# Patient Record
Sex: Male | Born: 1990 | Race: White | Hispanic: No | Marital: Married | Smoking: Former smoker
Health system: Southern US, Community
[De-identification: ages and names within clinical notes are randomized; demographics above are authoritative.]

## PROBLEM LIST (undated history)

## (undated) DIAGNOSIS — F329 Major depressive disorder, single episode, unspecified: Secondary | ICD-10-CM

## (undated) DIAGNOSIS — K219 Gastro-esophageal reflux disease without esophagitis: Secondary | ICD-10-CM

## (undated) DIAGNOSIS — N289 Disorder of kidney and ureter, unspecified: Secondary | ICD-10-CM

## (undated) DIAGNOSIS — F32A Depression, unspecified: Secondary | ICD-10-CM

## (undated) DIAGNOSIS — N200 Calculus of kidney: Secondary | ICD-10-CM

## (undated) DIAGNOSIS — Z87442 Personal history of urinary calculi: Secondary | ICD-10-CM

## (undated) HISTORY — DX: Calculus of kidney: N20.0

## (undated) HISTORY — DX: Major depressive disorder, single episode, unspecified: F32.9

## (undated) HISTORY — DX: Depression, unspecified: F32.A

## (undated) HISTORY — DX: Gastro-esophageal reflux disease without esophagitis: K21.9

---
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
## 2008-02-01 ENCOUNTER — Emergency Department (HOSPITAL_COMMUNITY): Admission: EM | Admit: 2008-02-01 | Discharge: 2008-02-01 | Payer: Self-pay | Admitting: Emergency Medicine

## 2010-11-11 ENCOUNTER — Emergency Department (HOSPITAL_COMMUNITY): Admission: EM | Admit: 2010-11-11 | Discharge: 2010-05-31 | Payer: Self-pay | Admitting: Emergency Medicine

## 2011-04-11 ENCOUNTER — Emergency Department (HOSPITAL_COMMUNITY)
Admission: EM | Admit: 2011-04-11 | Discharge: 2011-04-11 | Disposition: A | Discharge status: Home / Self Care | Payer: No Typology Code available for payment source | Attending: Emergency Medicine | Admitting: Emergency Medicine

## 2011-04-11 DIAGNOSIS — IMO0002 Reserved for concepts with insufficient information to code with codable children: Secondary | ICD-10-CM | POA: Insufficient documentation

## 2011-04-11 DIAGNOSIS — T148XXA Other injury of unspecified body region, initial encounter: Secondary | ICD-10-CM | POA: Insufficient documentation

## 2011-04-11 DIAGNOSIS — Y92410 Unspecified street and highway as the place of occurrence of the external cause: Secondary | ICD-10-CM | POA: Insufficient documentation

## 2011-11-03 ENCOUNTER — Encounter: Payer: Self-pay | Admitting: *Deleted

## 2011-11-03 ENCOUNTER — Emergency Department (HOSPITAL_COMMUNITY)
Admission: EM | Admit: 2011-11-03 | Discharge: 2011-11-03 | Disposition: A | Discharge status: Home / Self Care | Payer: Self-pay | Attending: Emergency Medicine | Admitting: Emergency Medicine

## 2011-11-03 ENCOUNTER — Emergency Department (HOSPITAL_COMMUNITY): Payer: Self-pay

## 2011-11-03 DIAGNOSIS — R0609 Other forms of dyspnea: Secondary | ICD-10-CM | POA: Insufficient documentation

## 2011-11-03 DIAGNOSIS — Z87891 Personal history of nicotine dependence: Secondary | ICD-10-CM | POA: Insufficient documentation

## 2011-11-03 DIAGNOSIS — R072 Precordial pain: Secondary | ICD-10-CM | POA: Insufficient documentation

## 2011-11-03 DIAGNOSIS — R0989 Other specified symptoms and signs involving the circulatory and respiratory systems: Secondary | ICD-10-CM | POA: Insufficient documentation

## 2011-11-03 DIAGNOSIS — J45909 Unspecified asthma, uncomplicated: Secondary | ICD-10-CM | POA: Insufficient documentation

## 2011-11-03 LAB — D-DIMER, QUANTITATIVE: D-Dimer, Quant: 0.4 ug/mL-FEU (ref 0.00–0.48)

## 2011-11-03 NOTE — ED Provider Notes (Signed)
History    20yM with CP. Onset around 0445 today. Woke up from sleep. Substernal pressure with radiation to back. Initially some dyspnea. Symptoms currently resolved. Felt fine when went to bed. Hx of asthma, otherwise healthy. No rash. No trauma. No fever or chills. No cough. No hx of similar symptoms. Denies smoking. Denies drug use.   CSN: [id number] Arrival date & time: 11/03/2011  5:41 AM   None     Chief Complaint  Patient presents with  . Chest Pain    (Consider location/radiation/quality/duration/timing/severity/associated sxs/prior treatment) HPI  Past Medical History  Diagnosis Date  . Asthma     History reviewed. No pertinent past surgical history.  History reviewed. No pertinent family history.  History  Substance Use Topics  . Smoking status: Former Games developer  . Smokeless tobacco: Not on file  . Alcohol Use: Yes      Review of Systems   Review of symptoms negative unless otherwise noted in HPI.   Allergies  Review of patient's allergies indicates no known allergies.  Home Medications  No current outpatient prescriptions on file.  BP 132/83  Pulse 111  Temp(Src) 98.1 F (36.7 C) (Oral)  Resp 18  Ht 5\' 10"  (1.778 m)  Wt 240 lb (108.863 kg)  BMI 34.44 kg/m2  SpO2 100%  Physical Exam  Nursing note and vitals reviewed. Constitutional: He appears well-developed and well-nourished. No distress.  HENT:  Head: Normocephalic and atraumatic.  Eyes: Conjunctivae are normal. Pupils are equal, round, and reactive to light. Right eye exhibits no discharge. Left eye exhibits no discharge. No scleral icterus.  Neck: Neck supple.  Cardiovascular: Normal rate, regular rhythm and normal heart sounds.  Exam reveals no gallop and no friction rub.   No murmur heard. Pulmonary/Chest: Effort normal and breath sounds normal. No respiratory distress. He has no wheezes. He has no rales. He exhibits no tenderness.  Abdominal: Soft. He exhibits no distension. There is  no tenderness.  Musculoskeletal: Normal range of motion. He exhibits no edema and no tenderness.  Neurological: He is alert.  Skin: Skin is warm and dry. No rash noted. No pallor.  Psychiatric: He has a normal mood and affect. His behavior is normal. Thought content normal.    ED Course  Procedures (including critical care time)   Labs Reviewed  D-DIMER, QUANTITATIVE  POCT I-STAT TROPONIN I  I-STAT TROPONIN I   Dg Chest 2 View  11/03/2011  *RADIOLOGY REPORT*  Clinical Data: Chest pain.  CHEST - 2 VIEW  Comparison: None.  Findings: The lungs are well-aerated and clear.  There is no evidence of focal opacification, pleural effusion or pneumothorax.  The heart is normal in size; the mediastinal contour is within normal limits.  No acute osseous abnormalities are seen.  IMPRESSION: No acute cardiopulmonary process seen.  Original Report Authenticated By: Tonia Ghent, M.D.    EKG:  Rhythm: sinus tach Rate: 100 Axis: normal Intervals: normal ST segments: NS ST changes. Inversions in v2/3  1. Chest pain     5:56 AM 20ym with CP and dyspnea, currently resolved. PE considered. Low risk, will dimer. EKG with some nonspecific changes anteriorly. Consider ACS but less likely given young age and no significant risk factors. Possible pneumothorax with hx of asthma. No respiratory distress. Will check CXR. Doubt pneumonia. No prodrome or EKG to suggest myo/pericarditis. Will check trop as screen.  MDM  20yM with CP. Resolved. EKG nondiagnostic. CXR clear. Trop and ddimer wnl. Very low clinical suspicion for ACS. outpt  fu as needed.       Raeford Razor, MD 11/03/11 [phone]

## 2011-11-03 NOTE — ED Notes (Signed)
Pt states cp that woke him from sleep around 0445, pain described as a pressure & had some sob at onset.

## 2013-06-30 ENCOUNTER — Encounter (HOSPITAL_COMMUNITY): Payer: Self-pay | Admitting: *Deleted

## 2013-06-30 ENCOUNTER — Emergency Department (HOSPITAL_COMMUNITY): Payer: BC Managed Care – PPO

## 2013-06-30 ENCOUNTER — Emergency Department (HOSPITAL_COMMUNITY)
Admission: EM | Admit: 2013-06-30 | Discharge: 2013-06-30 | Disposition: A | Discharge status: Home / Self Care | Payer: BC Managed Care – PPO | Attending: Emergency Medicine | Admitting: Emergency Medicine

## 2013-06-30 DIAGNOSIS — K529 Noninfective gastroenteritis and colitis, unspecified: Secondary | ICD-10-CM

## 2013-06-30 DIAGNOSIS — K5289 Other specified noninfective gastroenteritis and colitis: Secondary | ICD-10-CM | POA: Insufficient documentation

## 2013-06-30 DIAGNOSIS — Z87891 Personal history of nicotine dependence: Secondary | ICD-10-CM | POA: Insufficient documentation

## 2013-06-30 DIAGNOSIS — J45909 Unspecified asthma, uncomplicated: Secondary | ICD-10-CM | POA: Insufficient documentation

## 2013-06-30 DIAGNOSIS — R109 Unspecified abdominal pain: Secondary | ICD-10-CM

## 2013-06-30 DIAGNOSIS — R112 Nausea with vomiting, unspecified: Secondary | ICD-10-CM | POA: Insufficient documentation

## 2013-06-30 DIAGNOSIS — R197 Diarrhea, unspecified: Secondary | ICD-10-CM | POA: Insufficient documentation

## 2013-06-30 LAB — CBC WITH DIFFERENTIAL/PLATELET
Lymphocytes Relative: 10 % — ABNORMAL LOW (ref 12–46)
Lymphs Abs: 2.6 10*3/uL (ref 0.7–4.0)
MCHC: 35.6 g/dL (ref 30.0–36.0)
Monocytes Absolute: 2 10*3/uL — ABNORMAL HIGH (ref 0.1–1.0)
Monocytes Relative: 8 % (ref 3–12)
Neutro Abs: 20.9 10*3/uL — ABNORMAL HIGH (ref 1.7–7.7)
Neutrophils Relative %: 80 % — ABNORMAL HIGH (ref 43–77)
Platelets: 262 10*3/uL (ref 150–400)
RDW: 12.2 % (ref 11.5–15.5)
WBC: 26 10*3/uL — ABNORMAL HIGH (ref 4.0–10.5)

## 2013-06-30 LAB — COMPREHENSIVE METABOLIC PANEL
AST: 18 U/L (ref 0–37)
CO2: 28 mEq/L (ref 19–32)
Calcium: 9.8 mg/dL (ref 8.4–10.5)
Chloride: 100 mEq/L (ref 96–112)
Creatinine, Ser: 0.95 mg/dL (ref 0.50–1.35)
Glucose, Bld: 109 mg/dL — ABNORMAL HIGH (ref 70–99)
Total Protein: 8.1 g/dL (ref 6.0–8.3)

## 2013-06-30 LAB — LIPASE, BLOOD: Lipase: 23 U/L (ref 11–59)

## 2013-06-30 MED ORDER — IOHEXOL 300 MG/ML  SOLN
100.0000 mL | Freq: Once | INTRAMUSCULAR | Status: AC | PRN
  Administered 2013-06-30: 100 mL via INTRAVENOUS

## 2013-06-30 MED ORDER — KETOROLAC TROMETHAMINE 30 MG/ML IJ SOLN
30.0000 mg | Freq: Once | INTRAMUSCULAR | Status: AC
  Administered 2013-06-30: 30 mg via INTRAVENOUS
  Filled 2013-06-30: qty 1

## 2013-06-30 MED ORDER — ONDANSETRON HCL 4 MG/2ML IJ SOLN
4.0000 mg | Freq: Once | INTRAMUSCULAR | Status: AC
  Administered 2013-06-30: 4 mg via INTRAVENOUS
  Filled 2013-06-30: qty 2

## 2013-06-30 MED ORDER — IOHEXOL 300 MG/ML  SOLN
50.0000 mL | Freq: Once | INTRAMUSCULAR | Status: AC | PRN
  Administered 2013-06-30: 50 mL via ORAL

## 2013-06-30 MED ORDER — ONDANSETRON 8 MG PO TBDP: ORAL_TABLET | ORAL | Status: DC

## 2013-06-30 NOTE — ED Provider Notes (Signed)
  CSN: [id number]     Arrival date & time 06/30/13  0226 History     First MD Initiated Contact with Patient 06/30/13 0249     Chief Complaint  Patient presents with  . Abdominal Pain  . Abdominal Cramping  . Emesis   (Consider location/radiation/quality/duration/timing/severity/associated sxs/prior Treatment) HPI Comments: Patient woke tonight with abdominal cramping, n/v/d.  The cramping is located in the epigastric region.  No fevers or chills.  No urinary complaints.  No ill contacts.    Patient is a 22 y.o. male presenting with abdominal pain. The history is provided by the patient.  Abdominal Pain This is a new problem. The current episode started 3 to 5 hours ago. The problem occurs constantly. The problem has not changed since onset.Nothing aggravates the symptoms. Nothing relieves the symptoms. He has tried nothing for the symptoms. The treatment provided no relief.    Past Medical History  Diagnosis Date  . Asthma    History reviewed. No pertinent past surgical history. No family history on file. History  Substance Use Topics  . Smoking status: Former Games developer  . Smokeless tobacco: Not on file  . Alcohol Use: No    Review of Systems  All other systems reviewed and are negative.    Allergies  Review of patient's allergies indicates no known allergies.  Home Medications  No current outpatient prescriptions on file. BP 122/65  Pulse 98  Temp(Src) 98.2 F (36.8 C) (Oral)  Ht 5\' 9"  (1.753 m)  Wt 215 lb (97.523 kg)  BMI 31.74 kg/m2  SpO2 98% Physical Exam  Nursing note and vitals reviewed. Constitutional: He is oriented to person, place, and time. He appears well-developed and well-nourished. No distress.  HENT:  Head: Normocephalic and atraumatic.  Mouth/Throat: Oropharynx is clear and moist.  Neck: Normal range of motion. Neck supple.  Cardiovascular: Normal rate, regular rhythm and normal heart sounds.   No murmur heard. Pulmonary/Chest: Effort normal  and breath sounds normal. No respiratory distress. He has no wheezes.  Abdominal: Soft. Bowel sounds are normal. He exhibits no distension and no mass. There is tenderness.  There is ttp in the epigastric region, left, and right upper quadrants.  No rebound or guarding.    Musculoskeletal: Normal range of motion. He exhibits no edema.  Neurological: He is alert and oriented to person, place, and time.  Skin: Skin is warm and dry. He is not diaphoretic.    ED Course   Procedures (including critical care time)  Labs Reviewed  CBC WITH DIFFERENTIAL  COMPREHENSIVE METABOLIC PANEL  LIPASE, BLOOD   No results found. No diagnosis found.  MDM  Patient presents with complaints of abdominal pain, nausea, vomiting, and loose stools.  The abdominal exam is benign, however the workup reveals a wbc of 26k.  He appears clinically well, however due to the degree of leukocytosis, I felt as though a ct scan was indicated to rule any serious intra-abdominal pathology.  This was performed and was negative.  It appears as though this is a gastroenteritis.  Will discharge to home with zofran, return prn.    Geoffery Lyons, MD 06/30/13 [phone]

## 2013-06-30 NOTE — ED Notes (Signed)
Pt complains of abdominal pain w/ cramping. Also pt states he has been vomiting.

## 2013-06-30 NOTE — ED Notes (Signed)
Pt alert & oriented x4, stable gait. Patient given discharge instructions, paperwork & prescription(s). Patient  instructed to stop at the registration desk to finish any additional paperwork. Patient verbalized understanding. Pt left department w/ no further questions. 

## 2014-01-31 ENCOUNTER — Encounter: Payer: Self-pay | Admitting: *Deleted

## 2014-01-31 DIAGNOSIS — K5289 Other specified noninfective gastroenteritis and colitis: Secondary | ICD-10-CM | POA: Insufficient documentation

## 2014-01-31 DIAGNOSIS — R079 Chest pain, unspecified: Secondary | ICD-10-CM | POA: Insufficient documentation

## 2014-02-03 ENCOUNTER — Encounter: Payer: Self-pay | Admitting: Cardiology

## 2014-02-03 ENCOUNTER — Ambulatory Visit (INDEPENDENT_AMBULATORY_CARE_PROVIDER_SITE_OTHER): Payer: BC Managed Care – PPO | Admitting: Cardiology

## 2014-02-03 ENCOUNTER — Encounter: Payer: Self-pay | Admitting: *Deleted

## 2014-02-03 VITALS — BP 138/79 | HR 63 | Ht 69.0 in | Wt 226.8 lb

## 2014-02-03 DIAGNOSIS — R079 Chest pain, unspecified: Secondary | ICD-10-CM

## 2014-02-03 DIAGNOSIS — I10 Essential (primary) hypertension: Secondary | ICD-10-CM

## 2014-02-03 NOTE — Progress Notes (Signed)
     Clinical Summary Ernest Cochran is a 22 y.o.male seen today as a new patient, he is referred for chest pain  1. Chest pain - started approx 10 year ago. Pressure like feeling in midchest, 6-8/10. +SOB + Dizziness. Can feel palpitations at times.  Can occur at rest rarely, typically occurs with exertion. Worst with deep breaths. Pain lasts approx 20 minutes. Occurs 3-4 times a week. Notes increasing frequency over last few months. - nexium started approx 1 week ago without siginficant improvement. - fairly sedentary lifestyle, no high levels exertion. Describes DOE with short distances.  CAD risk factors: Remote tobacco history, now using electronic cigarettes. No drug use.  Mother MI around age 7540s, maternal grandfather with heart troubles    Past Medical History  Diagnosis Date  . Asthma      No Known Allergies   Current Outpatient Prescriptions  Medication Sig Dispense Refill  . ondansetron (ZOFRAN ODT) 8 MG disintegrating tablet 8mg  ODT q4 hours prn nausea  4 tablet  0   No current facility-administered medications for this visit.     No past surgical history on file.   No Known Allergies    No family history on file.   Social History Ernest Cochran reports that he has quit smoking. He does not have any smokeless tobacco history on file. Ernest Cochran reports that he does not drink alcohol.   Review of Systems CONSTITUTIONAL: No weight loss, fever, chills, weakness or fatigue.  HEENT: Eyes: No visual loss, blurred vision, double vision or yellow sclerae.No hearing loss, sneezing, congestion, runny nose or sore throat.  SKIN: No rash or itching.  CARDIOVASCULAR: per HPI RESPIRATORY: per HPI GASTROINTESTINAL: No anorexia, nausea, vomiting or diarrhea. No abdominal pain or blood.  GENITOURINARY: No burning on urination, no polyuria NEUROLOGICAL: No headache, dizziness, syncope, paralysis, ataxia, numbness or tingling in the extremities. No change in bowel or bladder  control.  MUSCULOSKELETAL: No muscle, back pain, joint pain or stiffness.  LYMPHATICS: No enlarged nodes. No history of splenectomy.  PSYCHIATRIC: No history of depression or anxiety.  ENDOCRINOLOGIC: No reports of sweating, cold or heat intolerance. No polyuria or polydipsia.  Marland Kitchen.   Physical Examination p 63 bp 124/70  Wt 226 lbs BMI 33 Gen: resting comfortably, no acute distress HEENT: no scleral icterus, pupils equal round and reactive, no palptable cervical adenopathy,  CV: RRR, no m/r/g, no JVD, no carotid bruits Resp: Clear to auscultation bilaterally GI: abdomen is soft, non-tender, non-distended, normal bowel sounds, no hepatosplenomegaly MSK: extremities are warm, no edema.  Skin: warm, no rash Neuro:  no focal deficits Psych: appropriate affect   Diagnostic Studies 02/03/14 Clinic EKG: NSR, non-spec ST/T changes    Assessment and Plan  1. Chest pain - young patient with fairly strong family history of CAD - symptoms are progressing over the last several years with significant exertional component. Given age fairly low risk of obstructive CAD but he does have some family hx, also potential for possible congenital coronary anomaly or coronary vasospasm.  - will further evaluate with echo and stress echo - check fasting lipid panel and HgbA1c for further risk assessment      Antoine PocheJonathan F. Laykin Rainone, M.D., F.A.C.C.

## 2014-02-03 NOTE — Patient Instructions (Signed)
Your physician recommends that you schedule a follow-up appointment in: 1 month with Dr Wyline MoodBranch  Your physician has requested that you have an echocardiogram. Echocardiography is a painless test that uses sound waves to create images of your heart. It provides your doctor with information about the size and shape of your heart and how well your heart's chambers and valves are working. This procedure takes approximately one hour. There are no restrictions for this procedure.  Your physician has requested that you have a stress echocardiogram. For further information please visit [email or web address]. Please follow instruction sheet as given.   Your physician recommends that you return for lab work in a week. Lipid, Hgba1c

## 2014-02-07 ENCOUNTER — Ambulatory Visit (HOSPITAL_COMMUNITY)
Admission: RE | Admit: 2014-02-07 | Discharge: 2014-02-07 | Disposition: A | Discharge status: Home / Self Care | Payer: Self-pay | Source: Ambulatory Visit | Attending: Cardiology | Admitting: Cardiology

## 2014-02-07 ENCOUNTER — Encounter (HOSPITAL_COMMUNITY): Payer: Self-pay

## 2014-02-07 DIAGNOSIS — I10 Essential (primary) hypertension: Secondary | ICD-10-CM | POA: Insufficient documentation

## 2014-02-07 DIAGNOSIS — R0989 Other specified symptoms and signs involving the circulatory and respiratory systems: Secondary | ICD-10-CM | POA: Insufficient documentation

## 2014-02-07 DIAGNOSIS — Z8249 Family history of ischemic heart disease and other diseases of the circulatory system: Secondary | ICD-10-CM | POA: Insufficient documentation

## 2014-02-07 DIAGNOSIS — F17200 Nicotine dependence, unspecified, uncomplicated: Secondary | ICD-10-CM | POA: Insufficient documentation

## 2014-02-07 DIAGNOSIS — R079 Chest pain, unspecified: Secondary | ICD-10-CM | POA: Insufficient documentation

## 2014-02-07 DIAGNOSIS — R0609 Other forms of dyspnea: Secondary | ICD-10-CM | POA: Insufficient documentation

## 2014-02-07 DIAGNOSIS — R072 Precordial pain: Secondary | ICD-10-CM

## 2014-02-07 DIAGNOSIS — Z6833 Body mass index (BMI) 33.0-33.9, adult: Secondary | ICD-10-CM | POA: Insufficient documentation

## 2014-02-07 NOTE — Progress Notes (Signed)
*  PRELIMINARY RESULTS* Echocardiogram Echocardiogram Stress Test has been performed.  Ernest Cochran 02/07/2014, 11:18 AM

## 2014-02-07 NOTE — Progress Notes (Signed)
Stress Lab Nurses Notes - [facility]  Doris CheadleKenneth W Hoes 02/07/2014 Reason for doing test: Chest Pain and HTN Type of test: Stress Echo Nurse performing test: Parke PoissonPhyllis Billingsly, RN Nuclear Medicine Tech: Not Applicable Echo Tech: Veda Canningindy Rigg MD performing test: S. McDowell/K.Lawrence NP Family MD: Creta LevinStallings Test explained and consent signed: yes IV started: No IV started Symptoms: SOB Treatment/Intervention: None Reason test stopped: fatigue and reached target HR After recovery IV was: NA Patient to return to Nuc. Med at : NA Patient discharged: Home Patient's Condition upon discharge was: stable Comments: During test peak BP 146/56 & HR 179.  Recovery BP 122/73 & HR 99.  Symptoms resolved in recovery. Erskine SpeedBillingsley, Keric Zehren T

## 2014-02-10 ENCOUNTER — Ambulatory Visit (HOSPITAL_COMMUNITY)
Admission: RE | Admit: 2014-02-10 | Discharge: 2014-02-10 | Disposition: A | Discharge status: Home / Self Care | Payer: Self-pay | Source: Ambulatory Visit | Attending: Cardiology | Admitting: Cardiology

## 2014-02-10 DIAGNOSIS — R079 Chest pain, unspecified: Secondary | ICD-10-CM | POA: Insufficient documentation

## 2014-02-10 DIAGNOSIS — I10 Essential (primary) hypertension: Secondary | ICD-10-CM | POA: Insufficient documentation

## 2014-02-10 DIAGNOSIS — Z6832 Body mass index (BMI) 32.0-32.9, adult: Secondary | ICD-10-CM | POA: Insufficient documentation

## 2014-02-10 NOTE — Progress Notes (Signed)
*  PRELIMINARY RESULTS* Echocardiogram 2D Echocardiogram has been performed.  Ernest Cochran 02/10/2014, 1:19 PM

## 2014-03-12 ENCOUNTER — Ambulatory Visit: Payer: Self-pay | Admitting: Cardiology

## 2014-03-12 ENCOUNTER — Encounter: Payer: Self-pay | Admitting: Cardiology

## 2014-05-15 ENCOUNTER — Emergency Department (HOSPITAL_COMMUNITY)
Admission: EM | Admit: 2014-05-15 | Discharge: 2014-05-15 | Disposition: A | Discharge status: Home / Self Care | Payer: Self-pay | Attending: Emergency Medicine | Admitting: Emergency Medicine

## 2014-05-15 ENCOUNTER — Encounter (HOSPITAL_COMMUNITY): Payer: Self-pay | Admitting: Emergency Medicine

## 2014-05-15 DIAGNOSIS — M5380 Other specified dorsopathies, site unspecified: Secondary | ICD-10-CM | POA: Insufficient documentation

## 2014-05-15 DIAGNOSIS — Z87891 Personal history of nicotine dependence: Secondary | ICD-10-CM | POA: Insufficient documentation

## 2014-05-15 DIAGNOSIS — M62830 Muscle spasm of back: Secondary | ICD-10-CM

## 2014-05-15 DIAGNOSIS — Z79899 Other long term (current) drug therapy: Secondary | ICD-10-CM | POA: Insufficient documentation

## 2014-05-15 DIAGNOSIS — M5430 Sciatica, unspecified side: Secondary | ICD-10-CM | POA: Insufficient documentation

## 2014-05-15 DIAGNOSIS — M5432 Sciatica, left side: Secondary | ICD-10-CM

## 2014-05-15 DIAGNOSIS — J45909 Unspecified asthma, uncomplicated: Secondary | ICD-10-CM | POA: Insufficient documentation

## 2014-05-15 MED ORDER — HYDROCODONE-ACETAMINOPHEN 5-325 MG PO TABS
1.0000 | ORAL_TABLET | Freq: Once | ORAL | Status: AC
  Administered 2014-05-15: 1 via ORAL
  Filled 2014-05-15: qty 1

## 2014-05-15 MED ORDER — HYDROCODONE-ACETAMINOPHEN 5-325 MG PO TABS: 2.0000 | ORAL_TABLET | ORAL | Status: DC | PRN

## 2014-05-15 MED ORDER — METHOCARBAMOL 500 MG PO TABS: 500.0000 mg | ORAL_TABLET | Freq: Three times a day (TID) | ORAL | Status: DC | PRN

## 2014-05-15 MED ORDER — IBUPROFEN 600 MG PO TABS: 600.0000 mg | ORAL_TABLET | Freq: Four times a day (QID) | ORAL | Status: DC | PRN

## 2014-05-15 MED ORDER — KETOROLAC TROMETHAMINE 60 MG/2ML IM SOLN
60.0000 mg | Freq: Once | INTRAMUSCULAR | Status: AC
  Administered 2014-05-15: 60 mg via INTRAMUSCULAR
  Filled 2014-05-15: qty 2

## 2014-05-15 MED ORDER — METHOCARBAMOL 500 MG PO TABS
500.0000 mg | ORAL_TABLET | Freq: Once | ORAL | Status: AC
  Administered 2014-05-15: 500 mg via ORAL
  Filled 2014-05-15: qty 1

## 2014-05-15 NOTE — Discharge Instructions (Signed)
Lumbosacral Strain °Lumbosacral strain is a strain of any of the parts that make up your lumbosacral vertebrae. Your lumbosacral vertebrae are the bones that make up the lower third of your backbone. Your lumbosacral vertebrae are held together by muscles and tough, fibrous tissue (ligaments).  °CAUSES  °A sudden blow to your back can cause lumbosacral strain. Also, anything that causes an excessive stretch of the muscles in the low back can cause this strain. This is typically seen when people exert themselves strenuously, fall, lift heavy objects, bend, or crouch repeatedly. °RISK FACTORS °· Physically demanding work. °· Participation in pushing or pulling sports or sports that require sudden twist of the back (tennis, golf, baseball). °· Weight lifting. °· Excessive lower back curvature. °· Forward-tilted pelvis. °· Weak back or abdominal muscles or both. °· Tight hamstrings. °SIGNS AND SYMPTOMS  °Lumbosacral strain may cause pain in the area of your injury or pain that moves (radiates) down your leg.  °DIAGNOSIS °Your health care provider can often diagnose lumbosacral strain through a physical exam. In some cases, you may need tests such as X-ray exams.  °TREATMENT  °Treatment for your lower back injury depends on many factors that your clinician will have to evaluate. However, most treatment will include the use of anti-inflammatory medicines. °HOME CARE INSTRUCTIONS  °· Avoid hard physical activities (tennis, racquetball, waterskiing) if you are not in proper physical condition for it. This may aggravate or create problems. °· If you have a back problem, avoid sports requiring sudden body movements. Swimming and walking are generally safer activities. °· Maintain good posture. °· Maintain a healthy weight. °· For acute conditions, you may put ice on the injured area. °· Put ice in a plastic bag. °· Place a towel between your skin and the bag. °· Leave the ice on for 20 minutes, 2 3 times a day. °· When the  low back starts healing, stretching and strengthening exercises may be recommended. °SEEK MEDICAL CARE IF: °· Your back pain is getting worse. °· You experience severe back pain not relieved with medicines. °SEEK IMMEDIATE MEDICAL CARE IF:  °· You have numbness, tingling, weakness, or problems with the use of your arms or legs. °· There is a change in bowel or bladder control. °· You have increasing pain in any area of the body, including your belly (abdomen). °· You notice shortness of breath, dizziness, or feel faint. °· You feel sick to your stomach (nauseous), are throwing up (vomiting), or become sweaty. °· You notice discoloration of your toes or legs, or your feet get very cold. °MAKE SURE YOU:  °· Understand these instructions. °· Will watch your condition. °· Will get help right away if you are not doing well or get worse. °Document Released: 08/31/2005 Document Revised: 09/11/2013 Document Reviewed: 07/10/2013 °ExitCare® Patient Information ©2014 [employer]. ° °Sciatica °Sciatica is pain, weakness, numbness, or tingling along the path of the sciatic nerve. The nerve starts in the lower back and runs down the back of each leg. The nerve controls the muscles in the lower leg and in the back of the knee, while also providing sensation to the back of the thigh, lower leg, and the sole of your foot. Sciatica is a symptom of another medical condition. For instance, nerve damage or certain conditions, such as a herniated disk or bone spur on the spine, pinch or put pressure on the sciatic nerve. This causes the pain, weakness, or other sensations normally associated with sciatica. Generally, sciatica only affects   one side of the body. °CAUSES  °· Herniated or slipped disc. °· Degenerative disk disease. °· A pain disorder involving the narrow muscle in the buttocks (piriformis syndrome). °· Pelvic injury or fracture. °· Pregnancy. °· Tumor (rare). °SYMPTOMS  °Symptoms can vary from mild to very severe. The  symptoms usually travel from the low back to the buttocks and down the back of the leg. Symptoms can include: °· Mild tingling or dull aches in the lower back, leg, or hip. °· Numbness in the back of the calf or sole of the foot. °· Burning sensations in the lower back, leg, or hip. °· Sharp pains in the lower back, leg, or hip. °· Leg weakness. °· Severe back pain inhibiting movement. °These symptoms may get worse with coughing, sneezing, laughing, or prolonged sitting or standing. Also, being overweight may worsen symptoms. °DIAGNOSIS  °Your caregiver will perform a physical exam to look for common symptoms of sciatica. He or she may ask you to do certain movements or activities that would trigger sciatic nerve pain. Other tests may be performed to find the cause of the sciatica. These may include: °· Blood tests. °· X-rays. °· Imaging tests, such as an MRI or CT scan. °TREATMENT  °Treatment is directed at the cause of the sciatic pain. Sometimes, treatment is not necessary and the pain and discomfort goes away on its own. If treatment is needed, your caregiver may suggest: °· Over-the-counter medicines to relieve pain. °· Prescription medicines, such as anti-inflammatory medicine, muscle relaxants, or narcotics. °· Applying heat or ice to the painful area. °· Steroid injections to lessen pain, irritation, and inflammation around the nerve. °· Reducing activity during periods of pain. °· Exercising and stretching to strengthen your abdomen and improve flexibility of your spine. Your caregiver may suggest losing weight if the extra weight makes the back pain worse. °· Physical therapy. °· Surgery to eliminate what is pressing or pinching the nerve, such as a bone spur or part of a herniated disk. °HOME CARE INSTRUCTIONS  °· Only take over-the-counter or prescription medicines for pain or discomfort as directed by your caregiver. °· Apply ice to the affected area for 20 minutes, 3 4 times a day for the first 48 72  hours. Then try heat in the same way. °· Exercise, stretch, or perform your usual activities if these do not aggravate your pain. °· Attend physical therapy sessions as directed by your caregiver. °· Keep all follow-up appointments as directed by your caregiver. °· Do not wear high heels or shoes that do not provide proper support. °· Check your mattress to see if it is too soft. A firm mattress may lessen your pain and discomfort. °SEEK IMMEDIATE MEDICAL CARE IF:  °· You lose control of your bowel or bladder (incontinence). °· You have increasing weakness in the lower back, pelvis, buttocks, or legs. °· You have redness or swelling of your back. °· You have a burning sensation when you urinate. °· You have pain that gets worse when you lie down or awakens you at night. °· Your pain is worse than you have experienced in the past. °· Your pain is lasting longer than 4 weeks. °· You are suddenly losing weight without reason. °MAKE SURE YOU: °· Understand these instructions. °· Will watch your condition. °· Will get help right away if you are not doing well or get worse. °Document Released: 11/15/2001 Document Revised: 05/22/2012 Document Reviewed: 04/01/2012 °ExitCare® Patient Information ©2014 [employer]. ° °

## 2014-05-15 NOTE — ED Provider Notes (Signed)
CSN: [id number]     Arrival date & time 05/15/14  0226 History   First MD Initiated Contact with Patient 05/15/14 0233     Chief Complaint  Patient presents with  . Back Pain     (Consider location/radiation/quality/duration/timing/severity/associated sxs/prior Treatment) HPI Patient presents with left-sided low back pain for roughly one month after renovating a porch. Patient states the pain goes down the back of his leg all the way to his left knee. He has no numbness or weakness. He has no swelling or color change. He denies any bowel or bladder incontinence. He's had no fevers or chills. He's had no direct trauma. Past Medical History  Diagnosis Date  . Asthma    History reviewed. No pertinent past surgical history. No family history on file. History  Substance Use Topics  . Smoking status: Former Games developer  . Smokeless tobacco: Not on file  . Alcohol Use: No    Review of Systems  Constitutional: Negative for fever and chills.  Gastrointestinal: Negative for nausea and vomiting.  Genitourinary: Negative for difficulty urinating.  Musculoskeletal: Positive for back pain and myalgias. Negative for neck pain and neck stiffness.  Skin: Negative for rash and wound.  Neurological: Negative for weakness and numbness.  All other systems reviewed and are negative.     Allergies  Review of patient's allergies indicates no known allergies.  Home Medications   Prior to Admission medications   Medication Sig Start Date End Date Taking? Authorizing Provider  Esomeprazole Magnesium (NEXIUM PO) Take by mouth daily.    Historical Provider, MD  HYDROcodone-acetaminophen (NORCO) 5-325 MG per tablet Take 2 tablets by mouth every 4 (four) hours as needed. 05/15/14   Loren Racer, MD  ibuprofen (ADVIL,MOTRIN) 600 MG tablet Take 1 tablet (600 mg total) by mouth every 6 (six) hours as needed. 05/15/14   Loren Racer, MD  methocarbamol (ROBAXIN) 500 MG tablet Take 1 tablet (500 mg total)  by mouth every 8 (eight) hours as needed for muscle spasms. 05/15/14   Loren Racer, MD   BP 133/78  Pulse 86  Temp(Src) 98 F (36.7 C) (Oral)  Resp 18  Ht 5\' 10"  (1.778 m)  Wt 220 lb (99.791 kg)  BMI 31.57 kg/m2  SpO2 100% Physical Exam  Nursing note and vitals reviewed. Constitutional: He is oriented to person, place, and time. He appears well-developed and well-nourished. No distress.  HENT:  Head: Normocephalic.  Mouth/Throat: Oropharynx is clear and moist.  Neck: Normal range of motion. Neck supple.  Cardiovascular: Normal rate and regular rhythm.   Pulmonary/Chest: Effort normal.  Abdominal: Soft.  Musculoskeletal: Normal range of motion. He exhibits tenderness (Tenderness to palpation in the low left lumbar paraspinal muscles. Spasm also noted.). He exhibits no edema.  Negative straight leg raise. Dorsalis pedis pulses bilaterally intact. No calf tenderness or swelling.  Neurological: He is alert and oriented to person, place, and time.  Patient is alert and oriented x3 with clear, goal oriented speech. Patient has 5/5 motor in all extremities. Sensation is intact to light touch. Patient has a normal gait and walks without assistance.   Skin: Skin is warm and dry. No rash noted. No erythema.  Psychiatric: He has a normal mood and affect. His behavior is normal.    ED Course  Procedures (including critical care time) Labs Review Labs Reviewed - No data to display  Imaging Review No results found.   EKG Interpretation None      MDM   Final diagnoses:  Lumbar paraspinal muscle spasm  Sciatica of left side   No red flag signs or symptoms. Normal neurologic exam. Imaging is not necessary at this point. Appears patient is having lumbar spasms and likely has sciatica. We'll treat symptomatically. Return precautions given.    Loren Raceravid Wyland Rastetter, MD 05/15/14 [phone]

## 2014-05-15 NOTE — ED Notes (Signed)
Patient c/o lower back pain that radiates down left leg.  Patient states back hurts worse when he stands on left leg.

## 2014-07-09 ENCOUNTER — Encounter (HOSPITAL_COMMUNITY): Payer: Self-pay | Admitting: Emergency Medicine

## 2014-07-09 DIAGNOSIS — Z87891 Personal history of nicotine dependence: Secondary | ICD-10-CM | POA: Insufficient documentation

## 2014-07-09 DIAGNOSIS — Z79899 Other long term (current) drug therapy: Secondary | ICD-10-CM | POA: Insufficient documentation

## 2014-07-09 DIAGNOSIS — M549 Dorsalgia, unspecified: Secondary | ICD-10-CM | POA: Insufficient documentation

## 2014-07-09 DIAGNOSIS — IMO0002 Reserved for concepts with insufficient information to code with codable children: Secondary | ICD-10-CM | POA: Insufficient documentation

## 2014-07-09 DIAGNOSIS — J45909 Unspecified asthma, uncomplicated: Secondary | ICD-10-CM | POA: Insufficient documentation

## 2014-07-09 NOTE — ED Notes (Signed)
Pt reports lower back pain the radiating down the left leg for the past month and half. Pt states unable to take pain & has not been seen by PCP.

## 2014-07-10 ENCOUNTER — Emergency Department (HOSPITAL_COMMUNITY)
Admission: EM | Admit: 2014-07-10 | Discharge: 2014-07-10 | Disposition: A | Discharge status: Home / Self Care | Payer: No Typology Code available for payment source | Attending: Emergency Medicine | Admitting: Emergency Medicine

## 2014-07-10 DIAGNOSIS — M5416 Radiculopathy, lumbar region: Secondary | ICD-10-CM

## 2014-07-10 MED ORDER — HYDROCODONE-ACETAMINOPHEN 5-325 MG PO TABS
1.0000 | ORAL_TABLET | Freq: Once | ORAL | Status: AC
  Administered 2014-07-10: 1 via ORAL

## 2014-07-10 MED ORDER — HYDROCODONE-ACETAMINOPHEN 5-325 MG PO TABS: 1.0000 | ORAL_TABLET | Freq: Three times a day (TID) | ORAL | Status: DC | PRN

## 2014-07-10 MED ORDER — HYDROCODONE-ACETAMINOPHEN 5-325 MG PO TABS
ORAL_TABLET | ORAL | Status: AC
  Filled 2014-07-10: qty 1

## 2014-07-10 MED ORDER — PREDNISONE 20 MG PO TABS: ORAL_TABLET | ORAL | Status: DC

## 2014-07-10 NOTE — ED Provider Notes (Signed)
CSN: [id number]     Arrival date & time 07/09/14  2345 History   First MD Initiated Contact with Patient 07/10/14 0321     Chief Complaint  Patient presents with  . Back Pain     (Consider location/radiation/quality/duration/timing/severity/associated sxs/prior Treatment) HPI 2 months constant severe positional low back pain across his entire low back radiating down his left leg to his thigh and occasionally down to his left foot with occasional numbness and weakness to the left leg lasting for hours at a time but not present tonight, tonight his pain is in his low back radiating to his left thigh but not below his knee without weakness or numbness and he has had no change in bowel or bladder function; no fever no trauma no rash no IV drug abuse  Diffuse lumbar tenderness  Past Medical History  Diagnosis Date  . Asthma    History reviewed. No pertinent past surgical history. No family history on file. History  Substance Use Topics  . Smoking status: Former Games developermoker  . Smokeless tobacco: Not on file  . Alcohol Use: No    Review of Systems  10 Systems reviewed and are negative for acute change except as noted in the HPI.  Allergies  Review of patient's allergies indicates no known allergies.  Home Medications   Prior to Admission medications   Medication Sig Start Date End Date Taking? Authorizing Provider  Esomeprazole Magnesium (NEXIUM PO) Take by mouth daily.    Historical Provider, MD  HYDROcodone-acetaminophen (NORCO) 5-325 MG per tablet Take 2 tablets by mouth every 4 (four) hours as needed. 05/15/14   Loren Raceravid Yelverton, MD  HYDROcodone-acetaminophen (NORCO) 5-325 MG per tablet Take 1-2 tablets by mouth every 8 (eight) hours as needed for severe pain. 07/10/14   Hurman HornJohn M Rianna Lukes, MD  ibuprofen (ADVIL,MOTRIN) 600 MG tablet Take 1 tablet (600 mg total) by mouth every 6 (six) hours as needed. 05/15/14   Loren Raceravid Yelverton, MD  methocarbamol (ROBAXIN) 500 MG tablet Take 1 tablet (500 mg  total) by mouth every 8 (eight) hours as needed for muscle spasms. 05/15/14   Loren Raceravid Yelverton, MD  predniSONE (DELTASONE) 20 MG tablet 3 tabs po day one, then 2 tabs daily x 4 days 07/10/14   Hurman HornJohn M Elizah Mierzwa, MD   BP 123/79  Pulse 79  Temp(Src) 99.3 F (37.4 C) (Oral)  Resp 20  Ht 5\' 10"  (1.778 m)  Wt 223 lb (101.152 kg)  BMI 32.00 kg/m2 Physical Exam  Nursing note and vitals reviewed. Constitutional:  Awake, alert, nontoxic appearance with baseline speech.  HENT:  Head: Atraumatic.  Eyes: Pupils are equal, round, and reactive to light. Right eye exhibits no discharge. Left eye exhibits no discharge.  Neck: Neck supple.  Cardiovascular: Normal rate and regular rhythm.   No murmur heard. Pulmonary/Chest: Effort normal and breath sounds normal. No respiratory distress. He has no wheezes. He has no rales. He exhibits no tenderness.  Abdominal: Soft. Bowel sounds are normal. He exhibits no mass. There is no tenderness. There is no rebound.  Musculoskeletal:       Thoracic back: He exhibits no tenderness.       Lumbar back: He exhibits no tenderness.  Diffuse lumbar tenderness. Bilateral lower extremities non tender without new rashes or color change, baseline ROM with intact DP pulses, CR<2 secs all digits bilaterally, sensation baseline light touch bilaterally for pt, DTR's symmetric and intact bilaterally KJ / AJ, motor symmetric bilateral 5 / 5 hip flexion, quadriceps, hamstrings, EHL,  foot dorsiflexion, foot plantarflexion, gait somewhat antalgic but without apparent new ataxia.  Neurological: He is alert.  Mental status baseline for patient.  Upper extremity motor strength and sensation intact and symmetric bilaterally.  Skin: No rash noted.  Psychiatric: He has a normal mood and affect.    ED Course  Procedures (including critical care time) Labs Review Labs Reviewed - No data to display  Imaging Review No results found.   EKG Interpretation None      MDM   Final  diagnoses:  Lumbar radiculopathy, acute    Patient / Family / Caregiver informed of clinical course, understand medical decision-making process, and agree with plan.  I doubt any other EMC precluding discharge at this time including, but not necessarily limited to the following:SBI, AAA, cauda equina.    Hurman Horn, MD 07/12/14 [phone]

## 2014-07-10 NOTE — Discharge Instructions (Signed)
SEEK IMMEDIATE MEDICAL ATTENTION IF: New numbness, tingling, weakness, or problem with the use of your arms or legs.  Severe back pain not relieved with medications.  Change in bowel or bladder control.  Increasing pain in any areas of the body (such as chest or abdominal pain).  Shortness of breath, dizziness or fainting.  Nausea (feeling sick to your stomach), vomiting, fever, or sweats.  

## 2014-11-15 ENCOUNTER — Emergency Department (HOSPITAL_COMMUNITY)
Admission: EM | Admit: 2014-11-15 | Discharge: 2014-11-15 | Disposition: A | Discharge status: Home / Self Care | Payer: Medicaid Other | Attending: Emergency Medicine | Admitting: Emergency Medicine

## 2014-11-15 ENCOUNTER — Encounter (HOSPITAL_COMMUNITY): Payer: Self-pay | Admitting: Emergency Medicine

## 2014-11-15 ENCOUNTER — Emergency Department (HOSPITAL_COMMUNITY): Payer: Medicaid Other

## 2014-11-15 DIAGNOSIS — Z79899 Other long term (current) drug therapy: Secondary | ICD-10-CM | POA: Insufficient documentation

## 2014-11-15 DIAGNOSIS — Y998 Other external cause status: Secondary | ICD-10-CM | POA: Diagnosis not present

## 2014-11-15 DIAGNOSIS — S3992XA Unspecified injury of lower back, initial encounter: Secondary | ICD-10-CM | POA: Diagnosis present

## 2014-11-15 DIAGNOSIS — M5440 Lumbago with sciatica, unspecified side: Secondary | ICD-10-CM | POA: Insufficient documentation

## 2014-11-15 DIAGNOSIS — Y92410 Unspecified street and highway as the place of occurrence of the external cause: Secondary | ICD-10-CM | POA: Diagnosis not present

## 2014-11-15 DIAGNOSIS — Z87891 Personal history of nicotine dependence: Secondary | ICD-10-CM | POA: Diagnosis not present

## 2014-11-15 DIAGNOSIS — J45909 Unspecified asthma, uncomplicated: Secondary | ICD-10-CM | POA: Diagnosis not present

## 2014-11-15 DIAGNOSIS — Y9389 Activity, other specified: Secondary | ICD-10-CM | POA: Diagnosis not present

## 2014-11-15 MED ORDER — OXYCODONE-ACETAMINOPHEN 5-325 MG PO TABS
1.0000 | ORAL_TABLET | Freq: Once | ORAL | Status: AC
  Administered 2014-11-15: 1 via ORAL
  Filled 2014-11-15: qty 1

## 2014-11-15 MED ORDER — ORPHENADRINE CITRATE ER 100 MG PO TB12: 100.0000 mg | ORAL_TABLET | Freq: Two times a day (BID) | ORAL | Status: DC

## 2014-11-15 MED ORDER — OXYCODONE-ACETAMINOPHEN 5-325 MG PO TABS: 1.0000 | ORAL_TABLET | ORAL | Status: DC | PRN

## 2014-11-15 MED ORDER — IBUPROFEN 800 MG PO TABS
800.0000 mg | ORAL_TABLET | Freq: Once | ORAL | Status: AC
  Administered 2014-11-15: 800 mg via ORAL
  Filled 2014-11-15: qty 1

## 2014-11-15 MED ORDER — CYCLOBENZAPRINE HCL 10 MG PO TABS
10.0000 mg | ORAL_TABLET | Freq: Once | ORAL | Status: AC
  Administered 2014-11-15: 10 mg via ORAL
  Filled 2014-11-15: qty 1

## 2014-11-15 MED ORDER — NAPROXEN 500 MG PO TABS: 500.0000 mg | ORAL_TABLET | Freq: Two times a day (BID) | ORAL | Status: DC
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
## 2014-11-15 NOTE — Discharge Instructions (Signed)
Back Pain, Adult °Low back pain is very common. About 1 in 5 people have back pain. The cause of low back pain is rarely dangerous. The pain often gets better over time. About half of people with a sudden onset of back pain feel better in just 2 weeks. About 8 in 10 people feel better by 6 weeks.  °CAUSES °Some common causes of back pain include: °· Strain of the muscles or ligaments supporting the spine. °· Wear and tear (degeneration) of the spinal discs. °· Arthritis. °· Direct injury to the back. °DIAGNOSIS °Most of the time, the direct cause of low back pain is not known. However, back pain can be treated effectively even when the exact cause of the pain is unknown. Answering your caregiver's questions about your overall health and symptoms is one of the most accurate ways to make sure the cause of your pain is not dangerous. If your caregiver needs more information, he or she may order lab work or imaging tests (X-rays or MRIs). However, even if imaging tests show changes in your back, this usually does not require surgery. °HOME CARE INSTRUCTIONS °For many people, back pain returns. Since low back pain is rarely dangerous, it is often a condition that people can learn to manage on their own.  °· Remain active. It is stressful on the back to sit or stand in one place. Do not sit, drive, or stand in one place for more than 30 minutes at a time. Take short walks on level surfaces as soon as pain allows. Try to increase the length of time you walk each day. °· Do not stay in bed. Resting more than 1 or 2 days can delay your recovery. °· Do not avoid exercise or work. Your body is made to move. It is not dangerous to be active, even though your back may hurt. Your back will likely heal faster if you return to being active before your pain is gone. °· Pay attention to your body when you  bend and lift. Many people have less discomfort when lifting if they bend their knees, keep the load close to their bodies, and  avoid twisting. Often, the most comfortable positions are those that put less stress on your recovering back. °· Find a comfortable position to sleep. Use a firm mattress and lie on your side with your knees slightly bent. If you lie on your back, put a pillow under your knees. °· Only take over-the-counter or prescription medicines as directed by your caregiver. Over-the-counter medicines to reduce pain and inflammation are often the most helpful. Your caregiver may prescribe muscle relaxant drugs. These medicines help dull your pain so you can more quickly return to your normal activities and healthy exercise. °· Put ice on the injured area. °¨ Put ice in a plastic bag. °¨ Place a towel between your skin and the bag. °¨ Leave the ice on for 15-20 minutes, 03-04 times a day for the first 2 to 3 days. After that, ice and heat may be alternated to reduce pain and spasms. °· Ask your caregiver about trying back exercises and gentle massage. This may be of some benefit. °· Avoid feeling anxious or stressed. Stress increases muscle tension and can worsen back pain. It is important to recognize when you are anxious or stressed and learn ways to manage it. Exercise is a great option. °SEEK MEDICAL CARE IF: °· You have pain that is not relieved with rest or medicine. °· You have pain that does not improve in 1 week. °· You have new symptoms. °· You are generally not feeling well. °SEEK   IMMEDIATE MEDICAL CARE IF:  °· You have pain that radiates from your back into your legs. °· You develop new bowel or bladder control problems. °· You have unusual weakness or numbness in your arms or legs. °· You develop nausea or vomiting. °· You develop abdominal pain. °· You feel faint. °Document Released: 11/21/2005 Document Revised: 05/22/2012 Document Reviewed: 03/25/2014 °[employer]® Patient Information ©2015 [employer]. This information is not intended to replace advice given to you by your health care provider. Make sure you  discuss any questions you have with your health care provider. ° °Naproxen and naproxen sodium oral immediate-release tablets °What is this medicine? °NAPROXEN (na PROX en) is a non-steroidal anti-inflammatory drug (NSAID). It is used to reduce swelling and to treat pain. This medicine may be used for dental pain, headache, or painful monthly periods. It is also used for painful joint and muscular problems such as arthritis, tendinitis, bursitis, and gout. °This medicine may be used for other purposes; ask your health care provider or pharmacist if you have questions. °COMMON BRAND NAME(S): Aflaxen, Aleve, Aleve Arthritis, All Day Relief, Anaprox, Anaprox DS, Naprosyn °What should I tell my health care provider before I take this medicine? °They need to know if you have any of these conditions: °-asthma °-cigarette smoker °-drink more than 3 alcohol containing drinks a day °-heart disease or circulation problems such as heart failure or leg edema (fluid retention) °-high blood pressure °-kidney disease °-liver disease °-stomach bleeding or ulcers °-an unusual or allergic reaction to naproxen, aspirin, other NSAIDs, other medicines, foods, dyes, or preservatives °-pregnant or trying to get pregnant °-breast-feeding °How should I use this medicine? °Take this medicine by mouth with a glass of water. Follow the directions on the prescription label. Take it with food if your stomach gets upset. Try to not lie down for at least 10 minutes after you take it. Take your medicine at regular intervals. Do not take your medicine more often than directed. Long-term, continuous use may increase the risk of heart attack or stroke. °A special MedGuide will be given to you by the pharmacist with each prescription and refill. Be sure to read this information carefully each time. °Talk to your pediatrician regarding the use of this medicine in children. Special care may be needed. °Overdosage: If you think you have taken too much of  this medicine contact a poison control center or emergency room at once. °NOTE: This medicine is only for you. Do not share this medicine with others. °What if I miss a dose? °If you miss a dose, take it as soon as you can. If it is almost time for your next dose, take only that dose. Do not take double or extra doses. °What may interact with this medicine? °-alcohol °-aspirin °-cidofovir °-diuretics °-lithium °-methotrexate °-other drugs for inflammation like ketorolac or prednisone °-pemetrexed °-probenecid °-warfarin °This list may not describe all possible interactions. Give your health care provider a list of all the medicines, herbs, non-prescription drugs, or dietary supplements you use. Also tell them if you smoke, drink alcohol, or use illegal drugs. Some items may interact with your medicine. °What should I watch for while using this medicine? °Tell your doctor or health care professional if your pain does not get better. Talk to your doctor before taking another medicine for pain. Do not treat yourself. °This medicine does not prevent heart attack or stroke. In fact, this medicine may increase the chance of a heart attack or stroke. The chance may increase   with longer use of this medicine and in people who have heart disease. If you take aspirin to prevent heart attack or stroke, talk with your doctor or health care professional. °Do not take other medicines that contain aspirin, ibuprofen, or naproxen with this medicine. Side effects such as stomach upset, nausea, or ulcers may be more likely to occur. Many medicines available without a prescription should not be taken with this medicine. °This medicine can cause ulcers and bleeding in the stomach and intestines at any time during treatment. Do not smoke cigarettes or drink alcohol. These increase irritation to your stomach and can make it more susceptible to damage from this medicine. Ulcers and bleeding can happen without warning symptoms and can cause  death. °You may get drowsy or dizzy. Do not drive, use machinery, or do anything that needs mental alertness until you know how this medicine affects you. Do not stand or sit up quickly, especially if you are an older patient. This reduces the risk of dizzy or fainting spells. °This medicine can cause you to bleed more easily. Try to avoid damage to your teeth and gums when you brush or floss your teeth. °What side effects may I notice from receiving this medicine? °Side effects that you should report to your doctor or health care professional as soon as possible: °-black or bloody stools, blood in the urine or vomit °-blurred vision °-chest pain °-difficulty breathing or wheezing °-nausea or vomiting °-severe stomach pain °-skin rash, skin redness, blistering or peeling skin, hives, or itching °-slurred speech or weakness on one side of the body °-swelling of eyelids, throat, lips °-unexplained weight gain or swelling °-unusually weak or tired °-yellowing of eyes or skin °Side effects that usually do not require medical attention (report to your doctor or health care professional if they continue or are bothersome): °-constipation °-headache °-heartburn °This list may not describe all possible side effects. Call your doctor for medical advice about side effects. You may report side effects to FDA at 1-800-FDA-1088. °Where should I keep my medicine? °Keep out of the reach of children. °Store at room temperature between 15 and 30 degrees C (59 and 86 degrees F). Keep container tightly closed. Throw away any unused medicine after the expiration date. °NOTE: This sheet is a summary. It may not cover all possible information. If you have questions about this medicine, talk to your doctor, pharmacist, or health care provider. °© 2015, [employer]. (2009-11-23 20:10:16) ° °Orphenadrine tablets °What is this medicine? °ORPHENADRINE (or FEN a dreen) helps to relieve pain and stiffness in muscles and can treat  muscle spasms. °This medicine may be used for other purposes; ask your health care provider or pharmacist if you have questions. °COMMON BRAND NAME(S): Norflex °What should I tell my health care provider before I take this medicine? °They need to know if you have any of these conditions: °-glaucoma °-heart disease °-kidney disease °-myasthenia gravis °-peptic ulcer disease °-prostate disease °-stomach problems °-an unusual or allergic reaction to orphenadrine, other medicines, foods, lactose, dyes, or preservatives °-pregnant or trying to get pregnant °-breast-feeding °How should I use this medicine? °Take this medicine by mouth with a full glass of water. Follow the directions on the prescription label. Take your medicine at regular intervals. Do not take your medicine more often than directed. Do not take more than you are told to take. °Talk to your pediatrician regarding the use of this medicine in children. Special care may be needed. °Patients over 65 years old   may have a stronger reaction and need a smaller dose. °Overdosage: If you think you have taken too much of this medicine contact a poison control center or emergency room at once. °NOTE: This medicine is only for you. Do not share this medicine with others. °What if I miss a dose? °If you miss a dose, take it as soon as you can. If it is almost time for your next dose, take only that dose. Do not take double or extra doses. °What may interact with this medicine? °-alcohol °-antihistamines °-barbiturates, like phenobarbital °-benzodiazepines °-cyclobenzaprine °-medicines for pain °-phenothiazines like chlorpromazine, mesoridazine, prochlorperazine, thioridazine °This list may not describe all possible interactions. Give your health care provider a list of all the medicines, herbs, non-prescription drugs, or dietary supplements you use. Also tell them if you smoke, drink alcohol, or use illegal drugs. Some items may interact with your medicine. °What  should I watch for while using this medicine? °Your mouth may get dry. Chewing sugarless gum or sucking hard candy, and drinking plenty of water may help. Contact your doctor if the problem does not go away or is severe. °This medicine may cause dry eyes and blurred vision. If you wear contact lenses you may feel some discomfort. Lubricating drops may help. See your eye doctor if the problem does not go away or is severe. °You may get drowsy or dizzy. Do not drive, use machinery, or do anything that needs mental alertness until you know how this medicine affects you. Do not stand or sit up quickly, especially if you are an older patient. This reduces the risk of dizzy or fainting spells. Alcohol may interfere with the effect of this medicine. Avoid alcoholic drinks. °What side effects may I notice from receiving this medicine? °Side effects that you should report to your doctor or health care professional as soon as possible: °-allergic reactions like skin rash, itching or hives, swelling of the face, lips, or tongue °-changes in vision °-difficulty breathing °-fast heartbeat or palpitations °-hallucinations °-light headedness, fainting spells °-vomiting °Side effects that usually do not require medical attention (report to your doctor or health care professional if they continue or are bothersome): °-dizziness °-drowsiness °-headache °-nausea °This list may not describe all possible side effects. Call your doctor for medical advice about side effects. You may report side effects to FDA at 1-800-FDA-1088. °Where should I keep my medicine? °Keep out of the reach of children. °Store at room temperature between 15 and 30 degrees C (59 and 86 degrees F). Protect from light. Keep container tightly closed. Throw away any unused medicine after the expiration date. °NOTE: This sheet is a summary. It may not cover all possible information. If you have questions about this medicine, talk to your doctor, pharmacist, or health  care provider. °© 2015, [employer]. (2008-06-17 17:19:12) ° °Acetaminophen; Oxycodone tablets °What is this medicine? °ACETAMINOPHEN; OXYCODONE (a set a MEE noe fen; ox i KOE done) is a pain reliever. It is used to treat mild to moderate pain. °This medicine may be used for other purposes; ask your health care provider or pharmacist if you have questions. °COMMON BRAND NAME(S): Endocet, Magnacet, Narvox, Percocet, Perloxx, Primalev, Primlev, Roxicet, Xolox °What should I tell my health care provider before I take this medicine? °They need to know if you have any of these conditions: °-brain tumor °-Crohn's disease, inflammatory bowel disease, or ulcerative colitis °-drug abuse or addiction °-head injury °-heart or circulation problems °-if you often drink alcohol °-kidney disease or problems   going to the bathroom °-liver disease °-lung disease, asthma, or breathing problems °-an unusual or allergic reaction to acetaminophen, oxycodone, other opioid analgesics, other medicines, foods, dyes, or preservatives °-pregnant or trying to get pregnant °-breast-feeding °How should I use this medicine? °Take this medicine by mouth with a full glass of water. Follow the directions on the prescription label. Take your medicine at regular intervals. Do not take your medicine more often than directed. °Talk to your pediatrician regarding the use of this medicine in children. Special care may be needed. °Patients over 65 years old may have a stronger reaction and need a smaller dose. °Overdosage: If you think you have taken too much of this medicine contact a poison control center or emergency room at once. °NOTE: This medicine is only for you. Do not share this medicine with others. °What if I miss a dose? °If you miss a dose, take it as soon as you can. If it is almost time for your next dose, take only that dose. Do not take double or extra doses. °What may interact with this  medicine? °-alcohol °-antihistamines °-barbiturates like amobarbital, butalbital, butabarbital, methohexital, pentobarbital, phenobarbital, thiopental, and secobarbital °-benztropine °-drugs for bladder problems like solifenacin, trospium, oxybutynin, tolterodine, hyoscyamine, and methscopolamine °-drugs for breathing problems like ipratropium and tiotropium °-drugs for certain stomach or intestine problems like propantheline, homatropine methylbromide, glycopyrrolate, atropine, belladonna, and dicyclomine °-general anesthetics like etomidate, ketamine, nitrous oxide, propofol, desflurane, enflurane, halothane, isoflurane, and sevoflurane °-medicines for depression, anxiety, or psychotic disturbances °-medicines for sleep °-muscle relaxants °-naltrexone °-narcotic medicines (opiates) for pain °-phenothiazines like perphenazine, thioridazine, chlorpromazine, mesoridazine, fluphenazine, prochlorperazine, promazine, and trifluoperazine °-scopolamine °-tramadol °-trihexyphenidyl °This list may not describe all possible interactions. Give your health care provider a list of all the medicines, herbs, non-prescription drugs, or dietary supplements you use. Also tell them if you smoke, drink alcohol, or use illegal drugs. Some items may interact with your medicine. °What should I watch for while using this medicine? °Tell your doctor or health care professional if your pain does not go away, if it gets worse, or if you have new or a different type of pain. You may develop tolerance to the medicine. Tolerance means that you will need a higher dose of the medication for pain relief. Tolerance is normal and is expected if you take this medicine for a long time. °Do not suddenly stop taking your medicine because you may develop a severe reaction. Your body becomes used to the medicine. This does NOT mean you are addicted. Addiction is a behavior related to getting and using a drug for a non-medical reason. If you have pain, you  have a medical reason to take pain medicine. Your doctor will tell you how much medicine to take. If your doctor wants you to stop the medicine, the dose will be slowly lowered over time to avoid any side effects. °You may get drowsy or dizzy. Do not drive, use machinery, or do anything that needs mental alertness until you know how this medicine affects you. Do not stand or sit up quickly, especially if you are an older patient. This reduces the risk of dizzy or fainting spells. Alcohol may interfere with the effect of this medicine. Avoid alcoholic drinks. °There are different types of narcotic medicines (opiates) for pain. If you take more than one type at the same time, you may have more side effects. Give your health care provider a list of all medicines you use. Your doctor will tell you how   much medicine to take. Do not take more medicine than directed. Call emergency for help if you have problems breathing. °The medicine will cause constipation. Try to have a bowel movement at least every 2 to 3 days. If you do not have a bowel movement for 3 days, call your doctor or health care professional. °Do not take Tylenol (acetaminophen) or medicines that have acetaminophen with this medicine. Too much acetaminophen can be very dangerous. Many nonprescription medicines contain acetaminophen. Always read the labels carefully to avoid taking more acetaminophen. °What side effects may I notice from receiving this medicine? °Side effects that you should report to your doctor or health care professional as soon as possible: °-allergic reactions like skin rash, itching or hives, swelling of the face, lips, or tongue °-breathing difficulties, wheezing °-confusion °-light headedness or fainting spells °-severe stomach pain °-unusually weak or tired °-yellowing of the skin or the whites of the eyes °Side effects that usually do not require medical attention (report to your doctor or health care professional if they continue  or are bothersome): °-dizziness °-drowsiness °-nausea °-vomiting °This list may not describe all possible side effects. Call your doctor for medical advice about side effects. You may report side effects to FDA at 1-800-FDA-1088. °Where should I keep my medicine? °Keep out of the reach of children. This medicine can be abused. Keep your medicine in a safe place to protect it from theft. Do not share this medicine with anyone. Selling or giving away this medicine is dangerous and against the law. °Store at room temperature between 20 and 25 degrees C (68 and 77 degrees F). Keep container tightly closed. Protect from light. °This medicine may cause accidental overdose and death if it is taken by other adults, children, or pets. Flush any unused medicine down the toilet to reduce the chance of harm. Do not use the medicine after the expiration date. °NOTE: This sheet is a summary. It may not cover all possible information. If you have questions about this medicine, talk to your doctor, pharmacist, or health care provider. °© 2015, [employer]. (2013-07-15 13:17:35) ° °

## 2014-11-15 NOTE — ED Notes (Signed)
Pt c/o back pain for a while but has gotten worse over last week.
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
## 2014-12-01 ENCOUNTER — Encounter: Payer: Self-pay | Admitting: Orthopedic Surgery

## 2014-12-01 ENCOUNTER — Ambulatory Visit (INDEPENDENT_AMBULATORY_CARE_PROVIDER_SITE_OTHER): Payer: Medicaid Other | Admitting: Orthopedic Surgery

## 2014-12-01 VITALS — BP 110/71 | Ht 70.0 in | Wt 215.0 lb

## 2014-12-01 DIAGNOSIS — M5126 Other intervertebral disc displacement, lumbar region: Secondary | ICD-10-CM

## 2014-12-01 MED ORDER — GABAPENTIN 100 MG PO CAPS: 100.0000 mg | ORAL_CAPSULE | Freq: Three times a day (TID) | ORAL | Status: DC

## 2014-12-01 MED ORDER — HYDROCODONE-ACETAMINOPHEN 5-325 MG PO TABS: 1.0000 | ORAL_TABLET | ORAL | Status: DC | PRN

## 2014-12-01 MED ORDER — NAPROXEN 500 MG PO TABS: 500.0000 mg | ORAL_TABLET | Freq: Two times a day (BID) | ORAL | Status: DC

## 2014-12-01 NOTE — Progress Notes (Signed)
   Subjective:    Patient ID: Ernest Cochran, male    DOB: 12/05/1990, 23 y.o.   MRN: [id number]  HPI Comments: Lifted something about 6 months ago felt pain, ignored for a few days but then back pain worsened and progressed to leg pain and weakness;  Treatment: steroids, naproxen, flexeril, bayer back and body, oxycodone went to ER as well   Pain, locking, stiffness, numbness, with sharp throbbing, burning pain back and radiating to both legs with weakness left leg.  fam hx Diabetes, asthma, GERD, heart, HTN, heart attack, stroke, depression, thyroid, cancer   Back Pain Associated symptoms include numbness and weakness.      Review of Systems  Unable to perform ROS Musculoskeletal: Positive for back pain.  Neurological: Positive for weakness and numbness.       Objective:   Physical Exam  Constitutional: He is oriented to person, place, and time. He appears well-developed and well-nourished. No distress.  HENT:  Head: Normocephalic.  Eyes: Pupils are equal, round, and reactive to light.  Neck: Neck supple.  Cardiovascular: Normal rate and intact distal pulses.   Musculoskeletal:  Upper extremities normal  Lymphadenopathy:       Right: No inguinal adenopathy present.       Left: No inguinal adenopathy present.  Neurological: He is alert and oriented to person, place, and time. He has normal reflexes. He displays normal reflexes. He exhibits normal muscle tone. Coordination normal.  Skin: Skin is warm. No rash noted. No erythema. No pallor.  X 4 extremities   Psychiatric: He has a normal mood and affect. His behavior is normal. Judgment and thought content normal.  lower extremities, weak left quad otherwise normal  ROM stabilty tests normal  slr left positive at 45 degrees Back lower back tenderness abnormal muscle tension           Assessment & Plan:  xrays I read as no fracture or DDD  Encounter Diagnosis  Name Primary?  . Lumbar herniated disc Yes   rec  MRI   Call px after mri   Start neurontin and norco   Continue naproxen and flexeril

## 2014-12-01 NOTE — Patient Instructions (Signed)
We will schedule MRI for you and call you with results 

## 2014-12-23 ENCOUNTER — Other Ambulatory Visit: Payer: Self-pay | Admitting: Orthopedic Surgery

## 2014-12-23 ENCOUNTER — Telehealth: Payer: Self-pay | Admitting: Radiology

## 2014-12-23 DIAGNOSIS — M5126 Other intervertebral disc displacement, lumbar region: Secondary | ICD-10-CM

## 2014-12-23 MED ORDER — HYDROCODONE-ACETAMINOPHEN 5-325 MG PO TABS: 1.0000 | ORAL_TABLET | ORAL | Status: DC | PRN

## 2014-12-23 NOTE — Telephone Encounter (Signed)
Patient was calling about his MRI of his L-spine and it was denied. I told him he could appeal it through his insurance company. He was also asking for a refill on his pain medication.

## 2014-12-23 NOTE — Telephone Encounter (Signed)
NAPROXEN  FLEXERIL NEURONTIN   MAKE SURE HE IS TAKING ALL OF THESE   I WROTE A rX

## 2016-03-27 ENCOUNTER — Encounter (HOSPITAL_COMMUNITY): Payer: Self-pay | Admitting: *Deleted

## 2016-03-27 ENCOUNTER — Emergency Department (HOSPITAL_COMMUNITY)
Admission: EM | Admit: 2016-03-27 | Discharge: 2016-03-27 | Disposition: A | Discharge status: Home / Self Care | Payer: Self-pay | Attending: Emergency Medicine | Admitting: Emergency Medicine

## 2016-03-27 ENCOUNTER — Emergency Department (HOSPITAL_COMMUNITY): Payer: Self-pay

## 2016-03-27 DIAGNOSIS — Z7952 Long term (current) use of systemic steroids: Secondary | ICD-10-CM | POA: Insufficient documentation

## 2016-03-27 DIAGNOSIS — J45909 Unspecified asthma, uncomplicated: Secondary | ICD-10-CM | POA: Insufficient documentation

## 2016-03-27 DIAGNOSIS — F329 Major depressive disorder, single episode, unspecified: Secondary | ICD-10-CM | POA: Insufficient documentation

## 2016-03-27 DIAGNOSIS — Z791 Long term (current) use of non-steroidal anti-inflammatories (NSAID): Secondary | ICD-10-CM | POA: Insufficient documentation

## 2016-03-27 DIAGNOSIS — R519 Headache, unspecified: Secondary | ICD-10-CM

## 2016-03-27 DIAGNOSIS — R51 Headache: Secondary | ICD-10-CM | POA: Insufficient documentation

## 2016-03-27 DIAGNOSIS — Z87891 Personal history of nicotine dependence: Secondary | ICD-10-CM | POA: Insufficient documentation

## 2016-03-27 DIAGNOSIS — Z79891 Long term (current) use of opiate analgesic: Secondary | ICD-10-CM | POA: Insufficient documentation

## 2016-03-27 DIAGNOSIS — Z79899 Other long term (current) drug therapy: Secondary | ICD-10-CM | POA: Insufficient documentation

## 2016-03-27 DIAGNOSIS — R200 Anesthesia of skin: Secondary | ICD-10-CM | POA: Insufficient documentation

## 2016-03-27 LAB — CBC WITH DIFFERENTIAL/PLATELET
BASOS PCT: 1 %
Basophils Absolute: 0.1 10*3/uL (ref 0.0–0.1)
Eosinophils Absolute: 0.3 10*3/uL (ref 0.0–0.7)
Eosinophils Relative: 3 %
HEMATOCRIT: 45.3 % (ref 39.0–52.0)
HEMOGLOBIN: 16.1 g/dL (ref 13.0–17.0)
LYMPHS ABS: 2.1 10*3/uL (ref 0.7–4.0)
LYMPHS PCT: 22 %
MCH: 29.5 pg (ref 26.0–34.0)
MCHC: 35.5 g/dL (ref 30.0–36.0)
MCV: 83.1 fL (ref 78.0–100.0)
MONO ABS: 0.4 10*3/uL (ref 0.1–1.0)
Monocytes Relative: 5 %
NEUTROS ABS: 6.8 10*3/uL (ref 1.7–7.7)
NEUTROS PCT: 69 %
Platelets: 247 10*3/uL (ref 150–400)
RBC: 5.45 MIL/uL (ref 4.22–5.81)
RDW: 12 % (ref 11.5–15.5)
WBC: 9.7 10*3/uL (ref 4.0–10.5)

## 2016-03-27 LAB — COMPREHENSIVE METABOLIC PANEL
ALBUMIN: 4.1 g/dL (ref 3.5–5.0)
ALT: 27 U/L (ref 17–63)
ANION GAP: 9 (ref 5–15)
AST: 23 U/L (ref 15–41)
Alkaline Phosphatase: 112 U/L (ref 38–126)
BUN: 11 mg/dL (ref 6–20)
CHLORIDE: 103 mmol/L (ref 101–111)
CO2: 25 mmol/L (ref 22–32)
Calcium: 9.4 mg/dL (ref 8.9–10.3)
Creatinine, Ser: 0.92 mg/dL (ref 0.61–1.24)
GFR calc non Af Amer: >60 mL/min (ref >60)
Glucose, Bld: 140 mg/dL — ABNORMAL HIGH (ref 65–99)
POTASSIUM: 3.9 mmol/L (ref 3.5–5.1)
Sodium: 137 mmol/L (ref 135–145)
Total Bilirubin: 0.6 mg/dL (ref 0.3–1.2)
Total Protein: 7.4 g/dL (ref 6.5–8.1)

## 2016-03-27 LAB — URINALYSIS, ROUTINE W REFLEX MICROSCOPIC
Bilirubin Urine: NEGATIVE
Glucose, UA: 250 mg/dL — AB
KETONES UR: NEGATIVE mg/dL
Leukocytes, UA: NEGATIVE
NITRITE: NEGATIVE
PH: 6 (ref 5.0–8.0)
Protein, ur: NEGATIVE mg/dL
SPECIFIC GRAVITY, URINE: 1.009 (ref 1.005–1.030)

## 2016-03-27 LAB — URINE MICROSCOPIC-ADD ON
SQUAMOUS EPITHELIAL / LPF: NONE SEEN
WBC UA: NONE SEEN WBC/hpf (ref 0–5)

## 2016-03-27 NOTE — ED Provider Notes (Signed)
CSN: [id number]     Arrival date & time 03/27/16  1252 History   First MD Initiated Contact with Patient 03/27/16 1316     Chief Complaint  Patient presents with  . Numbness  . Headache   PT HAD A SUDDEN ONSET OF BLURRED VISION AROUND 1100.  HE THEN DEVELOPED A H/A WITH NUMBNESS TO HIS LEFT HAND.  PT ATE AND TOOK IBUPROFEN 800 MG AND NOW FEELS BETTER.  HE CAME IN B/C HE WAS WORRIED THAT HE HAD A STROKE.  (Consider location/radiation/quality/duration/timing/severity/associated sxs/prior Treatment) Patient is a 25 y.o. male presenting with headaches. The history is provided by the patient.  Headache Pain location:  Generalized Quality:  Sharp Radiates to:  Does not radiate Severity currently:  0/10 Severity at highest:  5/10 Onset quality:  Sudden Timing:  Rare Progression:  Resolved Chronicity:  New Relieved by:  NSAIDs Worsened by:  Nothing Associated symptoms: numbness     Past Medical History  Diagnosis Date  . Asthma   . Depression    History reviewed. No pertinent past surgical history. No family history on file. Social History  Substance Use Topics  . Smoking status: Former Games developermoker  . Smokeless tobacco: None  . Alcohol Use: No    Review of Systems  Neurological: Positive for numbness and headaches.  All other systems reviewed and are negative.     Allergies  Review of patient's allergies indicates no known allergies.  Home Medications   Prior to Admission medications   Medication Sig Start Date End Date Taking? Authorizing Provider  ibuprofen (ADVIL,MOTRIN) 200 MG tablet Take 800 mg by mouth every 6 (six) hours as needed for moderate pain.   Yes Historical Provider, MD  gabapentin (NEURONTIN) 100 MG capsule Take 1 capsule (100 mg total) by mouth 3 (three) times daily. Patient not taking: Reported on 03/27/2016 12/01/14   Vickki HearingStanley E Harrison, MD  HYDROcodone-acetaminophen [facility]) 5-325 MG per tablet Take 1 tablet by mouth every 4 (four) hours as needed for  moderate pain. Patient not taking: Reported on 03/27/2016 12/23/14   Vickki HearingStanley E Harrison, MD  ibuprofen (ADVIL,MOTRIN) 600 MG tablet Take 1 tablet (600 mg total) by mouth every 6 (six) hours as needed. Patient not taking: Reported on 12/01/2014 05/15/14   Loren Raceravid Yelverton, MD  methocarbamol (ROBAXIN) 500 MG tablet Take 1 tablet (500 mg total) by mouth every 8 (eight) hours as needed for muscle spasms. Patient not taking: Reported on 12/01/2014 05/15/14   Loren Raceravid Yelverton, MD  naproxen (NAPROSYN) 500 MG tablet Take 1 tablet (500 mg total) by mouth 2 (two) times daily. Patient not taking: Reported on 03/27/2016 12/01/14   Vickki HearingStanley E Harrison, MD  orphenadrine (NORFLEX) 100 MG tablet Take 1 tablet (100 mg total) by mouth 2 (two) times daily. Patient not taking: Reported on 12/01/2014 11/15/14   Dione Boozeavid Glick, MD  oxyCODONE-acetaminophen (PERCOCET) 5-325 MG per tablet Take 1 tablet by mouth every 4 (four) hours as needed for moderate pain. Patient not taking: Reported on 12/01/2014 11/15/14   Dione Boozeavid Glick, MD  predniSONE (DELTASONE) 20 MG tablet 3 tabs po day one, then 2 tabs daily x 4 days Patient not taking: Reported on 12/01/2014 07/10/14   Wayland SalinasJohn Bednar, MD   BP 125/62 mmHg  Pulse 67  Temp(Src) 98 F (36.7 C) (Oral)  Resp 20  SpO2 99% Physical Exam  Constitutional: He is oriented to person, place, and time. He appears well-developed and well-nourished.  HENT:  Head: Normocephalic and atraumatic.  Mouth/Throat: Oropharynx is clear  and moist.  Eyes: Conjunctivae and EOM are normal. Pupils are equal, round, and reactive to light.  Neck: Normal range of motion. Neck supple.  Cardiovascular: Normal rate, regular rhythm, normal heart sounds and intact distal pulses.   Pulmonary/Chest: Effort normal and breath sounds normal.  Abdominal: Soft. Bowel sounds are normal.  Musculoskeletal: Normal range of motion.  Neurological: He is alert and oriented to person, place, and time. He has normal strength. No  cranial nerve deficit or sensory deficit. He displays a negative Romberg sign. GCS eye subscore is 4. GCS verbal subscore is 5. GCS motor subscore is 6.  Skin: Skin is warm and dry.  Psychiatric: He has a normal mood and affect. His behavior is normal. Judgment and thought content normal.  Nursing note and vitals reviewed.   ED Course  Procedures (including critical care time) Labs Review Labs Reviewed  COMPREHENSIVE METABOLIC PANEL - Abnormal; Notable for the following:    Glucose, Bld 140 (*)    All other components within normal limits  URINALYSIS, ROUTINE W REFLEX MICROSCOPIC (NOT AT [facility]) - Abnormal; Notable for the following:    APPearance CLOUDY (*)    Glucose, UA 250 (*)    Hgb urine dipstick TRACE (*)    All other components within normal limits  URINE MICROSCOPIC-ADD ON - Abnormal; Notable for the following:    Bacteria, UA RARE (*)    All other components within normal limits  CBC WITH DIFFERENTIAL/PLATELET    Imaging Review Ct Head Wo Contrast  03/27/2016  CLINICAL DATA:  25 year old male with acute onset blurred vision at 1100 hours, subsequent severe headache. Left hand and face numbness developed. Symptoms since regressed. Initial encounter. EXAM: CT HEAD WITHOUT CONTRAST TECHNIQUE: Contiguous axial images were obtained from the base of the skull through the vertex without intravenous contrast. COMPARISON:  None. FINDINGS: Visualized paranasal sinuses and mastoids are clear. No osseous abnormality identified. Visualized orbit soft tissues are within normal limits. Visualized scalp soft tissues are within normal limits. Cerebral volume is normal. No midline shift, ventriculomegaly, mass effect, evidence of mass lesion, intracranial hemorrhage or evidence of cortically based acute infarction. Gray-white matter differentiation is within normal limits throughout the brain. No suspicious intracranial vascular hyperdensity. IMPRESSION: Normal noncontrast CT appearance of the brain.  Electronically Signed   By: Odessa Fleming M.D.   On: 03/27/2016 14:02   I have personally reviewed and evaluated these images and lab results as part of my medical decision-making.   EKG Interpretation None      MDM  PT CONTINUES TO HAVE NO H/A.  NO NEURO SX. Final diagnoses:  None  HEADACHE    Jacalyn Lefevre, MD 03/27/16 [phone]

## 2016-03-27 NOTE — ED Notes (Signed)
His only c/o at this time is of a mild (2/10) h/a at upper forehead area R=L.

## 2016-03-27 NOTE — ED Notes (Addendum)
Pt states he had sudden blurred vision at 11AM. Pt then went to get something to eat. Pt states he developed a severe headache and his left hand and left face felt numb. Pt states his symptoms have since resolved. Pt took ibuprofen, which he states helped with his headache.

## 2016-03-27 NOTE — Discharge Instructions (Signed)

## 2016-07-16 ENCOUNTER — Emergency Department (HOSPITAL_COMMUNITY): Payer: Worker's Compensation

## 2016-07-16 ENCOUNTER — Encounter (HOSPITAL_COMMUNITY): Payer: Self-pay

## 2016-07-16 ENCOUNTER — Emergency Department (HOSPITAL_COMMUNITY)
Admission: EM | Admit: 2016-07-16 | Discharge: 2016-07-16 | Disposition: A | Discharge status: Home / Self Care | Payer: Worker's Compensation | Attending: Emergency Medicine | Admitting: Emergency Medicine

## 2016-07-16 DIAGNOSIS — S60041A Contusion of right ring finger without damage to nail, initial encounter: Secondary | ICD-10-CM | POA: Insufficient documentation

## 2016-07-16 DIAGNOSIS — Y990 Civilian activity done for income or pay: Secondary | ICD-10-CM | POA: Diagnosis not present

## 2016-07-16 DIAGNOSIS — Y9289 Other specified places as the place of occurrence of the external cause: Secondary | ICD-10-CM | POA: Insufficient documentation

## 2016-07-16 DIAGNOSIS — Y939 Activity, unspecified: Secondary | ICD-10-CM | POA: Diagnosis not present

## 2016-07-16 DIAGNOSIS — S6991XA Unspecified injury of right wrist, hand and finger(s), initial encounter: Secondary | ICD-10-CM | POA: Diagnosis present

## 2016-07-16 DIAGNOSIS — Z79899 Other long term (current) drug therapy: Secondary | ICD-10-CM | POA: Insufficient documentation

## 2016-07-16 DIAGNOSIS — Z87891 Personal history of nicotine dependence: Secondary | ICD-10-CM | POA: Insufficient documentation

## 2016-07-16 DIAGNOSIS — J45909 Unspecified asthma, uncomplicated: Secondary | ICD-10-CM | POA: Insufficient documentation

## 2016-07-16 DIAGNOSIS — S6000XA Contusion of unspecified finger without damage to nail, initial encounter: Secondary | ICD-10-CM

## 2016-07-16 DIAGNOSIS — W231XXA Caught, crushed, jammed, or pinched between stationary objects, initial encounter: Secondary | ICD-10-CM | POA: Insufficient documentation

## 2016-07-16 MED ORDER — IBUPROFEN 600 MG PO TABS: 600.0000 mg | ORAL_TABLET | Freq: Four times a day (QID) | ORAL | 0 refills | Status: DC | PRN

## 2016-07-16 NOTE — ED Triage Notes (Signed)
Patient here with right hand index finger pain after catching end of finger in clamp, no obvious trauma noted, dried blood under nailbed.

## 2016-07-16 NOTE — ED Provider Notes (Signed)
[facility]-EMERGENCY DEPT Provider Note   CSN: [id number] Arrival date & time: 07/16/16  2014  First Provider Contact:   First MD Initiated Contact with Patient 07/16/16 2110      By signing my name below, I, Nelwyn Salisbury, attest that this documentation has been prepared under the direction and in the presence of non-physician practitioner, Fayrene Helper, PA-C  Electronically Signed: Nelwyn Salisbury, Scribe. 07/16/2016. 8:45 PM.  History   Chief Complaint Chief Complaint  Patient presents with  . Finger Injury    The history is provided by the patient. No language interpreter was used.     HPI Comments:  Ernest Cochran is a 25 y.o. male who presents to the Emergency Department complaining of throbbing right ring finger pain, occurring about two hours ago. He reports being at work when he got his right ring finger caught in a machine, where it was jammed.Patient rates the pain at 3/10. Pt is  right handed.  No alleviating factors noted.   Past Medical History:  Diagnosis Date  . Asthma   . Depression     Patient Active Problem List   Diagnosis Date Noted  . Chest pain, unspecified 01/31/2014  . Other and unspecified noninfectious gastroenteritis and colitis(558.9) 01/31/2014    History reviewed. No pertinent surgical history.     Home Medications    Prior to Admission medications   Medication Sig Start Date End Date Taking? Authorizing Provider  gabapentin (NEURONTIN) 100 MG capsule Take 1 capsule (100 mg total) by mouth 3 (three) times daily. Patient not taking: Reported on 03/27/2016 12/01/14   Vickki Hearing, MD  HYDROcodone-acetaminophen [facility]) 5-325 MG per tablet Take 1 tablet by mouth every 4 (four) hours as needed for moderate pain. Patient not taking: Reported on 03/27/2016 12/23/14   Vickki Hearing, MD  ibuprofen (ADVIL,MOTRIN) 200 MG tablet Take 800 mg by mouth every 6 (six) hours as needed for moderate pain.    Historical Provider, MD  ibuprofen  (ADVIL,MOTRIN) 600 MG tablet Take 1 tablet (600 mg total) by mouth every 6 (six) hours as needed. Patient not taking: Reported on 12/01/2014 05/15/14   Loren Racer, MD  methocarbamol (ROBAXIN) 500 MG tablet Take 1 tablet (500 mg total) by mouth every 8 (eight) hours as needed for muscle spasms. Patient not taking: Reported on 12/01/2014 05/15/14   Loren Racer, MD  naproxen (NAPROSYN) 500 MG tablet Take 1 tablet (500 mg total) by mouth 2 (two) times daily. Patient not taking: Reported on 03/27/2016 12/01/14   Vickki Hearing, MD  orphenadrine (NORFLEX) 100 MG tablet Take 1 tablet (100 mg total) by mouth 2 (two) times daily. Patient not taking: Reported on 12/01/2014 11/15/14   Dione Booze, MD  oxyCODONE-acetaminophen (PERCOCET) 5-325 MG per tablet Take 1 tablet by mouth every 4 (four) hours as needed for moderate pain. Patient not taking: Reported on 12/01/2014 11/15/14   Dione Booze, MD  predniSONE (DELTASONE) 20 MG tablet 3 tabs po day one, then 2 tabs daily x 4 days Patient not taking: Reported on 12/01/2014 07/10/14   Wayland Salinas, MD    Family History No family history on file.  Social History Social History  Substance Use Topics  . Smoking status: Former Games developer  . Smokeless tobacco: Never Used  . Alcohol use No     Allergies   Review of patient's allergies indicates no known allergies.   Review of Systems Review of Systems  Constitutional: Negative for fever.  Respiratory: Negative for shortness of  breath.   Musculoskeletal: Positive for arthralgias and myalgias.     Physical Exam Updated Vital Signs BP 133/63 (BP Location: Right Arm)   Pulse 80   Temp 98.7 F (37.1 C) (Oral)   Resp 16   Ht 5\' 10"  (1.778 m)   Wt 231 lb 2 oz (104.8 kg)   SpO2 97%   BMI 33.16 kg/m   Physical Exam  Constitutional: He is oriented to person, place, and time. He appears well-developed and well-nourished. No distress.  HENT:  Head: Normocephalic and atraumatic.  Eyes:  Conjunctivae are normal.  Cardiovascular: Normal rate.   Pulmonary/Chest: Effort normal.  Abdominal: He exhibits no distension.  Musculoskeletal:  Right hand ring finger: Dry blood noted to edge of nail without any obvious subungal hematoma. Fingertip is mildy edematous with normal sensation and no obvious bruising noted. Decreased flexion at the DIP but no gross deformity.   Neurological: He is alert and oriented to person, place, and time.  Skin: Skin is warm and dry.  Psychiatric: He has a normal mood and affect.  Nursing note and vitals reviewed.    ED Treatments / Results  DIAGNOSTIC STUDIES:  Oxygen Saturation is 97% on RA, normal by my interpretation.    COORDINATION OF CARE:  9:12 PM Discussed treatment plan with pt at bedside which included a tetanus shot and splint and pt agreed to plan.  Labs (all labs ordered are listed, but only abnormal results are displayed) Labs Reviewed - No data to display  EKG  EKG Interpretation None       Radiology Dg Hand Complete Right  Result Date: 07/16/2016 CLINICAL DATA:  Dropped heavy object on right hand. Ring finger laceration and pain. Initial encounter. EXAM: RIGHT HAND - COMPLETE 3+ VIEW COMPARISON:  None. FINDINGS: There is no evidence of fracture or dislocation. There is no evidence of arthropathy or other focal bone abnormality. Soft tissues are unremarkable. IMPRESSION: Negative. Electronically Signed   By: Myles RosenthalJohn  Stahl M.D.   On: 07/16/2016 20:51    Procedures Procedures (including critical care time)  Medications Ordered in ED Medications - No data to display   Initial Impression / Assessment and Plan / ED Course  I have reviewed the triage vital signs and the nursing notes.  Pertinent labs & imaging results that were available during my care of the patient were reviewed by me and considered in my medical decision making (see chart for details).  Clinical Course    BP 133/63 (BP Location: Right Arm)   Pulse  80   Temp 98.7 F (37.1 C) (Oral)   Resp 16   Ht 5\' 10"  (1.778 m)   Wt 104.8 kg   SpO2 97%   BMI 33.16 kg/m    Patient X-Ray negative for obvious fracture or dislocation. Diagnosed with finger tip contusion from crush injury. Pt advised to follow up with orthopedics. Patient given finger splint while in ED, conservative therapy recommended and discussed. Patient will be discharged home & is agreeable with above plan. Returns precautions discussed. Pt appears safe for discharge.   Final Clinical Impressions(s) / ED Diagnoses   Final diagnoses:  Finger contusion, initial encounter    New Prescriptions Current Discharge Medication List     I personally performed the services described in this documentation, which was scribed in my presence. The recorded information has been reviewed and is accurate.       Fayrene HelperBowie Maleeka Sabatino, PA-C 07/16/16 2150    Donnetta HutchingBrian Cook, MD 07/17/16 [phone]

## 2016-07-16 NOTE — ED Notes (Signed)
Patient verbalized understanding of discharge instructions and denies any further needs or questions at this time. VS stable. Patient ambulatory with steady gait.  

## 2017-04-25 ENCOUNTER — Ambulatory Visit (INDEPENDENT_AMBULATORY_CARE_PROVIDER_SITE_OTHER): Payer: Managed Care, Other (non HMO) | Admitting: Orthopaedic Surgery

## 2017-04-25 ENCOUNTER — Encounter: Payer: Self-pay | Admitting: Orthopaedic Surgery

## 2017-04-25 VITALS — BP 140/83 | HR 83 | Temp 97.7°F | Ht 70.0 in | Wt 228.0 lb

## 2017-04-25 DIAGNOSIS — M25561 Pain in right knee: Secondary | ICD-10-CM | POA: Diagnosis not present

## 2017-04-25 NOTE — Patient Instructions (Addendum)
Knee Exercises Ask your health care provider which exercises are safe for you. Do exercises exactly as told by your health care provider and adjust them as directed. It is normal to feel mild stretching, pulling, tightness, or discomfort as you do these exercises, but you should stop right away if you feel sudden pain or your pain gets worse.Do not begin these exercises until told by your health care provider. STRETCHING AND RANGE OF MOTION EXERCISES  These exercises warm up your muscles and joints and improve the movement and flexibility of your knee. These exercises also help to relieve pain, numbness, and tingling. Exercise A: Knee Extension, Prone  1. Lie on your abdomen on a bed. 2. Place your left / right knee just beyond the edge of the surface so your knee is not on the bed. You can put a towel under your left / right thigh just above your knee for comfort. 3. Relax your leg muscles and allow gravity to straighten your knee. You should feel a stretch behind your left / right knee. 4. Hold this position for __________ seconds. 5. Scoot up so your knee is supported between repetitions. Repeat __________ times. Complete this stretch __________ times a day. Exercise B: Knee Flexion, Active   1. Lie on your back with both knees straight. If this causes back discomfort, bend your left / right knee so your foot is flat on the floor. 2. Slowly slide your left / right heel back toward your buttocks until you feel a gentle stretch in the front of your knee or thigh. 3. Hold this position for __________ seconds. 4. Slowly slide your left / right heel back to the starting position. Repeat __________ times. Complete this exercise __________ times a day. Exercise C: Quadriceps, Prone   1. Lie on your abdomen on a firm surface, such as a bed or padded floor. 2. Bend your left / right knee and hold your ankle. If you cannot reach your ankle or pant leg, loop a belt around your foot and grab the belt  instead. 3. Gently pull your heel toward your buttocks. Your knee should not slide out to the side. You should feel a stretch in the front of your thigh and knee. 4. Hold this position for __________ seconds. Repeat __________ times. Complete this stretch __________ times a day. Exercise D: Hamstring, Supine  1. Lie on your back. 2. Loop a belt or towel over the ball of your left / right foot. The ball of your foot is on the walking surface, right under your toes. 3. Straighten your left / right knee and slowly pull on the belt to raise your leg until you feel a gentle stretch behind your knee.  Do not let your left / right knee bend while you do this.  Keep your other leg flat on the floor. 4. Hold this position for __________ seconds. Repeat __________ times. Complete this stretch __________ times a day. STRENGTHENING EXERCISES  These exercises build strength and endurance in your knee. Endurance is the ability to use your muscles for a Tasnia Spegal time, even after they get tired. Exercise E: Quadriceps, Isometric   1. Lie on your back with your left / right leg extended and your other knee bent. Put a rolled towel or small pillow under your knee if told by your health care provider. 2. Slowly tense the muscles in the front of your left / right thigh. You should see your kneecap slide up toward your hip or see   increased dimpling just above the knee. This motion will push the back of the knee toward the floor. 3. For __________ seconds, keep the muscle as tight as you can without increasing your pain. 4. Relax the muscles slowly and completely. Repeat __________ times. Complete this exercise __________ times a day. Exercise F: Straight Leg Raises - Quadriceps  1. Lie on your back with your left / right leg extended and your other knee bent. 2. Tense the muscles in the front of your left / right thigh. You should see your kneecap slide up or see increased dimpling just above the knee. Your thigh  may even shake a bit. 3. Keep these muscles tight as you raise your leg 4-6 inches (10-15 cm) off the floor. Do not let your knee bend. 4. Hold this position for __________ seconds. 5. Keep these muscles tense as you lower your leg. 6. Relax your muscles slowly and completely after each repetition. Repeat __________ times. Complete this exercise __________ times a day. Exercise G: Hamstring, Isometric  1. Lie on your back on a firm surface. 2. Bend your left / right knee approximately __________ degrees. 3. Dig your left / right heel into the surface as if you are trying to pull it toward your buttocks. Tighten the muscles in the back of your thighs to dig as hard as you can without increasing any pain. 4. Hold this position for __________ seconds. 5. Release the tension gradually and allow your muscles to relax completely for __________ seconds after each repetition. Repeat __________ times. Complete this exercise __________ times a day. Exercise H: Hamstring Curls   If told by your health care provider, do this exercise while wearing ankle weights. Begin with __________ weights. Then increase the weight by 1 lb (0.5 kg) increments. Do not wear ankle weights that are more than __________. 1. Lie on your abdomen with your legs straight. 2. Bend your left / right knee as far as you can without feeling pain. Keep your hips flat against the floor. 3. Hold this position for __________ seconds. 4. Slowly lower your leg to the starting position. Repeat __________ times. Complete this exercise __________ times a day. Exercise I: Squats (Quadriceps)  1. Stand in front of a table, with your feet and knees pointing straight ahead. You may rest your hands on the table for balance but not for support. 2. Slowly bend your knees and lower your hips like you are going to sit in a chair.  Keep your weight over your heels, not over your toes.  Keep your lower legs upright so they are parallel with the  table legs.  Do not let your hips go lower than your knees.  Do not bend lower than told by your health care provider.  If your knee pain increases, do not bend as low. 3. Hold the squat position for __________ seconds. 4. Slowly push with your legs to return to standing. Do not use your hands to pull yourself to standing. Repeat __________ times. Complete this exercise __________ times a day. Exercise J: Wall Slides (Quadriceps)   1. Lean your back against a smooth wall or door while you walk your feet out 18-24 inches (46-61 cm) from it. 2. Place your feet hip-width apart. 3. Slowly slide down the wall or door until your knees Repeat __________ times. Complete this exercise __________ times a day. 4. Exercise K: Straight Leg Raises - Hip Abductors  1. Lie on your side with your left / right leg   in the top position. Lie so your head, shoulder, knee, and hip line up. You may bend your bottom knee to help you keep your balance. 2. Roll your hips slightly forward so your hips are stacked directly over each other and your left / right knee is facing forward. 3. Leading with your heel, lift your top leg 4-6 inches (10-15 cm). You should feel the muscles in your outer hip lifting.  Do not let your foot drift forward.  Do not let your knee roll toward the ceiling. 4. Hold this position for __________ seconds. 5. Slowly return your leg to the starting position. 6. Let your muscles relax completely after each repetition. Repeat __________ times. Complete this exercise __________ times a day. Exercise L: Straight Leg Raises - Hip Extensors  1. Lie on your abdomen on a firm surface. You can put a pillow under your hips if that is more comfortable. 2. Tense the muscles in your buttocks and lift your left / right leg about 4-6 inches (10-15 cm). Keep your knee straight as you lift your leg. 3. Hold this position for __________ seconds. 4. Slowly lower your leg to the starting position. 5. Let  your leg relax completely after each repetition. Repeat __________ times. Complete this exercise __________ times a day. This information is not intended to replace advice given to you by your health care provider. Make sure you discuss any questions you have with your health care provider. Document Released: 10/05/2005 Document Revised: 08/15/2016 Document Reviewed: 09/27/2015 [employer] Interactive Patient Education  2017 [employer]     Out of work
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
## 2017-04-27 ENCOUNTER — Ambulatory Visit (HOSPITAL_COMMUNITY)
Admission: RE | Admit: 2017-04-27 | Discharge: 2017-04-27 | Disposition: A | Discharge status: Home / Self Care | Payer: Managed Care, Other (non HMO) | Source: Ambulatory Visit | Attending: Orthopaedic Surgery | Admitting: Orthopaedic Surgery

## 2017-04-27 DIAGNOSIS — X58XXXA Exposure to other specified factors, initial encounter: Secondary | ICD-10-CM | POA: Diagnosis not present

## 2017-04-27 DIAGNOSIS — M25561 Pain in right knee: Secondary | ICD-10-CM

## 2017-04-27 DIAGNOSIS — S82144A Nondisplaced bicondylar fracture of right tibia, initial encounter for closed fracture: Secondary | ICD-10-CM | POA: Insufficient documentation

## 2017-05-02 ENCOUNTER — Encounter: Payer: Self-pay | Admitting: Orthopaedic Surgery

## 2017-05-02 ENCOUNTER — Ambulatory Visit (INDEPENDENT_AMBULATORY_CARE_PROVIDER_SITE_OTHER): Payer: Managed Care, Other (non HMO) | Admitting: Orthopaedic Surgery

## 2017-05-02 VITALS — BP 131/66 | HR 76 | Temp 97.7°F | Ht 70.0 in

## 2017-05-02 DIAGNOSIS — S82141A Displaced bicondylar fracture of right tibia, initial encounter for closed fracture: Secondary | ICD-10-CM | POA: Diagnosis not present

## 2017-05-02 NOTE — Progress Notes (Signed)
Patient Ernest Cochran, male DOB:Sep 05, 1991, 26 y.o. JXB:[id number]  Chief Complaint  Patient presents with  . Results    MRI Results Right Knee    HPI  Ernest Cochran is a 26 y.o. male who has right knee pain. He had a MRI of the right knee showing: IMPRESSION: 1. Acute nondisplaced, nondepressed posterior lateral tibial plateau fracture with surrounding marrow edema. 2. No meniscal or ligamentous injury of the right knee.  I have explained the findings to him.  He will not need surgery if he will continue the crutches and knee immobilizer.  He will need to be out of work about six to eight weeks.   HPI  There is no height or weight on file to calculate BMI.  ROS  Review of Systems  HENT: Negative for congestion.   Respiratory: Negative for cough and shortness of breath.   Cardiovascular: Negative for chest pain and leg swelling.  Endocrine: Negative for cold intolerance.  Musculoskeletal: Positive for arthralgias, gait problem and joint swelling.  Allergic/Immunologic: Negative for environmental allergies.    Past Medical History:  Diagnosis Date  . Asthma   . Depression     History reviewed. No pertinent surgical history.  History reviewed. No pertinent family history.  Social History Social History  Substance Use Topics  . Smoking status: Former Games developer  . Smokeless tobacco: Never Used  . Alcohol use No    No Known Allergies  Current Outpatient Prescriptions  Medication Sig Dispense Refill  . gabapentin (NEURONTIN) 100 MG capsule Take 1 capsule (100 mg total) by mouth 3 (three) times daily. (Patient not taking: Reported on 03/27/2016) 90 capsule 0  . HYDROcodone-acetaminophen (NORCO) 5-325 MG per tablet Take 1 tablet by mouth every 4 (four) hours as needed for moderate pain. (Patient not taking: Reported on 03/27/2016) 42 tablet 0  . ibuprofen (ADVIL,MOTRIN) 600 MG tablet Take 1 tablet (600 mg total) by mouth every 6 (six) hours as needed. (Patient not  taking: Reported on 04/25/2017) 30 tablet 0  . methocarbamol (ROBAXIN) 500 MG tablet Take 1 tablet (500 mg total) by mouth every 8 (eight) hours as needed for muscle spasms. (Patient not taking: Reported on 12/01/2014) 20 tablet 0  . naproxen (NAPROSYN) 500 MG tablet Take 1 tablet (500 mg total) by mouth 2 (two) times daily. (Patient not taking: Reported on 03/27/2016) 60 tablet 2  . orphenadrine (NORFLEX) 100 MG tablet Take 1 tablet (100 mg total) by mouth 2 (two) times daily. (Patient not taking: Reported on 12/01/2014) 30 tablet 0  . oxyCODONE-acetaminophen (PERCOCET) 5-325 MG per tablet Take 1 tablet by mouth every 4 (four) hours as needed for moderate pain. (Patient not taking: Reported on 12/01/2014) 20 tablet 0  . predniSONE (DELTASONE) 20 MG tablet 3 tabs po day one, then 2 tabs daily x 4 days (Patient not taking: Reported on 12/01/2014) 11 tablet 0   No current facility-administered medications for this visit.      Physical Exam  Blood pressure 131/66, pulse 76, temperature 97.7 F (36.5 C), height 5\' 10"  (1.778 m).  Constitutional: overall normal hygiene, normal nutrition, well developed, normal grooming, normal body habitus. Assistive device:crutches  Musculoskeletal: gait and station Limp no weight bearing right, muscle tone and strength are normal, no tremors or atrophy is present.  .  Neurological: coordination overall normal.  Deep tendon reflex/nerve stretch intact.  Sensation normal.  Cranial nerves II-XII intact.   Skin:   Normal overall no scars, lesions, ulcers or rashes. No psoriasis.  Psychiatric: Alert and oriented x 3.  Recent memory intact, remote memory unclear.  Normal mood and affect. Well groomed.  Good eye contact.  Cardiovascular: overall no swelling, no varicosities, no edema bilaterally, normal temperatures of the legs and arms, no clubbing, cyanosis and good capillary refill.  Lymphatic: palpation is normal.  Right knee has effusion, painful ROM 0 to 90  with more pain laterally.  No crepitus is felt.  NV intact.  The patient has been educated about the nature of the problem(s) and counseled on treatment options.  The patient appeared to understand what I have discussed and is in agreement with it.  Encounter Diagnosis  Name Primary?  . Closed fracture of right tibial plateau, initial encounter Yes    PLAN Call if any problems.  Precautions discussed.  Continue current medications.   Return to clinic 3 weeks   X-rays on return.  Continue no weight bearing.  Out of work.  Electronically Signed Darreld McleanWayne Lemuel Boodram, MD 5/29/20188:31 AM

## 2017-05-02 NOTE — Patient Instructions (Signed)
Out of work next six to eight weeks.

## 2017-05-23 ENCOUNTER — Encounter: Payer: Self-pay | Admitting: Orthopaedic Surgery

## 2017-05-23 ENCOUNTER — Ambulatory Visit (INDEPENDENT_AMBULATORY_CARE_PROVIDER_SITE_OTHER): Payer: Medicaid Other

## 2017-05-23 ENCOUNTER — Ambulatory Visit (INDEPENDENT_AMBULATORY_CARE_PROVIDER_SITE_OTHER): Payer: Self-pay | Admitting: Orthopaedic Surgery

## 2017-05-23 VITALS — BP 100/69 | HR 68 | Ht 70.0 in | Wt 229.0 lb

## 2017-05-23 DIAGNOSIS — S82122D Displaced fracture of lateral condyle of left tibia, subsequent encounter for closed fracture with routine healing: Secondary | ICD-10-CM

## 2017-05-23 DIAGNOSIS — S82141D Displaced bicondylar fracture of right tibia, subsequent encounter for closed fracture with routine healing: Secondary | ICD-10-CM | POA: Diagnosis not present

## 2017-05-23 NOTE — Progress Notes (Signed)
CC:  I stopped my crutches this weekend  He got frustrated using his crutches and stopped using them this past weekend.  He said he was tired of using them.  I have explained he has a fracture of the right posterior lateral tibial plateau only seen on MRI and that the weight bearing could cause it to collapse and need surgery.  I have gone over possible complications.    He agrees to resume his crutches.  He lost his job because of this.  X-rays were done, reported separately.  Encounter Diagnoses  Name Primary?  . Closed fracture of lateral portion of left tibial plateau with routine healing, subsequent encounter   . Closed fracture of right tibial plateau with routine healing, subsequent encounter Yes   Return in one month.  Use crutches, nonweight bearing on the right.  Call if any problem.  Precautions discussed.   Electronically Signed Darreld McleanWayne Kadyn Guild, MD 6/19/20188:47 AM

## 2017-06-20 ENCOUNTER — Ambulatory Visit (INDEPENDENT_AMBULATORY_CARE_PROVIDER_SITE_OTHER): Payer: Self-pay | Admitting: Orthopaedic Surgery

## 2017-06-20 ENCOUNTER — Ambulatory Visit (INDEPENDENT_AMBULATORY_CARE_PROVIDER_SITE_OTHER): Payer: Medicaid Other

## 2017-06-20 VITALS — BP 115/53 | HR 71 | Temp 97.5°F | Ht 70.0 in | Wt 232.0 lb

## 2017-06-20 DIAGNOSIS — S82122D Displaced fracture of lateral condyle of left tibia, subsequent encounter for closed fracture with routine healing: Secondary | ICD-10-CM | POA: Diagnosis not present

## 2017-06-20 DIAGNOSIS — S82141D Displaced bicondylar fracture of right tibia, subsequent encounter for closed fracture with routine healing: Secondary | ICD-10-CM

## 2017-06-20 NOTE — Progress Notes (Signed)
CC:  My knee does not hurt  His right knee has no pain, no swelling.  NV intact, ROM of the right knee is full.  He is using his crutches.  X-rays were done today, reported separately.  Encounter Diagnosis  Name Primary?  . Closed fracture of lateral portion of left tibial plateau with routine healing, subsequent encounter Yes   I do not appreciate any fracture line on the plain x-rays today.  He can come off his crutches, begin weight bearing as tolerated.  Return in two weeks.  X-rays on return.  Call if any problem.  Precautions discussed.    Electronically Signed Darreld McleanWayne Jenavieve Freda, MD 7/17/20188:34 AM

## 2017-07-04 ENCOUNTER — Other Ambulatory Visit: Payer: Self-pay | Admitting: Orthopaedic Surgery

## 2017-07-04 ENCOUNTER — Ambulatory Visit (INDEPENDENT_AMBULATORY_CARE_PROVIDER_SITE_OTHER): Payer: Self-pay | Admitting: Orthopaedic Surgery

## 2017-07-04 ENCOUNTER — Encounter: Payer: Self-pay | Admitting: Orthopaedic Surgery

## 2017-07-04 ENCOUNTER — Ambulatory Visit: Payer: Medicaid Other

## 2017-07-04 ENCOUNTER — Ambulatory Visit (INDEPENDENT_AMBULATORY_CARE_PROVIDER_SITE_OTHER): Payer: Medicaid Other

## 2017-07-04 VITALS — BP 121/85 | HR 59 | Temp 97.8°F | Ht 70.0 in | Wt 231.0 lb

## 2017-07-04 DIAGNOSIS — S82122D Displaced fracture of lateral condyle of left tibia, subsequent encounter for closed fracture with routine healing: Secondary | ICD-10-CM

## 2017-07-04 DIAGNOSIS — S82121D Displaced fracture of lateral condyle of right tibia, subsequent encounter for closed fracture with routine healing: Secondary | ICD-10-CM

## 2017-07-04 NOTE — Patient Instructions (Signed)
Resume normal work and normal activities.

## 2017-07-04 NOTE — Progress Notes (Signed)
CC:  My knee does not hurt at all  He has no pain, full motion.  NV intact.  X-rays were done reported separately.  I still do not see any fracture line on the plain films.  Encounter Diagnoses  Name Primary?  . Closed fracture of lateral portion of right tibial plateau with routine healing, subsequent encounter Yes  . Closed fracture of lateral portion of left tibial plateau with routine healing, subsequent encounter    Resume normal work.  Return in one month.  No x-rays unless having pain.  Call if any problem.  Precautions discussed.   Electronically Signed Darreld McleanWayne Gwendolyn Nishi, MD 7/31/20189:05 AM

## 2017-08-01 ENCOUNTER — Ambulatory Visit: Payer: Self-pay | Admitting: Orthopaedic Surgery

## 2017-11-18 ENCOUNTER — Emergency Department (HOSPITAL_COMMUNITY): Payer: Self-pay

## 2017-11-18 ENCOUNTER — Encounter (HOSPITAL_COMMUNITY): Payer: Self-pay | Admitting: *Deleted

## 2017-11-18 ENCOUNTER — Emergency Department (HOSPITAL_COMMUNITY)
Admission: EM | Admit: 2017-11-18 | Discharge: 2017-11-19 | Disposition: A | Discharge status: Home / Self Care | Payer: Self-pay | Attending: Emergency Medicine | Admitting: Emergency Medicine

## 2017-11-18 DIAGNOSIS — M5432 Sciatica, left side: Secondary | ICD-10-CM | POA: Insufficient documentation

## 2017-11-18 DIAGNOSIS — J45909 Unspecified asthma, uncomplicated: Secondary | ICD-10-CM | POA: Insufficient documentation

## 2017-11-18 DIAGNOSIS — Z87891 Personal history of nicotine dependence: Secondary | ICD-10-CM | POA: Insufficient documentation

## 2017-11-18 MED ORDER — HYDROCODONE-ACETAMINOPHEN 5-325 MG PO TABS: 1.0000 | ORAL_TABLET | ORAL | 0 refills | Status: DC | PRN

## 2017-11-18 MED ORDER — KETOROLAC TROMETHAMINE 60 MG/2ML IM SOLN
60.0000 mg | Freq: Once | INTRAMUSCULAR | Status: AC
  Administered 2017-11-18: 60 mg via INTRAMUSCULAR
  Filled 2017-11-18: qty 2

## 2017-11-18 MED ORDER — CYCLOBENZAPRINE HCL 5 MG PO TABS: 5.0000 mg | ORAL_TABLET | Freq: Three times a day (TID) | ORAL | 0 refills | Status: DC | PRN

## 2017-11-18 MED ORDER — HYDROCODONE-ACETAMINOPHEN 5-325 MG PO TABS
1.0000 | ORAL_TABLET | Freq: Once | ORAL | Status: AC
  Administered 2017-11-18: 1 via ORAL
  Filled 2017-11-18: qty 1

## 2017-11-18 MED ORDER — PREDNISONE 10 MG PO TABS: ORAL_TABLET | ORAL | 0 refills | Status: DC

## 2017-11-18 NOTE — ED Provider Notes (Signed)
[facility] EMERGENCY DEPARTMENT Provider Note   CSN: [id number] Arrival date & time: 11/18/17  2134     History   Chief Complaint Chief Complaint  Patient presents with  . Back Pain    HPI Doris CheadleKenneth W Langham is a 26 y.o. male presenting with acute low back pain which has which has been present for the past 2 days.   He reports a history of occasional low back pain and was undergoing evaluation for a possible herniated lumbar disc 3 years ago prior to losing his health insurance and never was able to get an MRI.  He does report falling 3 days ago when he was stacking wood, landing directly on his lower back, did not have immediate pain but woke the next day with this low back pain.  There is radiation of pain into the left posterior buttock.  There has been no weakness or numbness in the lower extremities and no urinary or bowel retention or incontinence.  Patient does not have a history of cancer or IVDU.  The patient has tried Aleve and ibuprofen without significant relief of symptoms. .  The history is provided by the patient and the spouse.    Past Medical History:  Diagnosis Date  . Asthma   . Depression     Patient Active Problem List   Diagnosis Date Noted  . Chest pain, unspecified 01/31/2014  . Other and unspecified noninfectious gastroenteritis and colitis(558.9) 01/31/2014    History reviewed. No pertinent surgical history.     Home Medications    Prior to Admission medications   Medication Sig Start Date End Date Taking? Authorizing Provider  cyclobenzaprine (FLEXERIL) 5 MG tablet Take 1 tablet (5 mg total) by mouth 3 (three) times daily as needed for muscle spasms. 11/18/17   Burgess AmorIdol, Kameren Pargas, PA-C  gabapentin (NEURONTIN) 100 MG capsule Take 1 capsule (100 mg total) by mouth 3 (three) times daily. Patient not taking: Reported on 03/27/2016 12/01/14   Vickki HearingHarrison, Stanley E, MD  HYDROcodone-acetaminophen (NORCO/VICODIN) 5-325 MG tablet Take 1 tablet by mouth every 4  (four) hours as needed. 11/18/17   Burgess AmorIdol, Alyxander Kollmann, PA-C  ibuprofen (ADVIL,MOTRIN) 600 MG tablet Take 1 tablet (600 mg total) by mouth every 6 (six) hours as needed. Patient not taking: Reported on 04/25/2017 07/16/16   Fayrene Helperran, Bowie, PA-C  methocarbamol (ROBAXIN) 500 MG tablet Take 1 tablet (500 mg total) by mouth every 8 (eight) hours as needed for muscle spasms. Patient not taking: Reported on 12/01/2014 05/15/14   Loren RacerYelverton, David, MD  naproxen (NAPROSYN) 500 MG tablet Take 1 tablet (500 mg total) by mouth 2 (two) times daily. Patient not taking: Reported on 03/27/2016 12/01/14   Vickki HearingHarrison, Stanley E, MD  orphenadrine (NORFLEX) 100 MG tablet Take 1 tablet (100 mg total) by mouth 2 (two) times daily. Patient not taking: Reported on 12/01/2014 11/15/14   Dione BoozeGlick, David, MD  oxyCODONE-acetaminophen (PERCOCET) 5-325 MG per tablet Take 1 tablet by mouth every 4 (four) hours as needed for moderate pain. Patient not taking: Reported on 12/01/2014 11/15/14   Dione BoozeGlick, David, MD  predniSONE (DELTASONE) 10 MG tablet Take 6 tablets day one, 5 tablets day two, 4 tablets day three, 3 tablets day four, 2 tablets day five, then 1 tablet day six 11/18/17   Burgess AmorIdol, Marjie Chea, PA-C    Family History History reviewed. No pertinent family history.  Social History Social History   Tobacco Use  . Smoking status: Former Games developermoker  . Smokeless tobacco: Never Used  Substance Use  Topics  . Alcohol use: Yes    Comment: occ.   . Drug use: No     Allergies   Patient has no known allergies.   Review of Systems Review of Systems  Constitutional: Negative for fever.  Respiratory: Negative for shortness of breath.   Cardiovascular: Negative for chest pain and leg swelling.  Gastrointestinal: Negative for abdominal distention, abdominal pain and constipation.  Genitourinary: Negative for difficulty urinating, dysuria, flank pain, frequency and urgency.  Musculoskeletal: Positive for back pain. Negative for gait problem and joint  swelling.  Skin: Negative for rash.  Neurological: Negative for weakness and numbness.     Physical Exam Updated Vital Signs BP (!) 144/69 (BP Location: Left Arm)   Pulse 89   Temp 98.4 F (36.9 C) (Oral)   Resp 16   Wt 106.6 kg (235 lb)   SpO2 99%   BMI 33.72 kg/m   Physical Exam  Constitutional: He appears well-developed and well-nourished.  HENT:  Head: Normocephalic.  Eyes: Conjunctivae are normal.  Neck: Normal range of motion. Neck supple.  Cardiovascular: Normal rate and intact distal pulses.  Pedal pulses normal.  Pulmonary/Chest: Effort normal.  Abdominal: Soft. Bowel sounds are normal. He exhibits no distension and no mass.  Musculoskeletal: Normal range of motion. He exhibits no edema.       Lumbar back: He exhibits bony tenderness. He exhibits no swelling, no edema and no spasm.       Back:  Neurological: He is alert. He has normal strength. He displays no atrophy and no tremor. No sensory deficit. Gait normal.  Reflex Scores:      Patellar reflexes are 2+ on the right side and 2+ on the left side.      Achilles reflexes are 2+ on the right side and 2+ on the left side. No strength deficit noted in hip and knee flexor and extensor muscle groups.  Ankle flexion and extension intact.  Skin: Skin is warm and dry.  Psychiatric: He has a normal mood and affect.  Nursing note and vitals reviewed.    ED Treatments / Results  Labs (all labs ordered are listed, but only abnormal results are displayed) Labs Reviewed - No data to display  EKG  EKG Interpretation None       Radiology Dg Lumbar Spine Complete  Result Date: 11/18/2017 CLINICAL DATA:  Lumbosacral back pain radiating to the left hip. Fall 3 days ago loading wood onto a trailer. EXAM: LUMBAR SPINE - COMPLETE 4+ VIEW COMPARISON:  Radiographs 11/15/2014 FINDINGS: The alignment is maintained. Vertebral body heights mild endplate spurring at L4-L5, new from prior. Disc spaces are preserved. No  fracture. Sacroiliac joints are symmetric and normal. IMPRESSION: 1. No fracture or acute osseous abnormality. 2. Mild L4-L5 endplate spurring, new from prior exam. Electronically Signed   By: Rubye OaksMelanie  Ehinger M.D.   On: 11/18/2017 23:25    Procedures Procedures (including critical care time)  Medications Ordered in ED Medications  ketorolac (TORADOL) injection 60 mg (60 mg Intramuscular Given 11/18/17 2258)  HYDROcodone-acetaminophen (NORCO/VICODIN) 5-325 MG per tablet 1 tablet (1 tablet Oral Given 11/18/17 2258)     Initial Impression / Assessment and Plan / ED Course  I have reviewed the triage vital signs and the nursing notes.  Pertinent labs & imaging results that were available during my care of the patient were reviewed by me and considered in my medical decision making (see chart for details).     No neuro deficit on exam  or by history to suggest emergent or surgical presentation.  discussed worsened sx that should prompt immediate re-evaluation including distal weakness, bowel/bladder retention/incontinence.        Final Clinical Impressions(s) / ED Diagnoses   Final diagnoses:  Sciatica of left side    ED Discharge Orders        Ordered    predniSONE (DELTASONE) 10 MG tablet     11/18/17 2341    cyclobenzaprine (FLEXERIL) 5 MG tablet  3 times daily PRN     11/18/17 2341    HYDROcodone-acetaminophen (NORCO/VICODIN) 5-325 MG tablet  Every 4 hours PRN     11/18/17 2341       Burgess Amor, PA-C 11/18/17 2343    Pricilla Loveless, MD 11/19/17 [phone]

## 2017-11-18 NOTE — Discharge Instructions (Signed)
Take the medicines as directed.  Do not drive within 4 hours of taking hydrocodone as this will make you drowsy.  Avoid lifting,  Bending,  Twisting or any other activity that worsens your pain over the next week.  Apply an  icepack  to your lower back for 10-15 minutes every 2 hours for the next 2 days.  You should get rechecked if your symptoms are not better over the next 5 days,  Or you develop increased pain,  Weakness in your leg(s) or loss of bladder or bowel function - these are symptoms of a worse injury. Your xray is stable tonight.

## 2017-11-18 NOTE — ED Triage Notes (Signed)
Pt fell 3 days ago, 2 days c/o lower back pain radiates down left leg.  Denies incont. Of urine or bowels

## 2018-07-11 ENCOUNTER — Emergency Department (HOSPITAL_COMMUNITY)
Admission: EM | Admit: 2018-07-11 | Discharge: 2018-07-11 | Disposition: A | Discharge status: Home / Self Care | Payer: Self-pay | Attending: Emergency Medicine | Admitting: Emergency Medicine

## 2018-07-11 ENCOUNTER — Encounter (HOSPITAL_COMMUNITY): Payer: Self-pay | Admitting: Emergency Medicine

## 2018-07-11 ENCOUNTER — Other Ambulatory Visit: Payer: Self-pay

## 2018-07-11 DIAGNOSIS — Z87891 Personal history of nicotine dependence: Secondary | ICD-10-CM | POA: Insufficient documentation

## 2018-07-11 DIAGNOSIS — J45909 Unspecified asthma, uncomplicated: Secondary | ICD-10-CM | POA: Insufficient documentation

## 2018-07-11 DIAGNOSIS — R42 Dizziness and giddiness: Secondary | ICD-10-CM

## 2018-07-11 DIAGNOSIS — I951 Orthostatic hypotension: Secondary | ICD-10-CM | POA: Insufficient documentation

## 2018-07-11 LAB — BASIC METABOLIC PANEL
ANION GAP: 7 (ref 5–15)
BUN: 14 mg/dL (ref 6–20)
CHLORIDE: 104 mmol/L (ref 98–111)
CO2: 25 mmol/L (ref 22–32)
Calcium: 8.9 mg/dL (ref 8.9–10.3)
Creatinine, Ser: 0.88 mg/dL (ref 0.61–1.24)
GFR calc Af Amer: >60 mL/min (ref >60)
GFR calc non Af Amer: >60 mL/min (ref >60)
GLUCOSE: 97 mg/dL (ref 70–99)
POTASSIUM: 4.2 mmol/L (ref 3.5–5.1)
Sodium: 136 mmol/L (ref 135–145)

## 2018-07-11 LAB — CBC WITH DIFFERENTIAL/PLATELET
Basophils Absolute: 0.1 10*3/uL (ref 0.0–0.1)
Basophils Relative: 1 %
EOS PCT: 4 %
Eosinophils Absolute: 0.3 10*3/uL (ref 0.0–0.7)
HEMATOCRIT: 44 % (ref 39.0–52.0)
HEMOGLOBIN: 14.8 g/dL (ref 13.0–17.0)
LYMPHS ABS: 2.2 10*3/uL (ref 0.7–4.0)
LYMPHS PCT: 34 %
MCH: 29.8 pg (ref 26.0–34.0)
MCHC: 33.6 g/dL (ref 30.0–36.0)
MCV: 88.5 fL (ref 78.0–100.0)
Monocytes Absolute: 0.6 10*3/uL (ref 0.1–1.0)
Monocytes Relative: 8 %
NEUTROS ABS: 3.6 10*3/uL (ref 1.7–7.7)
NEUTROS PCT: 53 %
Platelets: 225 10*3/uL (ref 150–400)
RBC: 4.97 MIL/uL (ref 4.22–5.81)
RDW: 12.3 % (ref 11.5–15.5)
WBC: 6.7 10*3/uL (ref 4.0–10.5)

## 2018-07-11 LAB — TROPONIN I: Troponin I: <0.03 ng/mL (ref <0.03)

## 2018-07-11 NOTE — ED Provider Notes (Signed)
[facility] EMERGENCY DEPARTMENT Provider Note   CSN: [id number] Arrival date & time: 07/11/18  [id number]     History   Chief Complaint Chief Complaint  Patient presents with  . Dizziness    Ernest Cochran is a 27 y.o. male.  Ernest Patient presents with episodes of lightheadedness.  States when he switches positions from sitting to standing or laying to standing he will feel like he is going to pass out.  States he actually did fall yesterday.  These have been going on and off for the last few months.  No trauma.  States in around 30 seconds he will feel better.  He is not on any medicines.  He does not see a doctor.  No fevers or chills.  He has  stop drinking sugary sodas.  No weight change.  States he has had episodes of chest pain while he is been at work.  States he works as a [employer] and in the middle of it will get chest pain.  Then resolves.  No family history of early cardiac disease.  Smokes marijuana but no cigarettes. Past Medical History:  Diagnosis Date  . Asthma   . Depression     Patient Active Problem List   Diagnosis Date Noted  . Chest pain, unspecified 01/31/2014  . Other and unspecified noninfectious gastroenteritis and colitis(558.9) 01/31/2014    History reviewed. No pertinent surgical history.      Home Medications    Prior to Admission medications   Medication Sig Start Date End Date Taking? Authorizing Provider  cyclobenzaprine (FLEXERIL) 5 MG tablet Take 1 tablet (5 mg total) by mouth 3 (three) times daily as needed for muscle spasms. Patient not taking: Reported on 07/11/2018 11/18/17   Burgess AmorIdol, Julie, PA-C  gabapentin (NEURONTIN) 100 MG capsule Take 1 capsule (100 mg total) by mouth 3 (three) times daily. Patient not taking: Reported on 03/27/2016 12/01/14   Vickki HearingHarrison, Stanley E, MD  HYDROcodone-acetaminophen (NORCO/VICODIN) 5-325 MG tablet Take 1 tablet by mouth every 4 (four) hours as needed. Patient not taking: Reported on 07/11/2018 11/18/17    Burgess AmorIdol, Julie, PA-C  ibuprofen (ADVIL,MOTRIN) 600 MG tablet Take 1 tablet (600 mg total) by mouth every 6 (six) hours as needed. Patient not taking: Reported on 04/25/2017 07/16/16   Fayrene Helperran, Bowie, PA-C  methocarbamol (ROBAXIN) 500 MG tablet Take 1 tablet (500 mg total) by mouth every 8 (eight) hours as needed for muscle spasms. Patient not taking: Reported on 12/01/2014 05/15/14   Loren RacerYelverton, David, MD  naproxen (NAPROSYN) 500 MG tablet Take 1 tablet (500 mg total) by mouth 2 (two) times daily. Patient not taking: Reported on 03/27/2016 12/01/14   Vickki HearingHarrison, Stanley E, MD  orphenadrine (NORFLEX) 100 MG tablet Take 1 tablet (100 mg total) by mouth 2 (two) times daily. Patient not taking: Reported on 12/01/2014 11/15/14   Dione BoozeGlick, David, MD  oxyCODONE-acetaminophen (PERCOCET) 5-325 MG per tablet Take 1 tablet by mouth every 4 (four) hours as needed for moderate pain. Patient not taking: Reported on 12/01/2014 11/15/14   Dione BoozeGlick, David, MD  predniSONE (DELTASONE) 10 MG tablet Take 6 tablets day one, 5 tablets day two, 4 tablets day three, 3 tablets day four, 2 tablets day five, then 1 tablet day six Patient not taking: Reported on 07/11/2018 11/18/17   Burgess AmorIdol, Julie, PA-C    Family History History reviewed. No pertinent family history.  Social History Social History   Tobacco Use  . Smoking status: Former Games developermoker  . Smokeless tobacco: Never  Used  Substance Use Topics  . Alcohol use: Yes    Comment: occ.   . Drug use: Yes    Types: Marijuana     Allergies   Patient has no known allergies.   Review of Systems Review of Systems  Constitutional: Negative for appetite change and unexpected weight change.  HENT: Negative for congestion.   Respiratory: Negative for cough.   Cardiovascular: Positive for chest pain.  Gastrointestinal: Negative for abdominal pain.  Genitourinary: Negative for flank pain.  Musculoskeletal: Negative for back pain.  Neurological: Positive for light-headedness.    Hematological: Negative for adenopathy.  Psychiatric/Behavioral: Negative for confusion.     Physical Exam Updated Vital Signs BP 115/65   Pulse 79   Temp 98.1 F (36.7 C) (Oral)   Resp 20   Ht 5' 10.5" (1.791 m)   Wt 104.3 kg (230 lb)   SpO2 98%   BMI 32.54 kg/m   Physical Exam  Constitutional: He appears well-developed.  HENT:  Head: Atraumatic.  Eyes: Pupils are equal, round, and reactive to light.  Neck: Neck supple.  Cardiovascular: Normal rate.  No murmur heard. Pulmonary/Chest: Effort normal.  Abdominal: Soft. There is no tenderness.  Musculoskeletal: He exhibits no edema.  Neurological: He is alert.  Skin: Skin is warm. Capillary refill takes less than 2 seconds.     ED Treatments / Results  Labs (all labs ordered are listed, but only abnormal results are displayed) Labs Reviewed  CBC WITH DIFFERENTIAL/PLATELET  TROPONIN I  BASIC METABOLIC PANEL    EKG EKG Interpretation  Date/Time:  Wednesday July 11 2018 08:37:57 EDT Ventricular Rate:  73 PR Interval:    QRS Duration: 97 QT Interval:  397 QTC Calculation: 438 R Axis:   81 Text Interpretation:  Sinus rhythm Confirmed by Benjiman Core [id number]) on 07/11/2018 9:07:36 AM   Radiology No results found.  Procedures Procedures (including critical care time)  Medications Ordered in ED Medications - No data to display   Initial Impression / Assessment and Plan / ED Course  I have reviewed the triage vital signs and the nursing notes.  Pertinent labs & imaging results that were available during my care of the patient were reviewed by me and considered in my medical decision making (see chart for details).     Patient with orthostatic episodes.  Not orthostatic here.  Lab work reassuring.  Also episodes of chest pain with exertion at times but seems to only be while at work.  However states he does not do much other exertion.  No early family history of cardiac disease.  Also fingers will  tingle sometimes.  Benign exam here.  Lab work reassuring.  Discharged home to follow-up with PCP as needed.  Final Clinical Impressions(s) / ED Diagnoses   Final diagnoses:  Orthostatic dizziness    ED Discharge Orders    None       Benjiman Core, MD 07/11/18 [phone]

## 2018-07-11 NOTE — ED Triage Notes (Signed)
Pt states that he has been having dizziness for months but he has ignored it but he fell yesterday. Not dizzy at this time. He has been having problems with his fingers are tingling at times

## 2018-07-11 NOTE — ED Notes (Signed)
Per pt wife, pt fingers went numb "for a little bit" around 0913.

## 2018-09-23 ENCOUNTER — Other Ambulatory Visit: Payer: Self-pay

## 2018-09-23 ENCOUNTER — Emergency Department (HOSPITAL_COMMUNITY): Payer: Medicaid Other

## 2018-09-23 ENCOUNTER — Emergency Department (HOSPITAL_COMMUNITY)
Admission: EM | Admit: 2018-09-23 | Discharge: 2018-09-23 | Disposition: A | Discharge status: Home / Self Care | Payer: Medicaid Other | Attending: Emergency Medicine | Admitting: Emergency Medicine

## 2018-09-23 ENCOUNTER — Encounter (HOSPITAL_COMMUNITY): Payer: Self-pay

## 2018-09-23 DIAGNOSIS — J069 Acute upper respiratory infection, unspecified: Secondary | ICD-10-CM | POA: Diagnosis not present

## 2018-09-23 DIAGNOSIS — M7918 Myalgia, other site: Secondary | ICD-10-CM | POA: Diagnosis not present

## 2018-09-23 DIAGNOSIS — R05 Cough: Secondary | ICD-10-CM | POA: Diagnosis present

## 2018-09-23 DIAGNOSIS — R197 Diarrhea, unspecified: Secondary | ICD-10-CM | POA: Diagnosis not present

## 2018-09-23 DIAGNOSIS — B9789 Other viral agents as the cause of diseases classified elsewhere: Secondary | ICD-10-CM

## 2018-09-23 DIAGNOSIS — J988 Other specified respiratory disorders: Secondary | ICD-10-CM

## 2018-09-23 DIAGNOSIS — R1110 Vomiting, unspecified: Secondary | ICD-10-CM

## 2018-09-23 LAB — GROUP A STREP BY PCR: GROUP A STREP BY PCR: NOT DETECTED

## 2018-09-23 MED ORDER — ONDANSETRON HCL 4 MG PO TABS: 4.0000 mg | ORAL_TABLET | Freq: Three times a day (TID) | ORAL | 0 refills | Status: DC | PRN

## 2018-09-23 MED ORDER — ALBUTEROL SULFATE (2.5 MG/3ML) 0.083% IN NEBU
5.0000 mg | INHALATION_SOLUTION | Freq: Once | RESPIRATORY_TRACT | Status: AC
  Administered 2018-09-23: 5 mg via RESPIRATORY_TRACT
  Filled 2018-09-23: qty 6

## 2018-09-23 MED ORDER — AEROCHAMBER Z-STAT PLUS/MEDIUM MISC
1.0000 | Freq: Once | Status: AC
  Administered 2018-09-23: 1

## 2018-09-23 MED ORDER — ALBUTEROL SULFATE HFA 108 (90 BASE) MCG/ACT IN AERS
2.0000 | INHALATION_SPRAY | Freq: Four times a day (QID) | RESPIRATORY_TRACT | Status: DC | PRN
Start: 2018-09-23 — End: 2018-09-23
  Administered 2018-09-23: 2 via RESPIRATORY_TRACT
  Filled 2018-09-23: qty 6.7

## 2018-09-23 NOTE — ED Triage Notes (Signed)
Pt reports body aches, fever, productive cough, nasal congestion, and headache that started about 3 days ago. Pt highest reported temp was 100.9 orally at home yesterday - pt has been taking Thera flu with some improvement.

## 2018-09-23 NOTE — ED Provider Notes (Signed)
[facility] EMERGENCY DEPARTMENT Provider Note   CSN: [id number] Arrival date & time: 09/23/18  0056  Time seen 2:20 AM   History   Chief Complaint Chief Complaint  Patient presents with  . flu like symptoms    HPI Ernest Cochran is a 27 y.o. male.  HPI patient states he started feeling bad on Wednesday, October 16.  He has had a cough of white sputum production, body aches, posterior headache when he coughs and otherwise some discomfort in his forehead.  He denies rhinorrhea or sneezing but states his nose is stuffy.  He has had some sore throat.  He states he started having nausea and vomited twice today.  He also started having diarrhea today and has had about 5 episodes of watery diarrhea.  He states he gets intermittent abdominal cramping followed by diarrhea.  He is not having abdominal pain at this time.  He states he started getting chest pain in his upper chest yesterday and it hurts to breathe and when he coughs.  He states he has been wheezing but he does not have an inhaler to use.  He has had asthma as a child and has used inhalers before.  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  He does not take the flu shot.  PCP Delorse Lek, MD   Past Medical History:  Diagnosis Date  . Asthma   . Depression     Patient Active Problem List   Diagnosis Date Noted  . Chest pain, unspecified 01/31/2014  . Other and unspecified noninfectious gastroenteritis and colitis(558.9) 01/31/2014    History reviewed. No pertinent surgical history.      Home Medications    Prior to Admission medications   Medication Sig Start Date End Date Taking? Authorizing Provider  cyclobenzaprine (FLEXERIL) 5 MG tablet Take 1 tablet (5 mg total) by mouth 3 (three) times daily as needed for muscle spasms. Patient not taking: Reported on 07/11/2018 11/18/17   Burgess Amor, PA-C  gabapentin (NEURONTIN) 100 MG capsule Take 1 capsule (100  mg total) by mouth 3 (three) times daily. Patient not taking: Reported on 03/27/2016 12/01/14   Vickki Hearing, MD  HYDROcodone-acetaminophen (NORCO/VICODIN) 5-325 MG tablet Take 1 tablet by mouth every 4 (four) hours as needed. Patient not taking: Reported on 07/11/2018 11/18/17   Burgess Amor, PA-C  ibuprofen (ADVIL,MOTRIN) 600 MG tablet Take 1 tablet (600 mg total) by mouth every 6 (six) hours as needed. Patient not taking: Reported on 04/25/2017 07/16/16   Fayrene Helper, PA-C  methocarbamol (ROBAXIN) 500 MG tablet Take 1 tablet (500 mg total) by mouth every 8 (eight) hours as needed for muscle spasms. Patient not taking: Reported on 12/01/2014 05/15/14   Loren Racer, MD  naproxen (NAPROSYN) 500 MG tablet Take 1 tablet (500 mg total) by mouth 2 (two) times daily. Patient not taking: Reported on 03/27/2016 12/01/14   Vickki Hearing, MD  ondansetron (ZOFRAN) 4 MG tablet Take 1 tablet (4 mg total) by mouth every 8 (eight) hours as needed. 09/23/18   Devoria Albe, MD  orphenadrine (NORFLEX) 100 MG tablet Take 1 tablet (100 mg total) by mouth 2 (two) times daily. Patient not taking: Reported on 12/01/2014 11/15/14   Dione Booze, MD  oxyCODONE-acetaminophen (PERCOCET) 5-325 MG per tablet Take 1 tablet by mouth every 4 (four) hours as needed for moderate pain. Patient not taking: Reported on 12/01/2014 11/15/14   Preston Fleeting,  Onalee Hua, MD  predniSONE (DELTASONE) 10 MG tablet Take 6 tablets day one, 5 tablets day two, 4 tablets day three, 3 tablets day four, 2 tablets day five, then 1 tablet day six Patient not taking: Reported on 07/11/2018 11/18/17   Burgess Amor, PA-C    Family History No family history on file.  Social History Social History   Tobacco Use  . Smoking status: Former Smoker    Types: Cigarettes  . Smokeless tobacco: Never Used  Substance Use Topics  . Alcohol use: Yes    Comment: occ.   . Drug use: Yes    Types: Marijuana  employed   Allergies   Patient has no known  allergies.   Review of Systems Review of Systems  All other systems reviewed and are negative.    Physical Exam Updated Vital Signs BP 100/73   Pulse 84   Temp 98.7 F (37.1 C) (Oral)   Resp 18   Ht 5\' 10"  (1.778 m)   Wt 104.3 kg   SpO2 98%   BMI 33.00 kg/m   Physical Exam  Constitutional: He is oriented to person, place, and time. He appears well-developed and well-nourished.  Non-toxic appearance. He does not appear ill. No distress.  HENT:  Head: Normocephalic and atraumatic.  Right Ear: External ear normal.  Left Ear: External ear normal.  Nose: Nose normal. No mucosal edema or rhinorrhea.  Mouth/Throat: Oropharynx is clear and moist and mucous membranes are normal. No dental abscesses or uvula swelling.  Eyes: Pupils are equal, round, and reactive to light. Conjunctivae and EOM are normal.  Neck: Normal range of motion and full passive range of motion without pain. Neck supple.  Cardiovascular: Normal rate, regular rhythm and normal heart sounds. Exam reveals no gallop and no friction rub.  No murmur heard. Pulmonary/Chest: Effort normal and breath sounds normal. No respiratory distress. He has no wheezes. He has no rhonchi. He has no rales. He exhibits no tenderness and no crepitus.  Abdominal: Soft. Normal appearance and bowel sounds are normal. He exhibits no distension. There is no tenderness. There is no rebound and no guarding.  Musculoskeletal: Normal range of motion. He exhibits no edema or tenderness.  Moves all extremities well.   Neurological: He is alert and oriented to person, place, and time. He has normal strength. No cranial nerve deficit.  Skin: Skin is warm, dry and intact. No rash noted. No erythema. No pallor.  Psychiatric: He has a normal mood and affect. His speech is normal and behavior is normal. His mood appears not anxious.  Nursing note and vitals reviewed.    ED Treatments / Results  Labs (all labs ordered are listed, but only abnormal  results are displayed) Results for orders placed or performed during the hospital encounter of 09/23/18  Group A Strep by PCR  Result Value Ref Range   Group A Strep by PCR NOT DETECTED NOT DETECTED   Laboratory interpretation all normal     EKG None  Radiology Dg Chest 2 View  Result Date: 09/23/2018 CLINICAL DATA:  Cough and fever. EXAM: CHEST - 2 VIEW COMPARISON:  12/23/2016 FINDINGS: The cardiomediastinal contours are normal. The lungs are clear. Pulmonary vasculature is normal. No consolidation, pleural effusion, or pneumothorax. No acute osseous abnormalities are seen. IMPRESSION: Negative radiographs of the chest. Electronically Signed   By: Narda Rutherford M.D.   On: 09/23/2018 02:43    Procedures Procedures (including critical care time)  Medications Ordered in ED Medications  albuterol (  PROVENTIL HFA;VENTOLIN HFA) 108 (90 Base) MCG/ACT inhaler 2 puff (has no administration in time range)  aerochamber Z-Stat Plus/medium 1 each (has no administration in time range)  albuterol (PROVENTIL) (2.5 MG/3ML) 0.083% nebulizer solution 5 mg (5 mg Nebulization Given 09/23/18 0308)     Initial Impression / Assessment and Plan / ED Course  I have reviewed the triage vital signs and the nursing notes.  Pertinent labs & imaging results that were available during my care of the patient were reviewed by me and considered in my medical decision making (see chart for details).    I did an albuterol treatment although patient was not wheezing to see if that would help him symptomatically.  Strep screen was done.  Chest x-ray was done to look for pneumonia.  I did not testing for influenza because he is way out of the window for treatment.  Recheck at 3:40 AM when I listen to patient now he has improved air movement.  He states his chest feels less tight.  We discussed his test results which was negative for strep and negative for pneumonia.  I talked to him about just doing symptomatic  care and if he continued 10 used to have symptoms after 7 to 10 days at that point they may consider doing antibiotics.   Final Clinical Impressions(s) / ED Diagnoses   Final diagnoses:  Viral respiratory illness  Vomiting and diarrhea    ED Discharge Orders         Ordered    ondansetron (ZOFRAN) 4 MG tablet  Every 8 hours PRN     09/23/18 0351        OTC mucinex DM, ibuprofen and acetaminophen  Plan discharge  Devoria Albe, MD, Concha Pyo, MD 09/23/18 [phone]

## 2018-09-23 NOTE — Discharge Instructions (Addendum)
Drink plenty of fluids (clear liquids) then start a bland diet later this morning such as toast, crackers, jello, [employer] chicken noodle soup. Use the zofran for nausea or vomiting. Take imodium OTC for diarrhea. Avoid milk products until the diarrhea is gone.  You can take Mucinex DM over-the-counter for your cough.  You can take ibuprofen 600 mg plus acetaminophen 650 mg every 6 hours as needed for body aches or fever.  Let your doctor know if you are not improving by the end of the week, at that point they may want to start you on antibiotics.  Unfortunately is too late to start you on the flu medication.  It needs to be started within 48 hours of having symptoms start.  Use the albuterol inhaler when you feel like you are wheezing or your chest feels tight.

## 2018-12-05 DIAGNOSIS — K200 Eosinophilic esophagitis: Secondary | ICD-10-CM

## 2018-12-05 HISTORY — DX: Eosinophilic esophagitis: K20.0

## 2019-02-13 ENCOUNTER — Other Ambulatory Visit: Payer: Self-pay

## 2019-02-13 ENCOUNTER — Encounter (HOSPITAL_COMMUNITY): Payer: Self-pay | Admitting: Emergency Medicine

## 2019-02-13 ENCOUNTER — Emergency Department (HOSPITAL_COMMUNITY): Payer: Medicaid Other

## 2019-02-13 ENCOUNTER — Emergency Department (HOSPITAL_COMMUNITY)
Admission: EM | Admit: 2019-02-13 | Discharge: 2019-02-13 | Disposition: A | Discharge status: Home / Self Care | Payer: Medicaid Other | Attending: Emergency Medicine | Admitting: Emergency Medicine

## 2019-02-13 DIAGNOSIS — J45909 Unspecified asthma, uncomplicated: Secondary | ICD-10-CM | POA: Insufficient documentation

## 2019-02-13 DIAGNOSIS — Z87891 Personal history of nicotine dependence: Secondary | ICD-10-CM | POA: Insufficient documentation

## 2019-02-13 DIAGNOSIS — N200 Calculus of kidney: Secondary | ICD-10-CM | POA: Diagnosis not present

## 2019-02-13 DIAGNOSIS — M545 Low back pain, unspecified: Secondary | ICD-10-CM

## 2019-02-13 HISTORY — DX: Disorder of kidney and ureter, unspecified: N28.9

## 2019-02-13 LAB — CBC WITH DIFFERENTIAL/PLATELET
ABS IMMATURE GRANULOCYTES: 0.12 10*3/uL — AB (ref 0.00–0.07)
BASOS PCT: 1 %
Basophils Absolute: 0.1 10*3/uL (ref 0.0–0.1)
EOS PCT: 1 %
Eosinophils Absolute: 0.1 10*3/uL (ref 0.0–0.5)
HCT: 46.9 % (ref 39.0–52.0)
Hemoglobin: 15.4 g/dL (ref 13.0–17.0)
Immature Granulocytes: 1 %
LYMPHS PCT: 6 %
Lymphs Abs: 0.5 10*3/uL — ABNORMAL LOW (ref 0.7–4.0)
MCH: 29 pg (ref 26.0–34.0)
MCHC: 32.8 g/dL (ref 30.0–36.0)
MCV: 88.3 fL (ref 80.0–100.0)
MONO ABS: 0.9 10*3/uL (ref 0.1–1.0)
MONOS PCT: 10 %
NEUTROS ABS: 7.3 10*3/uL (ref 1.7–7.7)
Neutrophils Relative %: 81 %
Platelets: 258 10*3/uL (ref 150–400)
RBC: 5.31 MIL/uL (ref 4.22–5.81)
RDW: 11.9 % (ref 11.5–15.5)
WBC: 9 10*3/uL (ref 4.0–10.5)
nRBC: 0 % (ref 0.0–0.2)

## 2019-02-13 LAB — BASIC METABOLIC PANEL
Anion gap: 11 (ref 5–15)
BUN: 12 mg/dL (ref 6–20)
CHLORIDE: 100 mmol/L (ref 98–111)
CO2: 24 mmol/L (ref 22–32)
CREATININE: 0.92 mg/dL (ref 0.61–1.24)
Calcium: 9.3 mg/dL (ref 8.9–10.3)
GFR calc Af Amer: >60 mL/min (ref >60)
GFR calc non Af Amer: >60 mL/min (ref >60)
GLUCOSE: 101 mg/dL — AB (ref 70–99)
POTASSIUM: 3.8 mmol/L (ref 3.5–5.1)
SODIUM: 135 mmol/L (ref 135–145)

## 2019-02-13 LAB — HEPATIC FUNCTION PANEL
ALK PHOS: 103 U/L (ref 38–126)
ALT: 28 U/L (ref 0–44)
AST: 25 U/L (ref 15–41)
Albumin: 4.4 g/dL (ref 3.5–5.0)
Bilirubin, Direct: 0.1 mg/dL (ref 0.0–0.2)
Indirect Bilirubin: 0.3 mg/dL (ref 0.3–0.9)
Total Bilirubin: 0.4 mg/dL (ref 0.3–1.2)
Total Protein: 8 g/dL (ref 6.5–8.1)

## 2019-02-13 LAB — URINALYSIS, ROUTINE W REFLEX MICROSCOPIC
BACTERIA UA: NONE SEEN
BILIRUBIN URINE: NEGATIVE
GLUCOSE, UA: NEGATIVE mg/dL
Ketones, ur: 20 mg/dL — AB
LEUKOCYTE UA: NEGATIVE
Nitrite: NEGATIVE
PROTEIN: NEGATIVE mg/dL
Specific Gravity, Urine: 1.021 (ref 1.005–1.030)
pH: 8 (ref 5.0–8.0)

## 2019-02-13 LAB — LACTIC ACID, PLASMA: Lactic Acid, Venous: 1.7 mmol/L (ref 0.5–1.9)

## 2019-02-13 LAB — LIPASE, BLOOD: Lipase: 24 U/L (ref 11–51)

## 2019-02-13 MED ORDER — ONDANSETRON 8 MG PO TBDP
8.0000 mg | ORAL_TABLET | Freq: Once | ORAL | Status: AC
  Administered 2019-02-13: 8 mg via ORAL
  Filled 2019-02-13: qty 1

## 2019-02-13 MED ORDER — SODIUM CHLORIDE 0.9 % IV BOLUS: 1000.0000 mL | Freq: Once | INTRAVENOUS | Status: DC

## 2019-02-13 MED ORDER — ONDANSETRON 4 MG PO TBDP: 4.0000 mg | ORAL_TABLET | Freq: Three times a day (TID) | ORAL | 0 refills | Status: DC | PRN

## 2019-02-13 MED ORDER — KETOROLAC TROMETHAMINE 60 MG/2ML IM SOLN
60.0000 mg | Freq: Once | INTRAMUSCULAR | Status: AC
  Administered 2019-02-13: 60 mg via INTRAMUSCULAR
  Filled 2019-02-13: qty 2

## 2019-02-13 MED ORDER — ACETAMINOPHEN 500 MG PO TABS
1000.0000 mg | ORAL_TABLET | Freq: Once | ORAL | Status: AC
  Administered 2019-02-13: 1000 mg via ORAL
  Filled 2019-02-13: qty 2

## 2019-02-13 NOTE — ED Provider Notes (Signed)
[facility] EMERGENCY DEPARTMENT Provider Note   CSN: [id number] Arrival date & time: 02/13/19  1950    History   Chief Complaint Chief Complaint  Patient presents with   Flank Pain    HPI Ernest Cochran is a 28 y.o. male.     HPI Pt was seen at 2205. Per pt, c/o sudden onset and persistence of constant right > left sided low back "pain" that began this morning approximately 10am.  Pt describes the pain as "like my last kidney stone." Has been associated with 2 episodes of N/V.  Denies testicular pain/swelling, no dysuria/hematuria, no abd pain, no diarrhea, no black or blood in emesis, no CP/SOB, no fevers, no rash.    Past Medical History:  Diagnosis Date   Asthma    Depression    Renal disorder    kidney stone    Patient Active Problem List   Diagnosis Date Noted   Chest pain, unspecified 01/31/2014   Other and unspecified noninfectious gastroenteritis and colitis(558.9) 01/31/2014    History reviewed. No pertinent surgical history.      Home Medications    Prior to Admission medications   Not on File    Family History History reviewed. No pertinent family history.  Social History Social History   Tobacco Use   Smoking status: Former Smoker    Types: Cigarettes   Smokeless tobacco: Never Used  Substance Use Topics   Alcohol use: Yes    Comment: occ.    Drug use: Not Currently    Types: Marijuana     Allergies   Patient has no known allergies.   Review of Systems Review of Systems ROS: Statement: All systems negative except as marked or noted in the HPI; Constitutional: Negative for fever and chills. ; ; Eyes: Negative for eye pain, redness and discharge. ; ; ENMT: Negative for ear pain, hoarseness, nasal congestion, sinus pressure and sore throat. ; ; Cardiovascular: Negative for chest pain, palpitations, diaphoresis, dyspnea and peripheral edema. ; ; Respiratory: Negative for cough, wheezing and stridor. ; ; Gastrointestinal:  Negative for nausea, vomiting, diarrhea, abdominal pain, blood in stool, hematemesis, jaundice and rectal bleeding. . ; ; Genitourinary: Negative for dysuria and hematuria. ; ; Genital:  No penile drainage or rash, no testicular pain or swelling, no scrotal rash or swelling. ;; Musculoskeletal: +LBP. Negative for neck pain. Negative for swelling and trauma.; ; Skin: Negative for pruritus, rash, abrasions, blisters, bruising and skin lesion.; ; Neuro: Negative for headache, lightheadedness and neck stiffness. Negative for weakness, altered level of consciousness, altered mental status, extremity weakness, paresthesias, involuntary movement, seizure and syncope.       Physical Exam Updated Vital Signs BP 128/80 (BP Location: Right Arm)    Pulse (!) 111    Temp 100.1 F (37.8 C) (Oral)    Resp 20    Ht 5\' 10"  (1.778 m)    Wt 102.1 kg    SpO2 100%    BMI 32.28 kg/m    BP 112/62 (BP Location: Right Arm)    Pulse 93    Temp 100.1 F (37.8 C) (Oral)    Resp 18    Ht 5\' 10"  (1.778 m)    Wt 102.1 kg    SpO2 97%    BMI 32.28 kg/m    Physical Exam 2210: Physical examination:  Nursing notes reviewed; Vital signs and O2 SAT reviewed;  Constitutional: Well developed, Well nourished, Well hydrated, In no acute distress; Head:  Normocephalic, atraumatic; Eyes:  EOMI, PERRL, No scleral icterus; ENMT: Mouth and pharynx normal, Mucous membranes moist; Neck: Supple, Full range of motion, No lymphadenopathy; Cardiovascular: Regular rate and rhythm, No gallop; Respiratory: Breath sounds clear & equal bilaterally, No wheezes.  Speaking full sentences with ease, Normal respiratory effort/excursion; Chest: Nontender, Movement normal; Abdomen: Soft, Nontender, Nondistended, Normal bowel sounds; Genitourinary: No CVA tenderness; Spine:  No midline CS, TS, LS tenderness. +TTP right > left lumbar paraspinal muscles.;; Extremities: Peripheral pulses normal, No tenderness, No edema, No calf edema or asymmetry.; Neuro: AA&Ox3,  Major CN grossly intact.  Speech clear. No gross focal motor or sensory deficits in extremities. Climbs on and off stretcher easily by himself. Gait steady..; Skin: Color normal, Warm, Dry.   ED Treatments / Results  Labs (all labs ordered are listed, but only abnormal results are displayed)   EKG None  Radiology   Procedures Procedures (including critical care time)  Medications Ordered in ED Medications  ondansetron (ZOFRAN-ODT) disintegrating tablet 8 mg (has no administration in time range)  ketorolac (TORADOL) injection 60 mg (has no administration in time range)  acetaminophen (TYLENOL) tablet 1,000 mg (1,000 mg Oral Given 02/13/19 2040)     Initial Impression / Assessment and Plan / ED Course  I have reviewed the triage vital signs and the nursing notes.  Pertinent labs & imaging results that were available during my care of the patient were reviewed by me and considered in my medical decision making (see chart for details).     MDM Reviewed: previous chart, nursing note and vitals Interpretation: labs and CT scan   Results for orders placed or performed during the hospital encounter of 02/13/19  Urinalysis, Routine w reflex microscopic- may I&O cath if menses  Result Value Ref Range   Color, Urine YELLOW YELLOW   APPearance CLEAR CLEAR   Specific Gravity, Urine 1.021 1.005 - 1.030   pH 8.0 5.0 - 8.0   Glucose, UA NEGATIVE NEGATIVE mg/dL   Hgb urine dipstick SMALL (A) NEGATIVE   Bilirubin Urine NEGATIVE NEGATIVE   Ketones, ur 20 (A) NEGATIVE mg/dL   Protein, ur NEGATIVE NEGATIVE mg/dL   Nitrite NEGATIVE NEGATIVE   Leukocytes,Ua NEGATIVE NEGATIVE   RBC / HPF 6-10 0 - 5 RBC/hpf   WBC, UA 0-5 0 - 5 WBC/hpf   Bacteria, UA NONE SEEN NONE SEEN   Squamous Epithelial / LPF 0-5 0 - 5  CBC with Differential  Result Value Ref Range   WBC 9.0 4.0 - 10.5 K/uL   RBC 5.31 4.22 - 5.81 MIL/uL   Hemoglobin 15.4 13.0 - 17.0 g/dL   HCT 16.1 09.6 - 04.5 %   MCV 88.3 80.0  - 100.0 fL   MCH 29.0 26.0 - 34.0 pg   MCHC 32.8 30.0 - 36.0 g/dL   RDW 40.9 81.1 - 91.4 %   Platelets 258 150 - 400 K/uL   nRBC 0.0 0.0 - 0.2 %   Neutrophils Relative % 81 %   Neutro Abs 7.3 1.7 - 7.7 K/uL   Lymphocytes Relative 6 %   Lymphs Abs 0.5 (L) 0.7 - 4.0 K/uL   Monocytes Relative 10 %   Monocytes Absolute 0.9 0.1 - 1.0 K/uL   Eosinophils Relative 1 %   Eosinophils Absolute 0.1 0.0 - 0.5 K/uL   Basophils Relative 1 %   Basophils Absolute 0.1 0.0 - 0.1 K/uL   Immature Granulocytes 1 %   Abs Immature Granulocytes 0.12 (H) 0.00 - 0.07 K/uL  Basic metabolic panel  Result Value Ref Range   Sodium 135 135 - 145 mmol/L   Potassium 3.8 3.5 - 5.1 mmol/L   Chloride 100 98 - 111 mmol/L   CO2 24 22 - 32 mmol/L   Glucose, Bld 101 (H) 70 - 99 mg/dL   BUN 12 6 - 20 mg/dL   Creatinine, Ser 1.76 0.61 - 1.24 mg/dL   Calcium 9.3 8.9 - 16.0 mg/dL   GFR calc non Af Amer >60 >60 mL/min   GFR calc Af Amer >60 >60 mL/min   Anion gap 11 5 - 15  Lactic acid, plasma  Result Value Ref Range   Lactic Acid, Venous 1.7 0.5 - 1.9 mmol/L  Lipase, blood  Result Value Ref Range   Lipase 24 11 - 51 U/L  Hepatic function panel  Result Value Ref Range   Total Protein 8.0 6.5 - 8.1 g/dL   Albumin 4.4 3.5 - 5.0 g/dL   AST 25 15 - 41 U/L   ALT 28 0 - 44 U/L   Alkaline Phosphatase 103 38 - 126 U/L   Total Bilirubin 0.4 0.3 - 1.2 mg/dL   Bilirubin, Direct 0.1 0.0 - 0.2 mg/dL   Indirect Bilirubin 0.3 0.3 - 0.9 mg/dL   Ct Renal Stone Study Result Date: 02/13/2019 CLINICAL DATA:  Flank pain starting this morning. EXAM: CT ABDOMEN AND PELVIS WITHOUT CONTRAST TECHNIQUE: Multidetector CT imaging of the abdomen and pelvis was performed following the standard protocol without IV contrast. COMPARISON:  06/10/2013 CT FINDINGS: LOWER CHEST: Lung bases are clear. Included heart size is normal. No pericardial effusion. HEPATOBILIARY: The unopacified liver is unremarkable. No intrahepatic ductal dilatation is seen.  The gallbladder is physiologically distended without calculi. PANCREAS: Normal. SPLEEN: Normal. ADRENALS/URINARY TRACT: Kidneys are orthotopic. Bilateral punctate nonobstructing renal calculi are noted. No apparent renal mass given limitations of a noncontrast study. The unopacified ureters are normal in course and caliber. No ureteral calculi. Urinary bladder is partially distended and demonstrates mild circumferential mural thickening which may be due to underdistention. A cystitis is not excluded. Normal adrenal glands. STOMACH/BOWEL: The stomach, small and large bowel are normal in course and caliber without inflammatory changes. Normal appendix. VASCULAR/LYMPHATIC: Aortoiliac vessels are normal in course and caliber. No lymphadenopathy by CT size criteria. REPRODUCTIVE: Normal. OTHER: No intraperitoneal free fluid or free air. Small periumbilical fat containing hernia. MUSCULOSKELETAL: Nonacute. IMPRESSION: Slight circumferential mural thickening of the bladder which may be due to underdistention. Cystitis is not entirely excluded. Bilateral nonobstructing renal calculi.  No ureteral calculus. Electronically Signed   By: Tollie Eth M.D.   On: 02/13/2019 23:00    2345: No UTI on Udip and pt denies dysuria; UC pending. Workup otherwise reassuring. Tx symptomatically at this time. Dx and testing d/w pt and family.  Questions answered.  Verb understanding, agreeable to d/c home with outpt f/u.      Final Clinical Impressions(s) / ED Diagnoses   Final diagnoses:  None    ED Discharge Orders    None       Samuel Jester, DO 02/14/19 2221

## 2019-02-13 NOTE — ED Triage Notes (Addendum)
Patient complaining of bilateral flank pain starting this morning. Denies dysuria. States he vomited x 2 today.

## 2019-02-13 NOTE — ED Notes (Signed)
Patient transported to CT 

## 2019-02-13 NOTE — Discharge Instructions (Signed)
Take the prescription as directed.  Take over the counter tylenol and ibuprofen, as directed on packaging, as needed for discomfort. Apply moist heat or ice to the area(s) of discomfort, for 15 minutes at a time, several times per day for the next few days.  Do not fall asleep on a heating or ice pack. Increase your fluid intake for the next few days and eat a bland diet.  Call your regular medical doctor tomorrow to schedule a follow up appointment this week.  Return to the Emergency Department immediately if worsening.

## 2019-02-15 LAB — URINE CULTURE: Culture: NO GROWTH

## 2019-06-25 DIAGNOSIS — K2970 Gastritis, unspecified, without bleeding: Secondary | ICD-10-CM | POA: Diagnosis not present

## 2019-06-25 DIAGNOSIS — B9681 Helicobacter pylori [H. pylori] as the cause of diseases classified elsewhere: Secondary | ICD-10-CM | POA: Diagnosis not present

## 2019-07-02 DIAGNOSIS — B9681 Helicobacter pylori [H. pylori] as the cause of diseases classified elsewhere: Secondary | ICD-10-CM | POA: Diagnosis not present

## 2019-07-02 DIAGNOSIS — K2970 Gastritis, unspecified, without bleeding: Secondary | ICD-10-CM | POA: Diagnosis not present

## 2019-07-10 DIAGNOSIS — K2970 Gastritis, unspecified, without bleeding: Secondary | ICD-10-CM | POA: Diagnosis not present

## 2019-07-10 DIAGNOSIS — B9681 Helicobacter pylori [H. pylori] as the cause of diseases classified elsewhere: Secondary | ICD-10-CM | POA: Diagnosis not present

## 2019-07-15 ENCOUNTER — Encounter: Payer: Self-pay | Admitting: Gastroenterology

## 2019-07-15 ENCOUNTER — Ambulatory Visit: Payer: Medicaid Other | Admitting: Gastroenterology

## 2019-07-15 ENCOUNTER — Other Ambulatory Visit: Payer: Self-pay

## 2019-07-15 VITALS — BP 126/77 | HR 74 | Temp 97.3°F | Ht 70.0 in | Wt 240.4 lb

## 2019-07-15 DIAGNOSIS — K529 Noninfective gastroenteritis and colitis, unspecified: Secondary | ICD-10-CM | POA: Insufficient documentation

## 2019-07-15 DIAGNOSIS — R1310 Dysphagia, unspecified: Secondary | ICD-10-CM

## 2019-07-15 DIAGNOSIS — R1013 Epigastric pain: Secondary | ICD-10-CM

## 2019-07-15 NOTE — Assessment & Plan Note (Signed)
In setting of chronic GERD. Continue PPI BID and pursue dilatation as appropriate at time of EGD. 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

## 2019-07-15 NOTE — Assessment & Plan Note (Signed)
Without diarrhea. Occurring postprandially without overt GI bleeding. Exposure to antibiotics previously with Prevpac. As no diarrhea, hold off on stool studies. Declining any supportive measures. Stool studies if diarrhea.

## 2019-07-15 NOTE — Assessment & Plan Note (Signed)
28 year old male with new-onset dyspepsia in July 2020, with reported positive H.pylori serology through PCP and treated with Prevpac. Continues with Prevacid BID. Improvement in GERD with Prevacid but persistent abdominal discomfort. Query gastritis, possible PUD, also with some improvement after BMs intermittently and likely dealing with IBS as well. See "frequent stools". Endorses prior use of Goody powders in past and now with Aleve and Ibuprofen twice a week for headaches. Will pursue EGD in near future due to persistent dyspepsia and dilatation due to solid food dysphagia.   Proceed with upper endoscopy/dilatation in the near future with Dr. Oneida Alar. The risks, benefits, and alternatives have been discussed in detail with patient. They have stated understanding and desire to proceed.  Propofol due to past history of ETOH and marijuana Continue Prevacid BID Avoid NSAIDs Follow-up thereafter

## 2019-07-15 NOTE — Progress Notes (Addendum)
REVIEWED-NO ADDITIONAL RECOMMENDATIONS.  Primary Care Physician:  Leisa Lenz, MD Primary Gastroenterologist:  Dr. Oneida Alar   Chief Complaint  Patient presents with  . Gastroesophageal Reflux    Prevacid helping  . Abdominal Pain    mid abd, sometimes upper mid abd    HPI:   Ernest Cochran is a 28 y.o. male presenting today at the request of Dr. Jomarie Longs due to abdominal pain, history of +H.pylori s/p Prevpac treatment, and frequent stool.    N/V a week ago but now improved.   Symptoms first started around end of July 2020. Severe abdominal pain, diarrhea at the time, mild nausea, a few isolated episodes of vomiting. Pain was intermittent, waxing and waning. Not exacerbated by eating. Worse in evenings at bedtime and in the morning. Did H.pylori blood test and was positive. Took whole round of Prevpac. Still remains on Prevacid twice a day. Symptoms about the same post antibiotic therapy.   Mostly pain periumbilically and LUQ, mainly at level of umbilicus. +GERD, chronic. Was taking Nexium OTC, which helped mildly in the past. Prevacid works well. No further nocturnal reflux. GERD symptoms not as bad now. Has had solid food dysphagia for awhile. If eats too fast, can't swallow and feels like air bubble. Used to eating on the job and has to eat fast. Has to drink large amount of water behind it, sometimes has to puke it up. Painful when food is passing down. Chronic dysphagia. Works as Network engineer.   Frequent BMs since mid July, worsened towards end of July. Postprandial Bms. Ranges between 3-5. Sometimes abdominal pain prior to BM, sometimes after. Sometimes pain goes away, sometimes still there. No rectal bleeding.   Takes Aleve and Ibuprofen prn for headaches, about twice a week. Used to take Goody powders in setting of significant headaches.   No weight loss. Stays between 230 and 240.   Past Medical History:  Diagnosis Date  . Asthma   . Depression   . GERD  (gastroesophageal reflux disease)   . Renal disorder    kidney stone    Past Surgical History:  Procedure Laterality Date  . None      Current Outpatient Medications  Medication Sig Dispense Refill  . lansoprazole (PREVACID) 30 MG capsule Take 1 capsule by mouth 2 (two) times daily.     No current facility-administered medications for this visit.     Allergies as of 07/15/2019  . (No Known Allergies)    Family History  Problem Relation Age of Onset  . Colon cancer Maternal Grandfather   . Colon polyps Neg Hx     Social History   Socioeconomic History  . Marital status: Married    Spouse name: Not on file  . Number of children: Not on file  . Years of education: Not on file  . Highest education level: Not on file  Occupational History  . Not on file  Social Needs  . Financial resource strain: Not on file  . Food insecurity    Worry: Not on file    Inability: Not on file  . Transportation needs    Medical: Not on file    Non-medical: Not on file  Tobacco Use  . Smoking status: Former Smoker    Types: Cigarettes  . Smokeless tobacco: Never Used  Substance and Sexual Activity  . Alcohol use: Not Currently    Comment: occasional. 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   . Drug use: Not  Currently    Types: Marijuana    Comment: denied 07/15/19, stopped 3-4 months ago.   Marland Kitchen. Sexual activity: Not on file  Lifestyle  . Physical activity    Days per week: Not on file    Minutes per session: Not on file  . Stress: Not on file  Relationships  . Social Musicianconnections    Talks on phone: Not on file    Gets together: Not on file    Attends religious service: Not on file    Active member of club or organization: Not on file    Attends meetings of clubs or organizations: Not on file    Relationship status: Not on file  . Intimate partner violence    Fear of current or ex partner: Not on file    Emotionally abused: Not on file    Physically abused: Not  on file    Forced sexual activity: Not on file  Other Topics Concern  . Not on file  Social History Narrative  . Not on file    Review of Systems: Gen: Denies any fever, chills, fatigue, weight loss, lack of appetite.  CV: Denies chest pain, heart palpitations, peripheral edema, syncope.  Resp: Denies shortness of breath at rest or with exertion. Denies wheezing or cough.  GI: see HPI  GU : Denies urinary burning, urinary frequency, urinary hesitancy MS: Denies joint pain, muscle weakness, cramps, or limitation of movement.  Derm: Denies rash, itching, dry skin Psych: Denies depression, anxiety, memory loss, and confusion Heme: Denies bruising, bleeding, and enlarged lymph nodes.  Physical Exam: BP 126/77   Pulse 74   Temp (!) 97.3 F (36.3 C) (Temporal)   Ht 5\' 10"  (1.778 m)   Wt 240 lb 6.4 oz (109 kg)   BMI 34.49 kg/m  General:   Alert and oriented. Pleasant and cooperative. Well-nourished and well-developed.  Head:  Normocephalic and atraumatic. Eyes:  Without icterus, sclera clear and conjunctiva pink.  Ears:  Normal auditory acuity. Lungs:  Clear to auscultation bilaterally. No wheezes, rales, or rhonchi. No distress.  Heart:  S1, S2 present without murmurs appreciated.  Abdomen:  +BS, soft, mild TTP LUQ and periumbilically. Non-distended. No HSM noted. No guarding or rebound. No masses appreciated.  Rectal:  Deferred  Msk:  Symmetrical without gross deformities. Normal posture. Extremities:  Without edema. Neurologic:  Alert and  oriented x4 Psych:  Alert and cooperative. Normal mood and affect.

## 2019-07-15 NOTE — Patient Instructions (Signed)
Continue Prevacid twice a day. Make sure you are taking it 30 minutes before breakfast and before dinner. It is best absorbed on an empty stomach.  We have arranged an upper endoscopy with dilation by Dr. Oneida Alar in the near future.  Call if you have watery/loose stool more than 3 times per day.   We will see you after the procedures!  It was a pleasure to see you today. I strive to create trusting relationships with patients to provide genuine, compassionate, and quality care. I value your feedback. If you receive a survey regarding your visit,  I greatly appreciate you taking time to fill this out.   Annitta Needs, PhD, ANP-BC [facility]

## 2019-07-16 ENCOUNTER — Other Ambulatory Visit: Payer: Self-pay

## 2019-07-16 ENCOUNTER — Telehealth: Payer: Self-pay

## 2019-07-16 DIAGNOSIS — R1013 Epigastric pain: Secondary | ICD-10-CM

## 2019-07-16 DIAGNOSIS — R1310 Dysphagia, unspecified: Secondary | ICD-10-CM

## 2019-07-16 NOTE — Telephone Encounter (Signed)
Called pt, EGD/DIL w/Propofol w/SLF scheduled for 10/03/19 at 8:30am. Orders entered.

## 2019-07-17 NOTE — Telephone Encounter (Signed)
Pre-op appt 10/01/19 at 10:00am. Letter mailed with procedure instructions.

## 2019-08-19 ENCOUNTER — Other Ambulatory Visit: Payer: Self-pay

## 2019-08-19 ENCOUNTER — Ambulatory Visit
Admission: EM | Admit: 2019-08-19 | Discharge: 2019-08-19 | Disposition: A | Discharge status: Home / Self Care | Payer: Medicaid Other | Attending: Emergency Medicine | Admitting: Emergency Medicine

## 2019-08-19 ENCOUNTER — Ambulatory Visit (INDEPENDENT_AMBULATORY_CARE_PROVIDER_SITE_OTHER): Payer: Medicaid Other

## 2019-08-19 DIAGNOSIS — M545 Low back pain, unspecified: Secondary | ICD-10-CM

## 2019-08-19 DIAGNOSIS — X500XXA Overexertion from strenuous movement or load, initial encounter: Secondary | ICD-10-CM | POA: Diagnosis not present

## 2019-08-19 DIAGNOSIS — M62838 Other muscle spasm: Secondary | ICD-10-CM | POA: Diagnosis not present

## 2019-08-19 DIAGNOSIS — S3992XA Unspecified injury of lower back, initial encounter: Secondary | ICD-10-CM | POA: Diagnosis not present

## 2019-08-19 MED ORDER — METHYLPREDNISOLONE SODIUM SUCC 125 MG IJ SOLR
125.0000 mg | Freq: Once | INTRAMUSCULAR | Status: AC
  Administered 2019-08-19: 18:00:00 125 mg via INTRAMUSCULAR

## 2019-08-19 MED ORDER — PREDNISONE 20 MG PO TABS: 20.0000 mg | ORAL_TABLET | Freq: Two times a day (BID) | ORAL | 0 refills | Status: AC

## 2019-08-19 MED ORDER — CYCLOBENZAPRINE HCL 10 MG PO TABS: 10.0000 mg | ORAL_TABLET | Freq: Every day | ORAL | 0 refills | Status: DC

## 2019-08-19 NOTE — ED Triage Notes (Signed)
Pt was moving cabinet earlier today when he was no longer able to straighten, pt states pain is in lower back and radiates to legs

## 2019-08-19 NOTE — Discharge Instructions (Signed)
X-rays did not show fracture or dislocation Steroid shot given in office Continue conservative management of rest, ice, heat and gentle stretches/ massage Prednisone prescribed.  Take as directed and to completion Cyclobenzaprine prescribed.  Take as directed.  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   Follow up with PCP if symptoms persist Return or go to the ER if you have any new or worsening symptoms (fever, chills, chest pain, abdominal pain, changes in bowel or bladder habits, pain radiating into lower legs, numbness/ tingling in legs, etc...)

## 2019-08-19 NOTE — ED Provider Notes (Signed)
[facility]   [id number] 08/19/19 Arrival Time: 1628  CC: Low back pain  SUBJECTIVE: History from: patient. Doris CheadleKenneth W Nesheim is a 28 y.o. male complains of low back pain that began today.  Symptoms began after moving a cabinet earlier today.  Localizes the pain to the bilateral low back.  Describes the pain as constant and sharp in character.  9/10.  Has tried OTC medications with minimal relief.  Symptoms are made worse with straightening back and walking.  Reports similar symptoms in the past related to strained lumbar, but this pain is more severe.  Complains of weakness in LEs.  Denies fever, chills, erythema, ecchymosis, effusion, numbness and tingling, saddle paresthesias, loss of bowel or bladder function.      ROS: As per HPI.  All other pertinent ROS negative.     Past Medical History:  Diagnosis Date  . Asthma   . Depression   . GERD (gastroesophageal reflux disease)   . Renal disorder    kidney stone   Past Surgical History:  Procedure Laterality Date  . None     No Known Allergies No current facility-administered medications on file prior to encounter.    Current Outpatient Medications on File Prior to Encounter  Medication Sig Dispense Refill  . lansoprazole (PREVACID) 30 MG capsule Take 1 capsule by mouth 2 (two) times daily.     Social History   Socioeconomic History  . Marital status: Married    Spouse name: Not on file  . Number of children: Not on file  . Years of education: Not on file  . Highest education level: Not on file  Occupational History  . Not on file  Social Needs  . Financial resource strain: Not on file  . Food insecurity    Worry: Not on file    Inability: Not on file  . Transportation needs    Medical: Not on file    Non-medical: Not on file  Tobacco Use  . Smoking status: Former Smoker    Types: Cigarettes  . Smokeless tobacco: Never Used  Substance and Sexual Activity  . Alcohol use: Not Currently    Comment:  occasional. 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   . Drug use: Not Currently    Types: Marijuana    Comment: denied 07/15/19, stopped 3-4 months ago.   Marland Kitchen. Sexual activity: Not on file  Lifestyle  . Physical activity    Days per week: Not on file    Minutes per session: Not on file  . Stress: Not on file  Relationships  . Social Musicianconnections    Talks on phone: Not on file    Gets together: Not on file    Attends religious service: Not on file    Active member of club or organization: Not on file    Attends meetings of clubs or organizations: Not on file    Relationship status: Not on file  . Intimate partner violence    Fear of current or ex partner: Not on file    Emotionally abused: Not on file    Physically abused: Not on file    Forced sexual activity: Not on file  Other Topics Concern  . Not on file  Social History Narrative  . Not on file   Family History  Problem Relation Age of Onset  . Colon cancer Maternal Grandfather   . Colon polyps Neg Hx     OBJECTIVE:  Vitals:  08/19/19 1641  BP: 106/69  Pulse: 68  Resp: 20  Temp: 98.5 F (36.9 C)  SpO2: 96%    General appearance: ALERT; in no acute distress.  Head: NCAT Lungs: Normal respiratory effort; CTAB CV: RRR Musculoskeletal: Low back  Inspection: Skin warm, dry, clear and intact without obvious erythema, effusion, or ecchymosis.  Palpation: No midline tenderness; TTP over bilateral lumbar paravertebral muscles ROM: FROM active and passive Strength: 5/5 shld abduction, 5/5 shld adduction, 5/5 elbow flexion, 5/5 elbow extension, 5/5 grip strength, 3+/5 hip flexion, 4+/5 knee abduction, 5/5 knee adduction, 5/5 knee flexion, 5/5 knee extension, 5/5 dorsiflexion, 5/5 plantar flexion Skin: warm and dry Neurologic: Sitting in wheelchair; Sensation intact about the upper/ lower extremities Psychological: alert and cooperative; normal mood and affect  DIAGNOSTIC STUDIES:  Dg Lumbar Spine  Complete  Result Date: 08/19/2019 CLINICAL DATA:  Back injury lifting heavy cabinet today. Low back pain. Initial encounter. EXAM: LUMBAR SPINE - COMPLETE 4+ VIEW COMPARISON:  11/18/2017 FINDINGS: There is no evidence of lumbar spine fracture. Alignment is normal. Intervertebral disc spaces are maintained. IMPRESSION: Negative. Electronically Signed   By: Marlaine Hind M.D.   On: 08/19/2019 17:24     X-rays negative for bony abnormalities including fracture, or dislocation.    I have reviewed the x-rays myself and the radiologist interpretation. I am in agreement with the radiologist interpretation.    ASSESSMENT & PLAN:  1. Acute bilateral low back pain without sciatica   2. Muscle spasm     Meds ordered this encounter  Medications  . predniSONE (DELTASONE) 20 MG tablet    Sig: Take 1 tablet (20 mg total) by mouth 2 (two) times daily with a meal for 5 days.    Dispense:  10 tablet    Refill:  0    Order Specific Question:   Supervising Provider    Answer:   Raylene Everts [[id number]]  . cyclobenzaprine (FLEXERIL) 10 MG tablet    Sig: Take 1 tablet (10 mg total) by mouth at bedtime.    Dispense:  15 tablet    Refill:  0    Order Specific Question:   Supervising Provider    Answer:   Raylene Everts [[id number]]  . methylPREDNISolone sodium succinate (SOLU-MEDROL) 125 mg/2 mL injection 125 mg   X-rays did not show fracture or dislocation Steroid shot given in office Continue conservative management of rest, ice, heat and gentle stretches/ massage Prednisone prescribed.  Take as directed and to completion Cyclobenzaprine prescribed.  Take as directed.  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   Follow up with PCP if symptoms persist Return or go to the ER if you have any new or worsening symptoms (fever, chills, chest pain, abdominal pain, changes in bowel or bladder habits, pain radiating into lower legs, numbness/ tingling  in legs, etc...)   Reviewed expectations re: course of current medical issues. Questions answered. Outlined signs and symptoms indicating need for more acute intervention. Patient verbalized understanding. After Visit Summary given.    Lestine Box, PA-C 08/19/19 1750

## 2019-09-30 ENCOUNTER — Encounter (HOSPITAL_COMMUNITY): Payer: Self-pay

## 2019-09-30 ENCOUNTER — Other Ambulatory Visit: Payer: Self-pay

## 2019-10-01 ENCOUNTER — Encounter (HOSPITAL_COMMUNITY)
Admission: RE | Admit: 2019-10-01 | Discharge: 2019-10-01 | Disposition: A | Discharge status: Home / Self Care | Payer: Medicaid Other | Source: Ambulatory Visit | Attending: Gastroenterology | Admitting: Gastroenterology

## 2019-10-01 ENCOUNTER — Other Ambulatory Visit (HOSPITAL_COMMUNITY)
Admission: RE | Admit: 2019-10-01 | Discharge: 2019-10-01 | Disposition: A | Discharge status: Home / Self Care | Payer: Medicaid Other | Source: Ambulatory Visit | Attending: Gastroenterology | Admitting: Gastroenterology

## 2019-10-01 DIAGNOSIS — Z20828 Contact with and (suspected) exposure to other viral communicable diseases: Secondary | ICD-10-CM | POA: Diagnosis not present

## 2019-10-01 DIAGNOSIS — Z01812 Encounter for preprocedural laboratory examination: Secondary | ICD-10-CM | POA: Diagnosis not present

## 2019-10-01 LAB — SARS CORONAVIRUS 2 (TAT 6-24 HRS): SARS Coronavirus 2: NEGATIVE

## 2019-10-03 ENCOUNTER — Encounter (HOSPITAL_COMMUNITY)
Admission: RE | Disposition: A | Discharge status: Home / Self Care | Payer: Self-pay | Source: Home / Self Care | Attending: Gastroenterology

## 2019-10-03 ENCOUNTER — Ambulatory Visit (HOSPITAL_COMMUNITY)
Admission: RE | Admit: 2019-10-03 | Discharge: 2019-10-03 | Disposition: A | Discharge status: Home / Self Care | Payer: Medicaid Other | Attending: Gastroenterology | Admitting: Gastroenterology

## 2019-10-03 ENCOUNTER — Other Ambulatory Visit: Payer: Self-pay

## 2019-10-03 ENCOUNTER — Ambulatory Visit (HOSPITAL_COMMUNITY): Payer: Medicaid Other | Admitting: Anesthesiology

## 2019-10-03 ENCOUNTER — Encounter (HOSPITAL_COMMUNITY): Payer: Self-pay | Admitting: Emergency Medicine

## 2019-10-03 DIAGNOSIS — Z79899 Other long term (current) drug therapy: Secondary | ICD-10-CM | POA: Insufficient documentation

## 2019-10-03 DIAGNOSIS — Z87891 Personal history of nicotine dependence: Secondary | ICD-10-CM | POA: Diagnosis not present

## 2019-10-03 DIAGNOSIS — Z800 Family history of malignant neoplasm of digestive organs: Secondary | ICD-10-CM | POA: Insufficient documentation

## 2019-10-03 DIAGNOSIS — F329 Major depressive disorder, single episode, unspecified: Secondary | ICD-10-CM | POA: Insufficient documentation

## 2019-10-03 DIAGNOSIS — F419 Anxiety disorder, unspecified: Secondary | ICD-10-CM | POA: Insufficient documentation

## 2019-10-03 DIAGNOSIS — R1310 Dysphagia, unspecified: Secondary | ICD-10-CM | POA: Insufficient documentation

## 2019-10-03 DIAGNOSIS — K2970 Gastritis, unspecified, without bleeding: Secondary | ICD-10-CM | POA: Diagnosis not present

## 2019-10-03 DIAGNOSIS — J45909 Unspecified asthma, uncomplicated: Secondary | ICD-10-CM | POA: Insufficient documentation

## 2019-10-03 DIAGNOSIS — K200 Eosinophilic esophagitis: Secondary | ICD-10-CM | POA: Diagnosis not present

## 2019-10-03 DIAGNOSIS — K2980 Duodenitis without bleeding: Secondary | ICD-10-CM | POA: Diagnosis not present

## 2019-10-03 DIAGNOSIS — R1013 Epigastric pain: Secondary | ICD-10-CM

## 2019-10-03 DIAGNOSIS — K2100 Gastro-esophageal reflux disease with esophagitis, without bleeding: Secondary | ICD-10-CM

## 2019-10-03 DIAGNOSIS — Z87442 Personal history of urinary calculi: Secondary | ICD-10-CM | POA: Insufficient documentation

## 2019-10-03 HISTORY — PX: ESOPHAGOGASTRODUODENOSCOPY (EGD) WITH PROPOFOL: SHX5813

## 2019-10-03 HISTORY — PX: SAVORY DILATION: SHX5439

## 2019-10-03 SURGERY — ESOPHAGOGASTRODUODENOSCOPY (EGD) WITH PROPOFOL
Anesthesia: General

## 2019-10-03 MED ORDER — LANSOPRAZOLE 30 MG PO CPDR: DELAYED_RELEASE_CAPSULE | ORAL | 5 refills | Status: DC

## 2019-10-03 MED ORDER — PROPOFOL 10 MG/ML IV BOLUS
INTRAVENOUS | Status: DC | PRN
  Administered 2019-10-03: 60 mg via INTRAVENOUS

## 2019-10-03 MED ORDER — LIDOCAINE VISCOUS HCL 2 % MT SOLN
OROMUCOSAL | Status: AC
  Filled 2019-10-03: qty 15

## 2019-10-03 MED ORDER — CHLORHEXIDINE GLUCONATE CLOTH 2 % EX PADS: 6.0000 | MEDICATED_PAD | Freq: Once | CUTANEOUS | Status: DC

## 2019-10-03 MED ORDER — LIDOCAINE VISCOUS HCL 2 % MT SOLN
5.0000 mL | Freq: Once | OROMUCOSAL | Status: AC
  Administered 2019-10-03: 08:00:00 5 mL via OROMUCOSAL

## 2019-10-03 MED ORDER — LIDOCAINE VISCOUS HCL 2 % MT SOLN
OROMUCOSAL | Status: DC | PRN
  Administered 2019-10-03: 5 mL via OROMUCOSAL

## 2019-10-03 MED ORDER — LACTATED RINGERS IV SOLN
INTRAVENOUS | Status: DC | PRN
  Administered 2019-10-03: 08:00:00 via INTRAVENOUS

## 2019-10-03 MED ORDER — KETAMINE HCL 10 MG/ML IJ SOLN
INTRAMUSCULAR | Status: DC | PRN
  Administered 2019-10-03: 15 mg via INTRAVENOUS

## 2019-10-03 MED ORDER — PROPOFOL 500 MG/50ML IV EMUL
INTRAVENOUS | Status: DC | PRN
  Administered 2019-10-03: 200 ug/kg/min via INTRAVENOUS
  Administered 2019-10-03: 150 ug/kg/min via INTRAVENOUS

## 2019-10-03 MED ORDER — LACTATED RINGERS IV SOLN
Freq: Once | INTRAVENOUS | Status: AC
  Administered 2019-10-03: 08:00:00 via INTRAVENOUS

## 2019-10-03 MED ORDER — GLYCOPYRROLATE 0.2 MG/ML IJ SOLN
INTRAMUSCULAR | Status: DC | PRN
  Administered 2019-10-03: 0.2 mg via INTRAVENOUS

## 2019-10-03 MED ORDER — MINERAL OIL PO OIL
TOPICAL_OIL | ORAL | Status: AC
  Filled 2019-10-03: qty 30

## 2019-10-03 MED ORDER — GLYCOPYRROLATE PF 0.2 MG/ML IJ SOSY
PREFILLED_SYRINGE | INTRAMUSCULAR | Status: AC
  Filled 2019-10-03: qty 1

## 2019-10-03 MED ORDER — KETAMINE HCL 50 MG/5ML IJ SOSY
PREFILLED_SYRINGE | INTRAMUSCULAR | Status: AC
  Filled 2019-10-03: qty 5

## 2019-10-03 NOTE — Anesthesia Preprocedure Evaluation (Signed)
Anesthesia Evaluation  Patient identified by MRN, date of birth, ID band Patient awake    Reviewed: Allergy & Precautions, NPO status , Patient's Chart, lab work & pertinent test results  Airway Mallampati: III  TM Distance: >3 FB Neck ROM: Full    Dental  (+) Chipped   Pulmonary asthma , former smoker,    Pulmonary exam normal        Cardiovascular Exercise Tolerance: Good Normal cardiovascular exam     Neuro/Psych Anxiety Depression    GI/Hepatic Neg liver ROS, GERD  Medicated,  Endo/Other  negative endocrine ROS  Renal/GU Renal disease     Musculoskeletal   Abdominal   Peds  Hematology negative hematology ROS (+)   Anesthesia Other Findings   Reproductive/Obstetrics                             Anesthesia Physical Anesthesia Plan  ASA: II  Anesthesia Plan: General   Post-op Pain Management:    Induction: Intravenous  PONV Risk Score and Plan: TIVA  Airway Management Planned: Natural Airway and Nasal Cannula  Additional Equipment:   Intra-op Plan:   Post-operative Plan:   Informed Consent: I have reviewed the patients History and Physical, chart, labs and discussed the procedure including the risks, benefits and alternatives for the proposed anesthesia with the patient or authorized representative who has indicated his/her understanding and acceptance.     Dental advisory given  Plan Discussed with: CRNA  Anesthesia Plan Comments:         Anesthesia Quick Evaluation

## 2019-10-03 NOTE — Anesthesia Postprocedure Evaluation (Signed)
Anesthesia Post Note  Patient: Ernest Cochran  Procedure(s) Performed: ESOPHAGOGASTRODUODENOSCOPY (EGD) WITH PROPOFOL (N/A ) SAVORY DILATION (N/A )  Patient location during evaluation: PACU Anesthesia Type: General Level of consciousness: awake and alert and patient cooperative Pain management: satisfactory to patient Vital Signs Assessment: post-procedure vital signs reviewed and stable Respiratory status: spontaneous breathing Cardiovascular status: stable Postop Assessment: no apparent nausea or vomiting Anesthetic complications: no     Last Vitals:  Vitals:   10/03/19 0854 10/03/19 0900  BP:  112/68  Pulse:  62  Resp:  12  Temp: 36.6 C   SpO2:  100%    Last Pain:  Vitals:   10/03/19 0900  TempSrc:   PainSc: 0-No pain                 Arnelle Nale

## 2019-10-03 NOTE — Discharge Instructions (Signed)
I dilated your esophagus. I DID NOT SEE A DEFINITE NARROWING IN YOUR esophagus. You have MILD gastritis. I biopsied your ESOPHAGUS AND stomach.  EAT TO LIVE AND THINK OF FOOD AS MEDICINE. 75% OF YOUR PLATE SHOULD BE FRUITS/VEGGIES. DO NOT EAT FAST FOOD.  To have more energy, and to lose weight:      1. CONTINUE YOUR WEIGHT LOSS EFFORTS. I RECOMMEND YOU READ AND FOLLOW RECOMMENDATIONS BY DR. MARK HYMAN, "10-DAY DETOX DIET".    2. If you must eat bread, EAT EZEKIEL BREAD. IT IS IN THE FROZEN SECTION OF THE GROCERY STORE.    3. DRINK WATER WITH FRUIT OR CUCUMBER ADDED. YOUR URINE SHOULD BE LIGHT YELLOW. AVOID SODA, GATORADE, ENERGY DRINKS, OR DIET SODA.     4. AVOID HIGH FRUCTOSE CORN SYRUP AND CAFFEINE.     5. DO NOT chew SUGAR FREE GUM OR USE ARTIFICIAL SWEETENERS. IF NEEDED USE STEVIA AS A SWEETENER.    6. DO NOT EAT ENRICHED WHEAT FLOUR, PASTA, RICE, OR CEREAL.    7. ONLY EAT WILD CAUGHT SEAFOOD, GRASS FED BEEF OR CHICKEN, PORK FROM PASTURE RAISE PIGS, OR EGGS FROM PASTURE RAISED CHICKENS.    8. START TAKING A MULTIVITAMIN, VITAMIN B12, AND VITAMIN D3 2000 IU DAILY.   ADDITIONAL SUPPLEMENTS TO DECREASE CRAVING AND SUPPRESS YOUR APPETITE:    1. CINNAMON 500 MG EVERY AM PRIOR TO FIRST MEAL.   **STABILIZES BLOOD GLUCOSE/REDUCES CRAVINGS**    2. CHROMIUM 400-500 MG WITH MEALS TWICE DAILY.    **FAT BURNER**    3. GREEN TEA EXTRACT ONE DAILY.   **FAT BURNER/SUPPRESSES YOUR APPETITE**    4. ALPHA LIPOIC ACID TWICE DAILY.   **NATURAL ANTI-INFLAMMATORY SUPPLEMENT THAT IS AN ALTERNATIVE TO IBUPROFEN OR NAPROXEN**   AVOID REFLUX TRIGGERS. SEE INFO BELOW.  CONTINUE PREVACID. TAKE 30 MINUTES PRIOR TO MEALS TWICE DAILY.   YOUR BIOPSY RESULTS WILL BE BACK IN 5-7 BUSINESS DAYS.  FOLLOW UP IN 4 MOS.  UPPER ENDOSCOPY AFTER CARE Read the instructions outlined below and refer to this sheet in the next week. These discharge instructions provide you with general information on caring for  yourself after you leave the hospital. While your treatment has been planned according to the most current medical practices available, unavoidable complications occasionally occur. If you have any problems or questions after discharge, call DR. FIELDS, [phone].  ACTIVITY  You may resume your regular activity, but move at a slower pace for the next 24 hours.   Take frequent rest periods for the next 24 hours.   Walking will help get rid of the air and reduce the bloated feeling in your belly (abdomen).   No driving for 24 hours (because of the medicine (anesthesia) used during the test).   You may shower.   Do not sign any important legal documents or operate any machinery for 24 hours (because of the anesthesia used during the test).    NUTRITION  Drink plenty of fluids.   You may resume your normal diet as instructed by your doctor.   Begin with a light meal and progress to your normal diet. Heavy or fried foods are harder to digest and may make you feel sick to your stomach (nauseated).   Avoid alcoholic beverages for 24 hours or as instructed.    MEDICATIONS  You may resume your normal medications.   WHAT YOU CAN EXPECT TODAY  Some feelings of bloating in the abdomen.   Passage of more gas than usual.  IF YOU HAD A BIOPSY TAKEN DURING THE UPPER ENDOSCOPY:  Eat a soft diet IF YOU HAVE NAUSEA, BLOATING, ABDOMINAL PAIN, OR VOMITING.    FINDING OUT THE RESULTS OF YOUR TEST Not all test results are available during your visit. DR. Oneida Alar WILL CALL YOU WITHIN 14 DAYS OF YOUR PROCEDUE WITH YOUR RESULTS. Do not assume everything is normal if you have not heard from DR. FIELDS, CALL HER OFFICE AT [phone].  SEEK IMMEDIATE MEDICAL ATTENTION AND CALL THE OFFICE: [phone] IF:  You have more than a spotting of blood in your stool.   Your belly is swollen (abdominal distention).   You are nauseated or vomiting.   You have a temperature over 101F.   You  have abdominal pain or discomfort that is severe or gets worse throughout the day.  DYSPHAGIA DYSPHAGIA can be caused by stomach acid backing up into the tube that carries food from the mouth down to the stomach (lower esophagus).  TREATMENT There are a number of medicines used to treat DYSPHAGIA including: Antacids.  Proton-pump inhibitors: PREVACID TAGAMET/PEPCID    Lifestyle and home remedies to control REFLUX and regurgitation  You may eliminate or reduce the frequency of heartburn by making the following lifestyle changes:   Control your weight. Being overweight is a major risk factor for heartburn and GERD. Excess pounds put pressure on your abdomen, pushing up your stomach and causing acid to back up into your esophagus.    Eat smaller meals. 4 TO 6 MEALS A DAY. This reduces pressure on the lower esophageal sphincter, helping to prevent the valve from opening and acid from washing back into your esophagus.    Loosen your belt. Clothes that fit tightly around your waist put pressure on your abdomen and the lower esophageal sphincter.     Eliminate heartburn triggers. Everyone has specific triggers. Common triggers such as fatty or fried foods, spicy food, tomato sauce, carbonated beverages, alcohol, chocolate, mint, garlic, onion, caffeine and nicotine may make heartburn worse.    Avoid stooping or bending. Tying your shoes is OK. Bending over for longer periods to weed your garden isn't, especially soon after eating.    Don't lie down after a meal. Wait at least three to four hours after eating before going to bed, and don't lie down right after eating.    SLEEP WITH ON A WEDGE OR PUT THE HEAD OF YOUR BED ON 6 INCH BLOCKS TO KEEP YOUR HEAD ABOVE YOUR HEART WHILE YOU SLEEP.  Alternative medicine  Several home remedies exist for treating GERD, but they provide only temporary relief. They include drinking baking soda (sodium bicarbonate) added to water or drinking other  fluids such as baking soda mixed with cream of tartar and water.  Although these liquids create temporary relief by neutralizing, washing away or buffering acids, eventually they aggravate the situation by adding gas and fluid to your stomach, increasing pressure and causing more acid reflux. Further, adding more sodium to your diet may increase your blood pressure and add stress to your heart, and excessive bicarbonate ingestion can alter the acid-base balance in your body.   Monitored Anesthesia Care, Care After These instructions provide you with information about caring for yourself after your procedure. Your health care provider may also give you more specific instructions. Your treatment has been planned according to current medical practices, but problems sometimes occur. Call your health care provider if you have any problems or questions after your procedure. What can I expect after  the procedure? After your procedure, you may:  Feel sleepy for several hours.  Feel clumsy and have poor balance for several hours.  Feel forgetful about what happened after the procedure.  Have poor judgment for several hours.  Feel nauseous or vomit.  Have a sore throat if you had a breathing tube during the procedure. Follow these instructions at home: For at least 24 hours after the procedure:      Have a responsible adult stay with you. It is important to have someone help care for you until you are awake and alert.  Rest as needed.  Do not: ? Participate in activities in which you could fall or become injured. ? Drive. ? Use heavy machinery. ? Drink alcohol. ? Take sleeping pills or medicines that cause drowsiness. ? Make important decisions or sign legal documents. ? Take care of children on your own. Eating and drinking  Follow the diet that is recommended by your health care provider.  If you vomit, drink water, juice, or soup when you can drink without vomiting.  Make sure  you have little or no nausea before eating solid foods. General instructions  Take over-the-counter and prescription medicines only as told by your health care provider.  If you have sleep apnea, surgery and certain medicines can increase your risk for breathing problems. Follow instructions from your health care provider about wearing your sleep device: ? Anytime you are sleeping, including during daytime naps. ? While taking prescription pain medicines, sleeping medicines, or medicines that make you drowsy.  If you smoke, do not smoke without supervision.  Keep all follow-up visits as told by your health care provider. This is important. Contact a health care provider if:  You keep feeling nauseous or you keep vomiting.  You feel light-headed.  You develop a rash.  You have a fever. Get help right away if:  You have trouble breathing. Summary  For several hours after your procedure, you may feel sleepy and have poor judgment.  Have a responsible adult stay with you for at least 24 hours or until you are awake and alert. This information is not intended to replace advice given to you by your health care provider. Make sure you discuss any questions you have with your health care provider. Document Released: 03/13/2016 Document Revised: 02/19/2018 Document Reviewed: 03/13/2016 Elsevier Patient Education  2020 [employer].

## 2019-10-03 NOTE — Op Note (Signed)
[facility] Patient Name: Ernest Cochran Procedure Date: 10/03/2019 8:11 AM MRN: [id number] Date of Birth: [birth year] Attending MD: Barney Drain MD, MD CSN: [id number] Age: 28 Admit Type: Outpatient Procedure:                Upper GI endoscopy WITH ESOPHAGEAL DILATION/COLD                            FORCEPS BIOPSY Indications:              Dyspepsia, Dysphagia-H PYLORI SEROLOGY POS TREATED                            WITH PYLERA. NOT TAKING PREVACID. NEEDS A Rx. Providers:                Barney Drain MD, MD, Charlsie Quest. Theda Sers RN, RN,                            Randa Spike, Technician Referring MD:             Leisa Lenz MD, MD Medicines:                Propofol per Anesthesia Complications:            No immediate complications. Estimated Blood Loss:     Estimated blood loss was minimal Procedure:                Pre-Anesthesia Assessment:                           - Prior to the procedure, a History and Physical                            was performed, and patient medications and                            allergies were reviewed. The patient's tolerance of                            previous anesthesia was also reviewed. The risks                            and benefits of the procedure and the sedation                            options and risks were discussed with the patient.                            All questions were answered, and informed consent                            was obtained. Prior Anticoagulants: The patient has                            taken no previous anticoagulant or antiplatelet  agents except for NSAID medication. ASA Grade                            Assessment: II - A patient with mild systemic                            disease. After reviewing the risks and benefits,                            the patient was deemed in satisfactory condition to                            undergo the procedure. After obtaining  informed                            consent, the endoscope was passed under direct                            vision. Throughout the procedure, the patient's                            blood pressure, pulse, and oxygen saturations were                            monitored continuously. The GIF-H190 ([id number]) was                            introduced through the mouth, and advanced to the                            second part of duodenum. The upper GI endoscopy was                            somewhat difficult due to the patient's agitation.                            Successful completion of the procedure was aided by                            increasing the dose of sedation medication. The                            patient tolerated the procedure fairly well. Scope In: 8:35:23 AM Scope Out: 8:45:27 AM Total Procedure Duration: 0 hours 10 minutes 4 seconds  Findings:      LA Grade B (one or more mucosal breaks greater than 5 mm, not extending       between the tops of two mucosal folds) esophagitis with no bleeding was       found. Biopsies were obtained from the proximal(20 CM FROM THE INCISORS)       and distal(35 CM FROM THE INCISORS) esophagus with cold forceps for       histology of suspected eosinophilic esophagitis. EGJ 40 CM FROM THE       INCISORS. A  guidewire was placed and the scope was withdrawn. Dilation       was performed with a Savary dilator with mild resistance at 15 mm, 16 mm       and 17 mm.      Localized mild inflammation characterized by congestion (edema) and       erythema was found on the greater curvature of the stomach and in the       gastric antrum. Biopsies(2;BODY,1:INCISURA,1:ANTRUM) were taken with a       cold forceps for Helicobacter pylori testing.      Localized mild inflammation characterized by congestion (edema) and       erythema was found in the duodenal bulb.      The second portion of the duodenum was normal. Impression:               -  DYSPHAGIA MOST LIKELY DUE TO Reflux esophagitis.                            Biopsied. Dilated.                           - MILD Gastritis AND Duodenitis. Moderate Sedation:      Per Anesthesia Care Recommendation:           - Patient has a contact number available for                            emergencies. The signs and symptoms of potential                            delayed complications were discussed with the                            patient. Return to normal activities tomorrow.                            Written discharge instructions were provided to the                            patient.                           - Low fat diet. LSOEW EIGHT TO BMI < 30.                           - Continue present medications. PREVACID BID.                           - Await pathology results.                           - Return to GI office in 4 months. Procedure Code(s):        --- Professional ---                           [phone], Esophagogastroduodenoscopy, flexible,  transoral; with insertion of guide wire followed by                            passage of dilator(s) through esophagus over guide                            wire                           43239, 59, Esophagogastroduodenoscopy, flexible,                            transoral; with biopsy, single or multiple Diagnosis Code(s):        --- Professional ---                           K21.0, Gastro-esophageal reflux disease with                            esophagitis                           K29.70, Gastritis, unspecified, without bleeding                           K29.80, Duodenitis without bleeding                           R10.13, Epigastric pain                           R13.10, Dysphagia, unspecified CPT copyright 2019 American Medical Association. All rights reserved. The codes documented in this report are preliminary and upon coder review may  be revised to meet current compliance  requirements. Jonette EvaSandi Van Ehlert, MD Jonette EvaSandi Bruna Dills MD, MD 10/03/2019 10:09:57 AM This report has been signed electronically. Number of Addenda: 0

## 2019-10-03 NOTE — Anesthesia Procedure Notes (Signed)
Date/Time: 10/03/2019 8:28 AM Performed by: Vista Deck, CRNA Pre-anesthesia Checklist: Patient identified, Emergency Drugs available, Suction available, Timeout performed and Patient being monitored Patient Re-evaluated:Patient Re-evaluated prior to induction Oxygen Delivery Method: Nasal Cannula

## 2019-10-03 NOTE — H&P (Signed)
Primary Care Physician:  Laverle Hobby, MD Primary Gastroenterologist:  Dr. Darrick Penna  Pre-Procedure History & Physical: HPI:  Ernest Cochran is a 28 y.o. male here for [facility].  Past Medical History:  Diagnosis Date  . Asthma   . Depression   . GERD (gastroesophageal reflux disease)   . Renal disorder    kidney stone    Past Surgical History:  Procedure Laterality Date  . None      Prior to Admission medications   Medication Sig Start Date End Date Taking? Authorizing Provider  acetaminophen (TYLENOL) 500 MG tablet Take 1,000 mg by mouth every 6 (six) hours as needed for moderate pain.   Yes [provider]  bismuth subsalicylate (PEPTO BISMOL) 262 MG chewable tablet Chew 524 mg by mouth as needed for indigestion.   Yes [provider]  naproxen sodium (ALEVE) 220 MG tablet Take 440 mg by mouth daily as needed (pain).   Yes [provider]  cyclobenzaprine (FLEXERIL) 10 MG tablet Take 1 tablet (10 mg total) by mouth at bedtime. Patient not taking: Reported on 09/26/2019 08/19/19   Alvino Chapel [place], PA-C    Allergies as of 07/16/2019  . (No Known Allergies)    Family History  Problem Relation Age of Onset  . Colon cancer Maternal Grandfather   . Colon polyps Neg Hx     Social History   Socioeconomic History  . Marital status: Married    Spouse name: Not on file  . Number of children: Not on file  . Years of education: Not on file  . Highest education level: Not on file  Occupational History  . Not on file  Social Needs  . Financial resource strain: Not on file  . Food insecurity    Worry: Not on file    Inability: Not on file  . Transportation needs    Medical: Not on file    Non-medical: Not on file  Tobacco Use  . Smoking status: Former Smoker    Types: Cigarettes  . Smokeless tobacco: Never Used  Substance and Sexual Activity  . Alcohol use: Not Currently    Comment: occasional. history of heavy drinking in past,  now 6 beers a week.   . Drug use: Not Currently    Types: Marijuana    Comment: denied 07/15/19, stopped 3-4 months ago.   Marland Kitchen Sexual activity: Not on file  Lifestyle  . Physical activity    Days per week: Not on file    Minutes per session: Not on file  . Stress: Not on file  Relationships  . Social Musician on phone: Not on file    Gets together: Not on file    Attends religious service: Not on file    Active member of club or organization: Not on file    Attends meetings of clubs or organizations: Not on file    Relationship status: Not on file  . Intimate partner violence    Fear of current or ex partner: Not on file    Emotionally abused: Not on file    Physically abused: Not on file    Forced sexual activity: Not on file  Other Topics Concern  . Not on file  Social History Narrative  . Not on file    Review of Systems: See HPI, otherwise negative ROS   Physical Exam: BP 127/71   Pulse 72   Temp 98.2 F (36.8 C) (Oral)   Resp 19   SpO2  95%  General:   Alert,  pleasant and cooperative in NAD Head:  Normocephalic and atraumatic. Neck:  Supple; Lungs:  Clear throughout to auscultation.    Heart:  Regular rate and rhythm. Abdomen:  Soft, nontender and nondistended. Normal bowel sounds, without guarding, and without rebound.   Neurologic:  Alert and  oriented x4;  grossly normal neurologically.  Impression/Plan:    DYSPHAGIA/dyspepsia  PLAN:  EGD/DIL TODAY. DISCUSSED PROCEDURE, BENEFITS, & RISKS: < 1% chance of medication reaction, bleeding, ASPIRATION, OR perforation.

## 2019-10-03 NOTE — Transfer of Care (Signed)
Immediate Anesthesia Transfer of Care Note  Patient: Ernest Cochran  Procedure(s) Performed: ESOPHAGOGASTRODUODENOSCOPY (EGD) WITH PROPOFOL (N/A ) SAVORY DILATION (N/A )  Patient Location: PACU  Anesthesia Type:General  Level of Consciousness: awake and patient cooperative  Airway & Oxygen Therapy: Patient Spontanous Breathing  Post-op Assessment: Report given to RN and Post -op Vital signs reviewed and stable  Post vital signs: Reviewed and stable  Last Vitals:  Vitals Value Taken Time  BP    Temp    Pulse 78 10/03/19 0855  Resp 32 10/03/19 0855  SpO2 95 % 10/03/19 0855  Vitals shown include unvalidated device data.  Last Pain:  Vitals:   10/03/19 0717  TempSrc: Oral  PainSc: 0-No pain         Complications: No apparent anesthesia complicationsSEE PACU FLOW SHEET FOR VITAL SIGNS

## 2019-10-04 LAB — SURGICAL PATHOLOGY

## 2019-10-07 ENCOUNTER — Encounter: Payer: Self-pay | Admitting: *Deleted

## 2019-10-07 ENCOUNTER — Other Ambulatory Visit: Payer: Self-pay | Admitting: *Deleted

## 2019-10-07 DIAGNOSIS — K200 Eosinophilic esophagitis: Secondary | ICD-10-CM

## 2019-10-07 NOTE — Progress Notes (Signed)
PATIENT SCHEDULED AND ON RECALL  °

## 2019-10-22 ENCOUNTER — Encounter (HOSPITAL_COMMUNITY): Payer: Self-pay | Admitting: Gastroenterology

## 2019-12-18 ENCOUNTER — Encounter: Payer: Self-pay | Admitting: Family Medicine

## 2019-12-18 DIAGNOSIS — R809 Proteinuria, unspecified: Secondary | ICD-10-CM | POA: Diagnosis not present

## 2019-12-18 DIAGNOSIS — R319 Hematuria, unspecified: Secondary | ICD-10-CM | POA: Diagnosis not present

## 2019-12-23 ENCOUNTER — Telehealth: Payer: Self-pay

## 2019-12-23 NOTE — Telephone Encounter (Signed)
EGD scheduled for 01/07/20 has to be moved up to morning. Tried to call pt, no answer, LMOVM for return call.

## 2019-12-23 NOTE — Telephone Encounter (Signed)
Spoke to pt, EGD for 01/07/20 moved up to 7:30am. Advised him to be NPO after midnight before procedure.  Endo scheduler informed.

## 2019-12-24 ENCOUNTER — Ambulatory Visit: Payer: Medicaid Other | Admitting: Family Medicine

## 2019-12-24 ENCOUNTER — Encounter: Payer: Self-pay | Admitting: Family Medicine

## 2019-12-24 ENCOUNTER — Other Ambulatory Visit: Payer: Self-pay

## 2019-12-24 VITALS — BP 136/85 | HR 74 | Temp 99.3°F | Ht 70.0 in | Wt 244.0 lb

## 2019-12-24 DIAGNOSIS — F32A Depression, unspecified: Secondary | ICD-10-CM | POA: Insufficient documentation

## 2019-12-24 DIAGNOSIS — R1013 Epigastric pain: Secondary | ICD-10-CM

## 2019-12-24 DIAGNOSIS — R319 Hematuria, unspecified: Secondary | ICD-10-CM

## 2019-12-24 DIAGNOSIS — F329 Major depressive disorder, single episode, unspecified: Secondary | ICD-10-CM | POA: Insufficient documentation

## 2019-12-24 DIAGNOSIS — F332 Major depressive disorder, recurrent severe without psychotic features: Secondary | ICD-10-CM | POA: Diagnosis not present

## 2019-12-24 LAB — URINALYSIS, COMPLETE
Bilirubin, UA: NEGATIVE
Glucose, UA: NEGATIVE
Ketones, UA: NEGATIVE
Leukocytes,UA: NEGATIVE
Nitrite, UA: NEGATIVE
Protein,UA: NEGATIVE
Specific Gravity, UA: 1.015 (ref 1.005–1.030)
Urobilinogen, Ur: 0.2 mg/dL (ref 0.2–1.0)
pH, UA: 6 (ref 5.0–7.5)

## 2019-12-24 LAB — MICROSCOPIC EXAMINATION
Bacteria, UA: NONE SEEN
Epithelial Cells (non renal): NONE SEEN /hpf (ref 0–10)
Renal Epithel, UA: NONE SEEN /hpf
WBC, UA: NONE SEEN /hpf (ref 0–5)
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
## 2019-12-25 DIAGNOSIS — R319 Hematuria, unspecified: Secondary | ICD-10-CM | POA: Insufficient documentation

## 2019-12-30 ENCOUNTER — Other Ambulatory Visit: Payer: Self-pay

## 2019-12-30 ENCOUNTER — Encounter (HOSPITAL_COMMUNITY): Payer: Self-pay | Admitting: *Deleted

## 2019-12-30 ENCOUNTER — Emergency Department (HOSPITAL_COMMUNITY)
Admission: EM | Admit: 2019-12-30 | Discharge: 2019-12-30 | Disposition: A | Discharge status: Home / Self Care | Payer: Medicaid Other | Attending: Emergency Medicine | Admitting: Emergency Medicine

## 2019-12-30 ENCOUNTER — Emergency Department (HOSPITAL_COMMUNITY): Payer: Medicaid Other

## 2019-12-30 DIAGNOSIS — N201 Calculus of ureter: Secondary | ICD-10-CM

## 2019-12-30 DIAGNOSIS — Z87891 Personal history of nicotine dependence: Secondary | ICD-10-CM | POA: Diagnosis not present

## 2019-12-30 DIAGNOSIS — N200 Calculus of kidney: Secondary | ICD-10-CM | POA: Diagnosis not present

## 2019-12-30 DIAGNOSIS — J45909 Unspecified asthma, uncomplicated: Secondary | ICD-10-CM | POA: Insufficient documentation

## 2019-12-30 DIAGNOSIS — R300 Dysuria: Secondary | ICD-10-CM | POA: Diagnosis present

## 2019-12-30 LAB — CBC WITH DIFFERENTIAL/PLATELET
Abs Immature Granulocytes: 0.16 10*3/uL — ABNORMAL HIGH (ref 0.00–0.07)
Basophils Absolute: 0.2 10*3/uL — ABNORMAL HIGH (ref 0.0–0.1)
Basophils Relative: 2 %
Eosinophils Absolute: 0.7 10*3/uL — ABNORMAL HIGH (ref 0.0–0.5)
Eosinophils Relative: 9 %
HCT: 49.9 % (ref 39.0–52.0)
Hemoglobin: 16.5 g/dL (ref 13.0–17.0)
Immature Granulocytes: 2 %
Lymphocytes Relative: 33 %
Lymphs Abs: 2.7 10*3/uL (ref 0.7–4.0)
MCH: 29.2 pg (ref 26.0–34.0)
MCHC: 33.1 g/dL (ref 30.0–36.0)
MCV: 88.2 fL (ref 80.0–100.0)
Monocytes Absolute: 0.6 10*3/uL (ref 0.1–1.0)
Monocytes Relative: 7 %
Neutro Abs: 3.9 10*3/uL (ref 1.7–7.7)
Neutrophils Relative %: 47 %
Platelets: 280 10*3/uL (ref 150–400)
RBC: 5.66 MIL/uL (ref 4.22–5.81)
RDW: 11.5 % (ref 11.5–15.5)
WBC: 8.2 10*3/uL (ref 4.0–10.5)
nRBC: 0 % (ref 0.0–0.2)

## 2019-12-30 LAB — URINALYSIS, ROUTINE W REFLEX MICROSCOPIC
Bacteria, UA: NONE SEEN
Bilirubin Urine: NEGATIVE
Glucose, UA: NEGATIVE mg/dL
Ketones, ur: NEGATIVE mg/dL
Leukocytes,Ua: NEGATIVE
Nitrite: NEGATIVE
Protein, ur: NEGATIVE mg/dL
Specific Gravity, Urine: 1.016 (ref 1.005–1.030)
pH: 5 (ref 5.0–8.0)

## 2019-12-30 LAB — COMPREHENSIVE METABOLIC PANEL
ALT: 33 U/L (ref 0–44)
AST: 20 U/L (ref 15–41)
Albumin: 4.2 g/dL (ref 3.5–5.0)
Alkaline Phosphatase: 103 U/L (ref 38–126)
Anion gap: 10 (ref 5–15)
BUN: 12 mg/dL (ref 6–20)
CO2: 25 mmol/L (ref 22–32)
Calcium: 9.5 mg/dL (ref 8.9–10.3)
Chloride: 101 mmol/L (ref 98–111)
Creatinine, Ser: 0.79 mg/dL (ref 0.61–1.24)
GFR calc Af Amer: >60 mL/min (ref >60)
GFR calc non Af Amer: >60 mL/min (ref >60)
Glucose, Bld: 95 mg/dL (ref 70–99)
Potassium: 3.6 mmol/L (ref 3.5–5.1)
Sodium: 136 mmol/L (ref 135–145)
Total Bilirubin: 0.6 mg/dL (ref 0.3–1.2)
Total Protein: 7.8 g/dL (ref 6.5–8.1)

## 2019-12-30 LAB — LIPASE, BLOOD: Lipase: 25 U/L (ref 11–51)

## 2019-12-30 MED ORDER — ONDANSETRON 4 MG PO TBDP: 4.0000 mg | ORAL_TABLET | Freq: Three times a day (TID) | ORAL | 0 refills | Status: DC | PRN

## 2019-12-30 MED ORDER — OXYCODONE-ACETAMINOPHEN 5-325 MG PO TABS: 1.0000 | ORAL_TABLET | Freq: Four times a day (QID) | ORAL | 0 refills | Status: DC | PRN

## 2019-12-30 MED ORDER — TAMSULOSIN HCL 0.4 MG PO CAPS: 0.4000 mg | ORAL_CAPSULE | Freq: Every day | ORAL | 0 refills | Status: AC

## 2019-12-30 MED ORDER — IBUPROFEN 800 MG PO TABS: 800.0000 mg | ORAL_TABLET | Freq: Three times a day (TID) | ORAL | 0 refills | Status: DC | PRN

## 2019-12-30 NOTE — Discharge Instructions (Signed)
You were seen in the emergency department and found to have a kidney stone.  We are sending you home with multiple medications to assist with passing the stone:   -Flomax-this is a medication to help pass the stone, it allows urine to exit the body more freely.  Please take this once daily with a meal.  -Ibuprofen 800 mg-this is a medication that will help with pain as well as passing the stone.  Please take this every 8 hours.  Take this with food as it can cause stomach upset and at worst stomach bleeding.  Do not take other NSAIDs such as Motrin, Aleve, Advil, Mobic, or Naproxen with this medicine as they are similar and would propagate any potential side effects.   -Percocet-this is a narcotic/controlled substance medication that has potential addicting qualities.  We recommend that you take 1-2 tablets every 6 hours as needed for severe pain.  Do not drive or operate heavy machinery when taking this medicine as it can be sedating. Do not drink alcohol or take other sedating medications when taking this medicine for safety reasons.  Keep this out of reach of small children.  Please be aware this medicine has Tylenol in it (325 mg/tab) do not exceed the maximum dose of Tylenol in a day per over the counter recommendations should you decide to supplement with Tylenol over the counter.   -Zofran-this is an antinausea medication, you may take this every 8 hours as needed for nausea and vomiting, please allow the tablet to dissolve underneath of your tongue.   We have prescribed you new medication(s) today. Discuss the medications prescribed today with your pharmacist as they can have adverse effects and interactions with your other medicines including over the counter and prescribed medications. Seek medical evaluation if you start to experience new or abnormal symptoms after taking one of these medicines, seek care immediately if you start to experience difficulty breathing, feeling of your throat  closing, facial swelling, or rash as these could be indications of a more serious allergic reaction  Please follow-up with the urology group provided in your discharge instructions within 3 to 5 days.  Return to the ER for new or worsening symptoms including but not limited to worsening pain not controlled by these medicines, inability to keep fluids down, fever, or any other concerns that you may have.  

## 2019-12-30 NOTE — ED Provider Notes (Signed)
[facility] EMERGENCY DEPARTMENT Provider Note   CSN: [id number] Arrival date & time: 12/30/19  1130     History Chief Complaint  Patient presents with   Dysuria    Ernest Cochran is a 29 y.o. male past medical history of asthma, depression, GERD, kidney stone presents to emergency room today with chief complaint of dysuria x2 weeks.   Patient states he had a physical for a new job 2 weeks ago.  The UA had moderate hemoglobinuria.  He denies any associated pain at that time. He was advised to follow up with pcp which he did and had another UA also with moderate hemoglobinuria.  Again denying any associated pain.  He was advised to follow-up with urology.  He states he called and the soonest appointment he could schedule was in March.  He is now reporting 1 week of dysuria. He describes it as sharp scratching sensation when urinating. At first the pain was intermittent but over the last 3 days is happening every time while urinating. Today he has pain in his right flank radiating to right groin. He rates the pain 6/10 in severity. He describes pain as sharp, no modifying or radiating factors. He is currently taking AZOs without symptom relief.  He denies fever, chills, chest pain, abdominal pain, nausea, vomiting, testicular pain, penile or scrotal swelling, penile discharge. He is not concerned for STIs, admits to one sexual partner.    Past Medical History:  Diagnosis Date   Asthma    Depression    GERD (gastroesophageal reflux disease)    Kidney stone     Patient Active Problem List   Diagnosis Date Noted   Hematuria 12/25/2019   Depression    Dyspepsia 07/15/2019   Dysphagia 07/15/2019    Past Surgical History:  Procedure Laterality Date   ESOPHAGOGASTRODUODENOSCOPY (EGD) WITH PROPOFOL N/A 10/03/2019   Procedure: ESOPHAGOGASTRODUODENOSCOPY (EGD) WITH PROPOFOL;  Surgeon: West Bali, MD;  [facility];  Service: Endoscopy;  Laterality: N/A;  8:30am     SAVORY DILATION N/A 10/03/2019   Procedure: SAVORY DILATION;  Surgeon: West Bali, MD;  Location: [facility];  Service: Endoscopy;  Laterality: N/A;  15/16/17       Family History  Problem Relation Age of Onset   Colon cancer Maternal Grandfather    Diabetes Maternal Grandfather    Heart disease Maternal Grandfather    Lung cancer Maternal Grandmother    Asthma Son    Colon polyps Neg Hx     Social History   Tobacco Use   Smoking status: Former Smoker    Types: Cigarettes   Smokeless tobacco: Never Used  Substance Use Topics   Alcohol use: Not Currently    Comment: occasional. 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    Drug use: Not Currently    Types: Marijuana    Comment: denied 07/15/19, stopped 3-4 months ago.     Home Medications Prior to Admission medications   Medication Sig Start Date End Date Taking? Authorizing Provider  ibuprofen (ADVIL) 800 MG tablet Take 1 tablet (800 mg total) by mouth 3 (three) times daily as needed. 12/30/19   Uzoma Vivona E, PA-C  lansoprazole (PREVACID) 30 MG capsule 1 po 30 mins prior to FIRST AND LAST MEAL Patient taking differently: Take 30 mg by mouth 2 (two) times daily at 8 am and 10 pm.  10/03/19   Fields, Darleene Cleaver, MD  ondansetron (ZOFRAN ODT) 4 MG  disintegrating tablet Take 1 tablet (4 mg total) by mouth every 8 (eight) hours as needed for nausea or vomiting. 12/30/19   Ramsey Guadamuz, Harley Hallmark, PA-C  oxyCODONE-acetaminophen (PERCOCET/ROXICET) 5-325 MG tablet Take 1 tablet by mouth every 6 (six) hours as needed for severe pain. 12/30/19   Brittnei Jagiello E, PA-C  tamsulosin (FLOMAX) 0.4 MG CAPS capsule Take 1 capsule (0.4 mg total) by mouth daily after supper for 7 days. 12/30/19 01/06/20  Lexys Milliner, Harley Hallmark, PA-C    Allergies    Patient has no known allergies.  Review of Systems   Review of Systems  All other systems are reviewed and are negative for acute change except as noted in the  HPI.   Physical Exam Updated Vital Signs BP 118/72 (BP Location: Left Arm)    Pulse 66    Temp 98.9 F (37.2 C) (Oral)    Resp 19    Wt 113.4 kg    SpO2 98%    BMI 35.87 kg/m   Physical Exam Vitals and nursing note reviewed.  Constitutional:      General: He is not in acute distress.    Appearance: He is not ill-appearing.  HENT:     Head: Normocephalic and atraumatic.     Right Ear: Tympanic membrane and external ear normal.     Left Ear: Tympanic membrane and external ear normal.     Nose: Nose normal.     Mouth/Throat:     Mouth: Mucous membranes are moist.     Pharynx: Oropharynx is clear.  Eyes:     General: No scleral icterus.       Right eye: No discharge.        Left eye: No discharge.     Extraocular Movements: Extraocular movements intact.     Conjunctiva/sclera: Conjunctivae normal.     Pupils: Pupils are equal, round, and reactive to light.  Neck:     Vascular: No JVD.  Cardiovascular:     Rate and Rhythm: Normal rate and regular rhythm.     Pulses: Normal pulses.          Radial pulses are 2+ on the right side and 2+ on the left side.     Heart sounds: Normal heart sounds.  Pulmonary:     Comments: Lungs clear to auscultation in all fields. Symmetric chest rise. No wheezing, rales, or rhonchi. Abdominal:     Comments: Abdomen is soft, non-distended, and non-tender in all quadrants. No rigidity, no guarding. No peritoneal signs.  Genitourinary:    Comments: Barista present for exam. No discharge or urethritis noted. No signs of sores or lesions or erythema on the penis or testicles. The penis and testicles are nontender. No testicular masses or swelling. No scrotal swelling. No signs of any inguinal hernias. Cremaster reflex present bilaterally.   Musculoskeletal:        General: Normal range of motion.     Cervical back: Normal range of motion.  Skin:    General: Skin is warm and dry.     Capillary Refill: Capillary refill takes less than 2  seconds.  Neurological:     Mental Status: He is oriented to person, place, and time.     GCS: GCS eye subscore is 4. GCS verbal subscore is 5. GCS motor subscore is 6.     Comments: Fluent speech, no facial droop.  Psychiatric:        Behavior: Behavior normal.     ED Results /  Procedures / Treatments   Labs (all labs ordered are listed, but only abnormal results are displayed) Labs Reviewed  URINALYSIS, ROUTINE W REFLEX MICROSCOPIC - Abnormal; Notable for the following components:      Result Value   Hgb urine dipstick MODERATE (*)    All other components within normal limits  CBC WITH DIFFERENTIAL/PLATELET - Abnormal; Notable for the following components:   Eosinophils Absolute 0.7 (*)    Basophils Absolute 0.2 (*)    Abs Immature Granulocytes 0.16 (*)    All other components within normal limits  COMPREHENSIVE METABOLIC PANEL  LIPASE, BLOOD    EKG None  Radiology CT Renal Stone Study  Result Date: 12/30/2019 CLINICAL DATA:  29 year old male with history of right-sided flank pain and painful urination for the past 2 weeks. EXAM: CT ABDOMEN AND PELVIS WITHOUT CONTRAST TECHNIQUE: Multidetector CT imaging of the abdomen and pelvis was performed following the standard protocol without IV contrast. COMPARISON:  CT the abdomen and pelvis 02/13/2019. FINDINGS: Lower chest: Unremarkable. Hepatobiliary: No suspicious cystic or solid hepatic lesions are confidently identified on today's noncontrast CT examination. Unenhanced appearance of the gallbladder is normal. Pancreas: No definite pancreatic mass or peripancreatic fluid collections or inflammatory changes are noted on today's noncontrast CT examination. Spleen: Unremarkable. Adrenals/Urinary Tract: 3 mm nonobstructive calculi in the upper pole collecting system of the left kidney and interpolar collecting system of the right kidney. In addition, in the distal third of the right ureter at or immediately before the right  ureterovesicular junction there is a 3 mm calculus. This is not associated with significant proximal hydroureteronephrosis. No left ureteral or bladder calculi are identified. Unenhanced appearance of the kidneys, bilateral adrenal glands and urinary bladder is otherwise unremarkable. Stomach/Bowel: Unenhanced appearance of the stomach is normal. No pathologic dilatation of small bowel or colon. Normal appendix. Vascular/Lymphatic: No atherosclerotic calcifications in the abdominal aorta or pelvic vasculature. Retroaortic left renal vein (normal anatomical variant) incidentally noted. No lymphadenopathy identified in the abdomen or pelvis. Reproductive: Prostate gland and seminal vesicles are unremarkable in appearance. Other: No significant volume of ascites.  No pneumoperitoneum. Musculoskeletal: There are no aggressive appearing lytic or blastic lesions noted in the visualized portions of the skeleton. IMPRESSION: 1. In addition to 3 mm nonobstructive calculi in the collecting systems of both kidneys, there is a distal right ureteral calculus at or immediately before the right ureterovesicular junction measuring 3 mm. No associated proximal hydroureteronephrosis to indicate urinary tract obstruction at this time. Electronically Signed   By: Trudie Reed M.D.   On: 12/30/2019 15:06    Procedures Procedures (including critical care time)  Medications Ordered in ED Medications - No data to display  ED Course  I have reviewed the triage vital signs and the nursing notes.  Pertinent labs & imaging results that were available during my care of the patient were reviewed by me and considered in my medical decision making (see chart for details).    MDM Rules/Calculators/A&P                     Pt has been diagnosed with a Kidney Stone via CT. There is no evidence of significant hydronephrosis, serum creatine WNL, vitals sign stable and the pt does not have irratractable vomiting. Pt will be dc home  with pain medications & has been advised to follow up with PCP. I have reviewed the PDMP during this encounter. Patient does not have any recent narcotic prescriptions.  The patient appears reasonably  screened and/or stabilized for discharge and I doubt any other medical condition or other [facility] requiring further screening, evaluation, or treatment in the ED at this time prior to discharge. The patient is safe for discharge with strict return precautions discussed. Recommend pcp follow up if symptoms persist.   Portions of this note were generated with Dragon dictation software. Dictation errors may occur despite best attempts at proofreading.    Final Clinical Impression(s) / ED Diagnoses Final diagnoses:  Ureterolithiasis    Rx / DC Orders ED Discharge Orders         Ordered    ondansetron (ZOFRAN ODT) 4 MG disintegrating tablet  Every 8 hours PRN     12/30/19 1541    oxyCODONE-acetaminophen (PERCOCET/ROXICET) 5-325 MG tablet  Every 6 hours PRN     12/30/19 1541    tamsulosin (FLOMAX) 0.4 MG CAPS capsule  Daily after supper     12/30/19 1541    ibuprofen (ADVIL) 800 MG tablet  3 times daily PRN     12/30/19 1541           Sherene Sires, PA-C 12/30/19 1544    Long, Arlyss Repress, MD 12/31/19 [phone]

## 2019-12-30 NOTE — ED Triage Notes (Signed)
Painful urination for the past 2 weeks, also has blood in urine

## 2019-12-30 NOTE — ED Notes (Signed)
Patient to and from CT with no issues

## 2019-12-30 NOTE — ED Notes (Signed)
Patient reports R flank pain with painful urination x2 weeks. Patient reports that he has noted some blood in his urine. Patient reports the flank pain is R lower flank and has never migrated. Patient denies fevers. Patient denies N/V/D and other gastrointestinal symptoms. Patient has no penile discharge or testicular complaints.

## 2019-12-31 ENCOUNTER — Telehealth: Payer: Self-pay | Admitting: Gastroenterology

## 2019-12-31 NOTE — Telephone Encounter (Signed)
Called and informed pt.  

## 2019-12-31 NOTE — Telephone Encounter (Signed)
  Reviewed ED visit. CT without obstructing stones. Continue with plans for EGD unless clinically changes.

## 2019-12-31 NOTE — Telephone Encounter (Signed)
Routing to AB for advice. 

## 2019-12-31 NOTE — Telephone Encounter (Signed)
[phone]  PATIENT SCHEDULED FOR UPCOMING ENDO AND WAS DIAGNOSED WITH KIDNEY STONES YESTERDAY, WANTS TO KNOW IF THIS IS GONG TO AFFECT HAVING HIS PROCEDURE

## 2020-01-01 ENCOUNTER — Ambulatory Visit: Payer: Self-pay | Admitting: Family Medicine

## 2020-01-01 ENCOUNTER — Other Ambulatory Visit: Payer: Self-pay

## 2020-01-01 ENCOUNTER — Encounter (HOSPITAL_COMMUNITY): Payer: Self-pay | Admitting: *Deleted

## 2020-01-01 ENCOUNTER — Encounter: Payer: Self-pay | Admitting: Family Medicine

## 2020-01-01 ENCOUNTER — Emergency Department (HOSPITAL_COMMUNITY)
Admission: EM | Admit: 2020-01-01 | Discharge: 2020-01-02 | Disposition: A | Discharge status: Home / Self Care | Payer: Medicaid Other | Attending: Emergency Medicine | Admitting: Emergency Medicine

## 2020-01-01 DIAGNOSIS — I959 Hypotension, unspecified: Secondary | ICD-10-CM | POA: Diagnosis not present

## 2020-01-01 DIAGNOSIS — R109 Unspecified abdominal pain: Secondary | ICD-10-CM | POA: Diagnosis present

## 2020-01-01 DIAGNOSIS — N201 Calculus of ureter: Secondary | ICD-10-CM | POA: Diagnosis not present

## 2020-01-01 DIAGNOSIS — Z87891 Personal history of nicotine dependence: Secondary | ICD-10-CM | POA: Insufficient documentation

## 2020-01-01 DIAGNOSIS — J45909 Unspecified asthma, uncomplicated: Secondary | ICD-10-CM | POA: Insufficient documentation

## 2020-01-01 DIAGNOSIS — F332 Major depressive disorder, recurrent severe without psychotic features: Secondary | ICD-10-CM

## 2020-01-01 DIAGNOSIS — R4020 Unspecified coma: Secondary | ICD-10-CM | POA: Diagnosis not present

## 2020-01-01 DIAGNOSIS — R52 Pain, unspecified: Secondary | ICD-10-CM | POA: Diagnosis not present

## 2020-01-01 DIAGNOSIS — R42 Dizziness and giddiness: Secondary | ICD-10-CM | POA: Diagnosis not present

## 2020-01-01 DIAGNOSIS — Z79899 Other long term (current) drug therapy: Secondary | ICD-10-CM | POA: Diagnosis not present

## 2020-01-01 DIAGNOSIS — N200 Calculus of kidney: Secondary | ICD-10-CM | POA: Diagnosis not present

## 2020-01-01 LAB — CBC WITH DIFFERENTIAL/PLATELET
Abs Immature Granulocytes: 0.16 10*3/uL — ABNORMAL HIGH (ref 0.00–0.07)
Basophils Absolute: 0.1 10*3/uL (ref 0.0–0.1)
Basophils Relative: 1 %
Eosinophils Absolute: 0.1 10*3/uL (ref 0.0–0.5)
Eosinophils Relative: 0 %
HCT: 46.4 % (ref 39.0–52.0)
Hemoglobin: 15.3 g/dL (ref 13.0–17.0)
Immature Granulocytes: 1 %
Lymphocytes Relative: 10 %
Lymphs Abs: 1.5 10*3/uL (ref 0.7–4.0)
MCH: 29 pg (ref 26.0–34.0)
MCHC: 33 g/dL (ref 30.0–36.0)
MCV: 88 fL (ref 80.0–100.0)
Monocytes Absolute: 0.7 10*3/uL (ref 0.1–1.0)
Monocytes Relative: 5 %
Neutro Abs: 13.1 10*3/uL — ABNORMAL HIGH (ref 1.7–7.7)
Neutrophils Relative %: 83 %
Platelets: 271 10*3/uL (ref 150–400)
RBC: 5.27 MIL/uL (ref 4.22–5.81)
RDW: 11.5 % (ref 11.5–15.5)
WBC: 15.6 10*3/uL — ABNORMAL HIGH (ref 4.0–10.5)
nRBC: 0 % (ref 0.0–0.2)

## 2020-01-01 LAB — LIPASE, BLOOD: Lipase: 20 U/L (ref 11–51)

## 2020-01-01 LAB — COMPREHENSIVE METABOLIC PANEL
ALT: 32 U/L (ref 0–44)
AST: 26 U/L (ref 15–41)
Albumin: 4.4 g/dL (ref 3.5–5.0)
Alkaline Phosphatase: 93 U/L (ref 38–126)
Anion gap: 12 (ref 5–15)
BUN: 16 mg/dL (ref 6–20)
CO2: 24 mmol/L (ref 22–32)
Calcium: 9.5 mg/dL (ref 8.9–10.3)
Chloride: 103 mmol/L (ref 98–111)
Creatinine, Ser: 1.22 mg/dL (ref 0.61–1.24)
GFR calc Af Amer: >60 mL/min (ref >60)
GFR calc non Af Amer: >60 mL/min (ref >60)
Glucose, Bld: 134 mg/dL — ABNORMAL HIGH (ref 70–99)
Potassium: 3.6 mmol/L (ref 3.5–5.1)
Sodium: 139 mmol/L (ref 135–145)
Total Bilirubin: 0.6 mg/dL (ref 0.3–1.2)
Total Protein: 8.1 g/dL (ref 6.5–8.1)

## 2020-01-01 LAB — URINALYSIS, ROUTINE W REFLEX MICROSCOPIC
Bacteria, UA: NONE SEEN
Bilirubin Urine: NEGATIVE
Glucose, UA: 150 mg/dL — AB
Ketones, ur: 20 mg/dL — AB
Leukocytes,Ua: NEGATIVE
Nitrite: NEGATIVE
Protein, ur: 100 mg/dL — AB
Specific Gravity, Urine: 1.032 — ABNORMAL HIGH (ref 1.005–1.030)
pH: 5 (ref 5.0–8.0)

## 2020-01-01 MED ORDER — ONDANSETRON HCL 4 MG/2ML IJ SOLN
4.0000 mg | Freq: Once | INTRAMUSCULAR | Status: AC
  Administered 2020-01-01: 4 mg via INTRAVENOUS
  Filled 2020-01-01: qty 2

## 2020-01-01 MED ORDER — HYDROMORPHONE HCL 1 MG/ML IJ SOLN
1.0000 mg | Freq: Once | INTRAMUSCULAR | Status: AC
  Administered 2020-01-01: 1 mg via INTRAVENOUS
  Filled 2020-01-01: qty 1

## 2020-01-01 MED ORDER — KETOROLAC TROMETHAMINE 30 MG/ML IJ SOLN
30.0000 mg | Freq: Once | INTRAMUSCULAR | Status: AC
  Administered 2020-01-01: 30 mg via INTRAVENOUS
  Filled 2020-01-01: qty 1

## 2020-01-01 MED ORDER — HYDROMORPHONE HCL 1 MG/ML IJ SOLN
0.5000 mg | Freq: Once | INTRAMUSCULAR | Status: AC
  Administered 2020-01-01: 0.5 mg via INTRAVENOUS
  Filled 2020-01-01: qty 1

## 2020-01-01 MED ORDER — SODIUM CHLORIDE 0.9 % IV BOLUS
1000.0000 mL | Freq: Once | INTRAVENOUS | Status: AC
  Administered 2020-01-01: 1000 mL via INTRAVENOUS

## 2020-01-01 MED ORDER — VENLAFAXINE HCL ER 37.5 MG PO CP24: 37.5000 mg | ORAL_CAPSULE | Freq: Every day | ORAL | 2 refills | Status: DC

## 2020-01-01 NOTE — ED Triage Notes (Signed)
Pt c/o right side flank pain that radiates to right lower abd and into testicle that started today with n/v,

## 2020-01-01 NOTE — Discharge Instructions (Signed)
You have been seen today for flank pain. Please read and follow all provided instructions. Return to the emergency room for worsening condition or new concerning symptoms including worsening pain, nausea, vomiting, testicular pain, unable to urinate or worsening pain with with urination.  1. Medications:  Continue the medications you were prescribed at last ED visit.   Continue usual home medications Take medications as prescribed. Please review all of the medicines and only take them if you do not have an allergy to them.   2. Treatment: rest, drink plenty of fluids  3. Follow Up:  Please follow up with primary care provider by scheduling an appointment as soon as possible for a visit     It is also a possibility that you have an allergic reaction to any of the medicines that you have been prescribed - Everybody reacts differently to medications and while MOST people have no trouble with most medicines, you may have a reaction such as nausea, vomiting, rash, swelling, shortness of breath. If this is the case, please stop taking the medicine immediately and contact your physician.  ?

## 2020-01-01 NOTE — ED Provider Notes (Signed)
[facility] EMERGENCY DEPARTMENT Provider Note   CSN: [id number] Arrival date & time: 01/01/20  1837     History Chief Complaint  Patient presents with  . Flank Pain    Ernest Cochran is a 29 y.o. male with past medical history significant for asthma, depression, GERD, kidney stones presenting to emergency room today with chief complaint of acute onset of right-sided flank pain that radiates into his right testicle.  He also endorses associated nausea and 2 episodes of nonbloody nonbilious emesis.  He states the pain is constant, sharp and is progressively worsening.  He rates pain 10 of 10 in severity.  He states he felt well until this afternoon.  He was out fishing all morning and had decreased p.o. intake, only had 1 Powerade today.  I saw patient in the emergency department x2 days ago and diagnosed him with a 3 mm stone immediately before the right ureterovesicular junction via CT renal. There were no signs of urinary tract obstruction seen on the scan. He was discharged home with Percocet, ibuprofen, Flomax and Zofran.   He has felt well since being discharged and has not had to take any pain medications until he had the sudden onset of of abdominal pain today.  He took 1 Percocet without any relief.  He denies any fever, chills, chest pain, shortness of breath, gross hematuria, urinary frequency, dysuria, Penile pain or swelling, scrotal swelling, penile discharge, diarrhea. History provided by patient with additional history obtained from chart review.     Past Medical History:  Diagnosis Date  . Asthma   . Depression   . GERD (gastroesophageal reflux disease)   . Kidney stone     Patient Active Problem List   Diagnosis Date Noted  . Hematuria 12/25/2019  . Depression   . Dyspepsia 07/15/2019  . Dysphagia 07/15/2019    Past Surgical History:  Procedure Laterality Date  . ESOPHAGOGASTRODUODENOSCOPY (EGD) WITH PROPOFOL N/A 10/03/2019   Procedure:  ESOPHAGOGASTRODUODENOSCOPY (EGD) WITH PROPOFOL;  Surgeon: West Bali, MD;  [facility];  Service: Endoscopy;  Laterality: N/A;  8:30am  . SAVORY DILATION N/A 10/03/2019   Procedure: SAVORY DILATION;  Surgeon: West Bali, MD;  Location: [facility];  Service: Endoscopy;  Laterality: N/A;  15/16/17       Family History  Problem Relation Age of Onset  . Colon cancer Maternal Grandfather   . Diabetes Maternal Grandfather   . Heart disease Maternal Grandfather   . Lung cancer Maternal Grandmother   . Asthma Son   . Colon polyps Neg Hx     Social History   Tobacco Use  . Smoking status: Former Smoker    Types: Cigarettes  . Smokeless tobacco: Never Used  Substance Use Topics  . Alcohol use: Not Currently    Comment: occasional. 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   . Drug use: Not Currently    Types: Marijuana    Comment: denied 07/15/19, stopped 3-4 months ago.     Home Medications Prior to Admission medications   Medication Sig Start Date End Date Taking? Authorizing Provider  ibuprofen (ADVIL) 800 MG tablet Take 1 tablet (800 mg total) by mouth 3 (three) times daily as needed. 12/30/19  Yes Linden Mikes E, PA-C  lansoprazole (PREVACID) 30 MG capsule 1 po 30 mins prior to FIRST AND LAST MEAL Patient taking differently: Take 30 mg by mouth 2 (two) times daily at 8 am and 10 pm.  10/03/19  Yes Fields, Sandi L, MD  ondansetron (ZOFRAN ODT) 4 MG disintegrating tablet Take 1 tablet (4 mg total) by mouth every 8 (eight) hours as needed for nausea or vomiting. 12/30/19  Yes Piedad Standiford E, PA-C  oxyCODONE-acetaminophen (PERCOCET/ROXICET) 5-325 MG tablet Take 1 tablet by mouth every 6 (six) hours as needed for severe pain. 12/30/19  Yes Luretta Everly E, PA-C  tamsulosin (FLOMAX) 0.4 MG CAPS capsule Take 1 capsule (0.4 mg total) by mouth daily after supper for 7 days. 12/30/19 01/06/20 Yes Sierria Bruney E, PA-C  venlafaxine XR (EFFEXOR  XR) 37.5 MG 24 hr capsule Take 1 capsule (37.5 mg total) by mouth daily. 01/01/20  Yes Gwenlyn Fudge, FNP    Allergies    Patient has no known allergies.  Review of Systems   Review of Systems All other systems are reviewed and are negative for acute change except as noted in the HPI.  Physical Exam Updated Vital Signs BP 134/68   Pulse 90   Temp 98.2 F (36.8 C)   Resp 20   Ht 5\' 10"  (1.778 m)   Wt 113.3 kg   SpO2 100%   BMI 35.84 kg/m   Physical Exam Vitals and nursing note reviewed.  Constitutional:      General: He is not in acute distress.    Appearance: He is ill-appearing. He is not toxic-appearing or diaphoretic.  HENT:     Head: Normocephalic and atraumatic.     Right Ear: Tympanic membrane and external ear normal.     Left Ear: Tympanic membrane and external ear normal.     Nose: Nose normal.     Mouth/Throat:     Mouth: Mucous membranes are dry.     Pharynx: Oropharynx is clear.  Eyes:     General: No scleral icterus.       Right eye: No discharge.        Left eye: No discharge.     Extraocular Movements: Extraocular movements intact.     Conjunctiva/sclera: Conjunctivae normal.     Pupils: Pupils are equal, round, and reactive to light.  Neck:     Vascular: No JVD.  Cardiovascular:     Rate and Rhythm: Normal rate and regular rhythm.     Pulses: Normal pulses.          Radial pulses are 2+ on the right side and 2+ on the left side.     Heart sounds: Normal heart sounds.  Pulmonary:     Comments: Lungs clear to auscultation in all fields. Symmetric chest rise. No wheezing, rales, or rhonchi. Abdominal:     Comments: Abdomen is soft, non-distended. No abdominal tenderness. No rigidity, no guarding. No peritoneal signs. Normoactive bowel sounds  Genitourinary:    Comments: Chaperone Michael NT present for exam. No discharge or urethritis noted. No signs of sores or lesions or erythema on the penis or testicles. The penis is non tender. Mild tenderness  to palpation of right testicle. No testicular masses or swelling. No scrotal swelling. No signs of any inguinal hernias. Cremaster reflex present bilaterally.   Musculoskeletal:        General: Normal range of motion.     Cervical back: Normal range of motion.     Comments: Tenderness to palpation of right flank. No overlying skin changes, edema, erythema.  Full ROM of cervical, thoracic, lumbar spine. No crepitus or deformity. No midline spinal tenderness.  Skin:    General: Skin is warm and dry.  Capillary Refill: Capillary refill takes less than 2 seconds.  Neurological:     Mental Status: He is oriented to person, place, and time.     GCS: GCS eye subscore is 4. GCS verbal subscore is 5. GCS motor subscore is 6.     Comments: Fluent speech, no facial droop.  Psychiatric:        Behavior: Behavior normal.       ED Results / Procedures / Treatments   Labs (all labs ordered are listed, but only abnormal results are displayed) Labs Reviewed  COMPREHENSIVE METABOLIC PANEL - Abnormal; Notable for the following components:      Result Value   Glucose, Bld 134 (*)    All other components within normal limits  CBC WITH DIFFERENTIAL/PLATELET - Abnormal; Notable for the following components:   WBC 15.6 (*)    Neutro Abs 13.1 (*)    Abs Immature Granulocytes 0.16 (*)    All other components within normal limits  URINALYSIS, ROUTINE W REFLEX MICROSCOPIC - Abnormal; Notable for the following components:   APPearance HAZY (*)    Specific Gravity, Urine 1.032 (*)    Glucose, UA 150 (*)    Hgb urine dipstick MODERATE (*)    Ketones, ur 20 (*)    Protein, ur 100 (*)    All other components within normal limits  LIPASE, BLOOD    EKG None  Radiology No results found.  Procedures Procedures (including critical care time)  Medications Ordered in ED Medications  HYDROmorphone (DILAUDID) injection 1 mg (1 mg Intravenous Given 01/01/20 2039)  ondansetron (ZOFRAN) injection 4 mg  (4 mg Intravenous Given 01/01/20 2039)  ketorolac (TORADOL) 30 MG/ML injection 30 mg (30 mg Intravenous Given 01/01/20 2054)  HYDROmorphone (DILAUDID) injection 0.5 mg (0.5 mg Intravenous Given 01/01/20 2155)  sodium chloride 0.9 % bolus 1,000 mL (1,000 mLs Intravenous New Bag/Given 01/01/20 2155)    ED Course  I have reviewed the triage vital signs and the nursing notes.  Pertinent labs & imaging results that were available during my care of the patient were reviewed by me and considered in my medical decision making (see chart for details).    MDM Rules/Calculators/A&P                      Patient seen and examined. Patient presents awake, alert, hemodynamically stable, afebrile.  He looks uncomfortable on arrival however is nontoxic appearing.  He is not tachycardic or hypoxic.  On exam he has no abdominal tenderness.  He does have tenderness to palpation of the right flank.  GU exam chaperoned, he has mild tenderness to palpation of right testicle without erythema or swelling.  Cremasteric reflex present bilaterally.  Discussed with ED attending Dr. Rhunette Croft and we feel that testicular torsion is unlikely cause of his pain given his age and overall reassuring exam today. As it is the evening, we do not [place] here. Engaged in shared decision making with patient in regards to [place] for testicular torsion. Patient does not wish to be transferred to [facility] for testicular US.   Patient given IV Dilaudid, toradol and IVF.  On reassessment he is resting comfortably.  He states pain is significantly improved.  UA is without signs of infection, does suggest dehydration.  CMP unremarkable, no serial derangement, no renal insufficiency. CBC with leukocytosis of 15.6. This is up from last ED visit x2 days ago. Possibly related to vomiting, felt less likely to be infectious as he is  afebrile and with no other infectious symptoms. He is tolerating PO intake. Discussed the importance of hydration as he does appear  dehydrated with his UA today. Given he his pain is improved and he is tolerating PO intake without emesis, feel that can can be discharged home to take the prescriptions he had filled at last ED visit.  The patient appears reasonably screened and/or stabilized for discharge and I doubt any other medical condition or other [facility] requiring further screening, evaluation, or treatment in the ED at this time prior to discharge. The patient is safe for discharge with strict return precautions discussed. Recommend pcp follow up if symptoms persist.     Portions of this note were generated with Dragon dictation software. Dictation errors may occur despite best attempts at proofreading.    Final Clinical Impression(s) / ED Diagnoses Final diagnoses:  Ureterolithiasis    Rx / DC Orders ED Discharge Orders    None       Flint Melter 01/01/20 2206    Varney Biles, MD 01/02/20 1507

## 2020-01-03 ENCOUNTER — Encounter (HOSPITAL_COMMUNITY)
Admission: RE | Admit: 2020-01-03 | Discharge: 2020-01-03 | Disposition: A | Discharge status: Home / Self Care | Payer: Medicaid Other | Source: Ambulatory Visit | Attending: Gastroenterology | Admitting: Gastroenterology

## 2020-01-03 ENCOUNTER — Other Ambulatory Visit (HOSPITAL_COMMUNITY)
Admission: RE | Admit: 2020-01-03 | Discharge: 2020-01-03 | Disposition: A | Discharge status: Home / Self Care | Payer: Medicaid Other | Source: Ambulatory Visit | Attending: Gastroenterology | Admitting: Gastroenterology

## 2020-01-03 ENCOUNTER — Other Ambulatory Visit: Payer: Self-pay

## 2020-01-03 DIAGNOSIS — Z20822 Contact with and (suspected) exposure to covid-19: Secondary | ICD-10-CM | POA: Insufficient documentation

## 2020-01-03 DIAGNOSIS — Z01812 Encounter for preprocedural laboratory examination: Secondary | ICD-10-CM | POA: Insufficient documentation

## 2020-01-03 LAB — SARS CORONAVIRUS 2 (TAT 6-24 HRS): SARS Coronavirus 2: NEGATIVE

## 2020-01-07 ENCOUNTER — Encounter (HOSPITAL_COMMUNITY)
Admission: RE | Disposition: A | Discharge status: Home / Self Care | Payer: Self-pay | Source: Home / Self Care | Attending: Gastroenterology

## 2020-01-07 ENCOUNTER — Ambulatory Visit (HOSPITAL_COMMUNITY): Payer: Medicaid Other | Admitting: Anesthesiology

## 2020-01-07 ENCOUNTER — Ambulatory Visit (HOSPITAL_COMMUNITY)
Admission: RE | Admit: 2020-01-07 | Discharge: 2020-01-07 | Disposition: A | Discharge status: Home / Self Care | Payer: Medicaid Other | Attending: Gastroenterology | Admitting: Gastroenterology

## 2020-01-07 ENCOUNTER — Encounter (HOSPITAL_COMMUNITY): Payer: Self-pay | Admitting: Gastroenterology

## 2020-01-07 DIAGNOSIS — Z87891 Personal history of nicotine dependence: Secondary | ICD-10-CM | POA: Insufficient documentation

## 2020-01-07 DIAGNOSIS — J45909 Unspecified asthma, uncomplicated: Secondary | ICD-10-CM | POA: Insufficient documentation

## 2020-01-07 DIAGNOSIS — Z8249 Family history of ischemic heart disease and other diseases of the circulatory system: Secondary | ICD-10-CM | POA: Insufficient documentation

## 2020-01-07 DIAGNOSIS — Z79899 Other long term (current) drug therapy: Secondary | ICD-10-CM | POA: Diagnosis not present

## 2020-01-07 DIAGNOSIS — K2980 Duodenitis without bleeding: Secondary | ICD-10-CM | POA: Diagnosis not present

## 2020-01-07 DIAGNOSIS — Z87442 Personal history of urinary calculi: Secondary | ICD-10-CM | POA: Insufficient documentation

## 2020-01-07 DIAGNOSIS — Z801 Family history of malignant neoplasm of trachea, bronchus and lung: Secondary | ICD-10-CM | POA: Diagnosis not present

## 2020-01-07 DIAGNOSIS — Z825 Family history of asthma and other chronic lower respiratory diseases: Secondary | ICD-10-CM | POA: Diagnosis not present

## 2020-01-07 DIAGNOSIS — K200 Eosinophilic esophagitis: Secondary | ICD-10-CM | POA: Diagnosis not present

## 2020-01-07 DIAGNOSIS — Z833 Family history of diabetes mellitus: Secondary | ICD-10-CM | POA: Diagnosis not present

## 2020-01-07 DIAGNOSIS — R112 Nausea with vomiting, unspecified: Secondary | ICD-10-CM | POA: Diagnosis not present

## 2020-01-07 DIAGNOSIS — Z800 Family history of malignant neoplasm of digestive organs: Secondary | ICD-10-CM | POA: Diagnosis not present

## 2020-01-07 DIAGNOSIS — Z791 Long term (current) use of non-steroidal anti-inflammatories (NSAID): Secondary | ICD-10-CM | POA: Diagnosis not present

## 2020-01-07 DIAGNOSIS — F329 Major depressive disorder, single episode, unspecified: Secondary | ICD-10-CM | POA: Diagnosis not present

## 2020-01-07 DIAGNOSIS — K219 Gastro-esophageal reflux disease without esophagitis: Secondary | ICD-10-CM | POA: Insufficient documentation

## 2020-01-07 DIAGNOSIS — R1310 Dysphagia, unspecified: Secondary | ICD-10-CM | POA: Diagnosis not present

## 2020-01-07 DIAGNOSIS — R197 Diarrhea, unspecified: Secondary | ICD-10-CM | POA: Diagnosis not present

## 2020-01-07 HISTORY — PX: ESOPHAGOGASTRODUODENOSCOPY (EGD) WITH PROPOFOL: SHX5813

## 2020-01-07 HISTORY — PX: BIOPSY: SHX5522

## 2020-01-07 SURGERY — ESOPHAGOGASTRODUODENOSCOPY (EGD) WITH PROPOFOL
Anesthesia: General

## 2020-01-07 MED ORDER — PROPOFOL 10 MG/ML IV BOLUS
INTRAVENOUS | Status: AC
  Filled 2020-01-07: qty 140

## 2020-01-07 MED ORDER — CHLORHEXIDINE GLUCONATE CLOTH 2 % EX PADS: 6.0000 | MEDICATED_PAD | Freq: Once | CUTANEOUS | Status: DC

## 2020-01-07 MED ORDER — PROPOFOL 500 MG/50ML IV EMUL
INTRAVENOUS | Status: DC | PRN
  Administered 2020-01-07: 200 ug/kg/min via INTRAVENOUS

## 2020-01-07 MED ORDER — PROMETHAZINE HCL 25 MG/ML IJ SOLN: 6.2500 mg | INTRAMUSCULAR | Status: DC | PRN

## 2020-01-07 MED ORDER — GLYCOPYRROLATE PF 0.2 MG/ML IJ SOSY
PREFILLED_SYRINGE | INTRAMUSCULAR | Status: AC
  Filled 2020-01-07: qty 1

## 2020-01-07 MED ORDER — HYDROMORPHONE HCL 1 MG/ML IJ SOLN: 0.2500 mg | INTRAMUSCULAR | Status: DC | PRN

## 2020-01-07 MED ORDER — PROPOFOL 10 MG/ML IV BOLUS
INTRAVENOUS | Status: DC | PRN
  Administered 2020-01-07: 70 mg via INTRAVENOUS

## 2020-01-07 MED ORDER — LACTATED RINGERS IV SOLN: INTRAVENOUS | Status: DC | PRN

## 2020-01-07 MED ORDER — LIDOCAINE VISCOUS HCL 2 % MT SOLN
OROMUCOSAL | Status: DC | PRN
  Administered 2020-01-07: 6 mL via OROMUCOSAL

## 2020-01-07 MED ORDER — LACTATED RINGERS IV SOLN: INTRAVENOUS | Status: DC

## 2020-01-07 MED ORDER — KETAMINE HCL 10 MG/ML IJ SOLN
INTRAMUSCULAR | Status: DC | PRN
  Administered 2020-01-07: 10 mg via INTRAVENOUS

## 2020-01-07 MED ORDER — LIDOCAINE VISCOUS HCL 2 % MT SOLN
OROMUCOSAL | Status: AC
  Filled 2020-01-07: qty 15

## 2020-01-07 MED ORDER — LIDOCAINE HCL (CARDIAC) PF 100 MG/5ML IV SOSY
PREFILLED_SYRINGE | INTRAVENOUS | Status: DC | PRN
  Administered 2020-01-07: 50 mg via INTRATRACHEAL

## 2020-01-07 MED ORDER — KETAMINE HCL 50 MG/5ML IJ SOSY
PREFILLED_SYRINGE | INTRAMUSCULAR | Status: AC
  Filled 2020-01-07: qty 5

## 2020-01-07 MED ORDER — LIDOCAINE 2% (20 MG/ML) 5 ML SYRINGE
INTRAMUSCULAR | Status: AC
  Filled 2020-01-07: qty 5

## 2020-01-07 MED ORDER — HYDROCODONE-ACETAMINOPHEN 7.5-325 MG PO TABS: 1.0000 | ORAL_TABLET | Freq: Once | ORAL | Status: DC | PRN

## 2020-01-07 MED ORDER — DEXMEDETOMIDINE HCL 200 MCG/2ML IV SOLN
INTRAVENOUS | Status: DC | PRN
  Administered 2020-01-07: 4 ug via INTRAVENOUS

## 2020-01-07 MED ORDER — LANSOPRAZOLE 30 MG PO CPDR: DELAYED_RELEASE_CAPSULE | ORAL | 5 refills | Status: DC

## 2020-01-07 MED ORDER — LIDOCAINE VISCOUS HCL 2 % MT SOLN
5.0000 mL | Freq: Once | OROMUCOSAL | Status: AC
  Administered 2020-01-07: 5 mL via OROMUCOSAL

## 2020-01-07 MED ORDER — ONDANSETRON HCL 4 MG/2ML IJ SOLN
INTRAMUSCULAR | Status: AC
  Filled 2020-01-07: qty 2

## 2020-01-07 MED ORDER — MIDAZOLAM HCL 2 MG/2ML IJ SOLN: 0.5000 mg | Freq: Once | INTRAMUSCULAR | Status: DC | PRN

## 2020-01-07 NOTE — H&P (Addendum)
Primary Care Physician:  Loman Brooklyn, FNP Primary Gastroenterologist:  Dr. Oneida Alar  Pre-Procedure History & Physical: HPI:  Ernest Cochran is a 29 y.o. male here for DYSPHAGIA/dyspepsia/EOSINOPHILIC ESOPHAGITIS.  Past Medical History:  Diagnosis Date  . Asthma   . Depression   . GERD (gastroesophageal reflux disease)   . Kidney stone     Past Surgical History:  Procedure Laterality Date  . ESOPHAGOGASTRODUODENOSCOPY (EGD) WITH PROPOFOL N/A 10/03/2019   Procedure: ESOPHAGOGASTRODUODENOSCOPY (EGD) WITH PROPOFOL;  Surgeon: Danie Binder, MD;  Location: [facility];  Service: Endoscopy;  Laterality: N/A;  8:30am  . SAVORY DILATION N/A 10/03/2019   Procedure: SAVORY DILATION;  Surgeon: Danie Binder, MD;  Location: [facility];  Service: Endoscopy;  Laterality: N/A;  15/16/17    Prior to Admission medications   Medication Sig Start Date End Date Taking? Authorizing Provider  ibuprofen (ADVIL) 800 MG tablet Take 1 tablet (800 mg total) by mouth 3 (three) times daily as needed. 12/30/19  Yes Albrizze, Kaitlyn E, PA-C  lansoprazole (PREVACID) 30 MG capsule 1 po 30 mins prior to [place] Patient taking differently: Take 30 mg by mouth 2 (two) times daily at 8 am and 10 pm.  10/03/19  Yes Sachiko Methot L, MD  ondansetron (ZOFRAN ODT) 4 MG disintegrating tablet Take 1 tablet (4 mg total) by mouth every 8 (eight) hours as needed for nausea or vomiting. 12/30/19  Yes Albrizze, Kaitlyn E, PA-C  oxyCODONE-acetaminophen (PERCOCET/ROXICET) 5-325 MG tablet Take 1 tablet by mouth every 6 (six) hours as needed for severe pain. 12/30/19  Yes Albrizze, Kaitlyn E, PA-C  venlafaxine XR (EFFEXOR XR) 37.5 MG 24 hr capsule Take 1 capsule (37.5 mg total) by mouth daily. 01/01/20  Yes Loman Brooklyn, FNP    Allergies as of 10/07/2019  . (No Known Allergies)    Family History  Problem Relation Age of Onset  . Colon cancer Maternal Grandfather   . Diabetes Maternal Grandfather   .  Heart disease Maternal Grandfather   . Lung cancer Maternal Grandmother   . Asthma Son   . Colon polyps Neg Hx     Social History   Socioeconomic History  . Marital status: Married    Spouse name: Not on file  . Number of children: Not on file  . Years of education: Not on file  . Highest education level: Not on file  Occupational History  . Not on file  Tobacco Use  . Smoking status: Former Smoker    Types: Cigarettes  . Smokeless tobacco: Never Used  Substance and Sexual Activity  . Alcohol use: Not Currently    Comment: occasional. 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   . Drug use: Not Currently    Types: Marijuana    Comment: denied 07/15/19, stopped 3-4 months ago.   Marland Kitchen Sexual activity: Not on file  Other Topics Concern  . Not on file  Social History Narrative  . Not on file   Social Determinants of Health   Financial Resource Strain:   . Difficulty of Paying Living Expenses: Not on file  Food Insecurity:   . Worried About Charity fundraiser in the Last Year: Not on file  . Ran Out of Food in the Last Year: Not on file  Transportation Needs:   . Lack of Transportation (Medical): Not on file  . Lack of Transportation (Non-Medical): Not on file  Physical Activity:   .  Days of Exercise per Week: Not on file  . Minutes of Exercise per Session: Not on file  Stress:   . Feeling of Stress : Not on file  Social Connections:   . Frequency of Communication with Friends and Family: Not on file  . Frequency of Social Gatherings with Friends and Family: Not on file  . Attends Religious Services: Not on file  . Active Member of Clubs or Organizations: Not on file  . Attends Banker Meetings: Not on file  . Marital Status: Not on file  Intimate Partner Violence:   . Fear of Current or Ex-Partner: Not on file  . Emotionally Abused: Not on file  . Physically Abused: Not on file  . Sexually Abused: Not on file    Review of Systems: See  HPI, otherwise negative ROS   Physical Exam: BP 126/75   Pulse 68   Temp 98 F (36.7 C)   Resp 20   Ht 5\' 10"  (1.778 m)   Wt 110.2 kg   SpO2 98%   BMI 34.87 kg/m  General:   Alert,  pleasant and cooperative in NAD Head:  Normocephalic and atraumatic. Neck:  Supple; Lungs:  Clear throughout to auscultation.    Heart:  Regular rate and rhythm. Abdomen:  Soft, nontender and nondistended. Normal bowel sounds, without guarding, and without rebound.   Neurologic:  Alert and  oriented x4;  grossly normal neurologically.  Impression/Plan:    DYSPHAGIA/dyspepsia  PLAN:  EGD/DIL TODAY. DISCUSSED PROCEDURE, BENEFITS, & RISKS: < 1% chance of medication reaction, bleeding, ASPIRATION, OR perforation.

## 2020-01-07 NOTE — Anesthesia Preprocedure Evaluation (Signed)
Anesthesia Evaluation  Patient identified by MRN, date of birth, ID band Patient awake    Reviewed: Allergy & Precautions, NPO status , Patient's Chart, lab work & pertinent test results  Airway Mallampati: II  TM Distance: >3 FB Neck ROM: Full    Dental no notable dental hx. (+) Teeth Intact   Pulmonary asthma , former smoker,    Pulmonary exam normal breath sounds clear to auscultation       Cardiovascular Exercise Tolerance: Good negative cardio ROS Normal cardiovascular examI Rhythm:Regular Rate:Normal     Neuro/Psych Depression negative neurological ROS  negative psych ROS   GI/Hepatic Neg liver ROS, GERD  Medicated and Controlled,  Endo/Other  negative endocrine ROS  Renal/GU Renal diseaseH/o kidney stone   negative genitourinary   Musculoskeletal negative musculoskeletal ROS (+)   Abdominal   Peds negative pediatric ROS (+)  Hematology negative hematology ROS (+)   Anesthesia Other Findings   Reproductive/Obstetrics negative OB ROS                             Anesthesia Physical Anesthesia Plan  ASA: II  Anesthesia Plan: General   Post-op Pain Management:    Induction: Intravenous  PONV Risk Score and Plan: 2 and Propofol infusion, TIVA and Treatment may vary due to age or medical condition  Airway Management Planned: Nasal Cannula and Simple Face Mask  Additional Equipment:   Intra-op Plan:   Post-operative Plan:   Informed Consent: I have reviewed the patients History and Physical, chart, labs and discussed the procedure including the risks, benefits and alternatives for the proposed anesthesia with the patient or authorized representative who has indicated his/her understanding and acceptance.     Dental advisory given  Plan Discussed with: CRNA  Anesthesia Plan Comments: (Plan Full PPE use  Plan GA with GETA as needed d/w pt -WTP with same after Q&A)         Anesthesia Quick Evaluation

## 2020-01-07 NOTE — Transfer of Care (Signed)
Immediate Anesthesia Transfer of Care Note  Patient: [name]  Procedure(s) Performed: ESOPHAGOGASTRODUODENOSCOPY (EGD) WITH PROPOFOL (N/A ) BIOPSY  Patient Location: PACU  Anesthesia Type:General  Level of Consciousness: awake, alert  and oriented  Airway & Oxygen Therapy: Patient Spontanous Breathing and Patient connected to nasal cannula oxygen  Post-op Assessment: Report given to RN, Post -op Vital signs reviewed and stable and Patient moving all extremities X 4  Post vital signs: Reviewed and stable  Last Vitals:  Vitals Value Taken Time  BP 106/64 01/07/20 0800  Temp    Pulse 58 01/07/20 0801  Resp 19 01/07/20 0801  SpO2 97 % 01/07/20 0801  Vitals shown include unvalidated device data.  Last Pain:  Vitals:   01/07/20 0735  PainSc: 0-No pain      Patients Stated Pain Goal: 5 (01/07/20 9381)  Complications: No apparent anesthesia complications

## 2020-01-07 NOTE — Anesthesia Postprocedure Evaluation (Signed)
Anesthesia Post Note  Patient: Ernest Cochran  Procedure(s) Performed: ESOPHAGOGASTRODUODENOSCOPY (EGD) WITH PROPOFOL (N/A ) BIOPSY  Patient location during evaluation: Endoscopy Anesthesia Type: General Level of consciousness: awake and alert Pain management: pain level controlled Vital Signs Assessment: post-procedure vital signs reviewed and stable Respiratory status: spontaneous breathing, nonlabored ventilation, respiratory function stable and patient connected to nasal cannula oxygen Cardiovascular status: blood pressure returned to baseline and stable Postop Assessment: no apparent nausea or vomiting Anesthetic complications: no     Last Vitals:  Vitals:   01/07/20 0646 01/07/20 0800  BP: 126/75   Pulse: 68   Resp: 20   Temp: 36.7 C (P) 36.4 C  SpO2: 98%     Last Pain:  Vitals:   01/07/20 0800  PainSc: (P) 0-No pain                 Ulice Bold Yuleni Burich

## 2020-01-07 NOTE — Discharge Instructions (Signed)
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 YOU HAVE MILD ESOPHAGITIS.  I biopsied your ESOPHAGUS AND SMALL BOWEL.   DRINK WATER TO KEEP YOUR URINE LIGHT YELLOW.  AVOID REFLUX TRIGGERS. SEE INFO BELOW.  STRICTLY FOLLOW A LOW FAT DIET. AVOID FAST FOOD. AVOID FRIED FOODS. MEATS SHOULD BE BAKED, BROILED, OR BOILED.  CONTINUE YOUR WEIGHT LOSS EFFORTS. YOUR BODY MASS INDEX IS OVER 40 WHICH MEANS YOU ARE MORBIDLY OBESE. OBESITY IS ASSOCIATED WITH AN INCREASED FOR CIRRHOSIS AND ALL CANCERS, INCLUDING ESOPHAGEAL AND COLON CANCER  AND DECREASES YOUR LIFE EXPECTANCY 10 YEARS. I RECOMMEND YOU READ AND FOLLOW RECOMMENDATIONS BY DR. MARK HYMAN, "10-DAY DETOX DIET".   CONTINUE PREVACID. TAKE 30 MINUTES PRIOR TO BREAKFAST and supper.  USE PEPCID OR TAGAMET WHEN NEEDED FOR BREAKTHROUGH HEARTBURN.   YOUR BIOPSY RESULTS WILL BE BACK IN 5 BUSINESS DAYS.  FOLLOW UP IN 4 MOS.  UPPER ENDOSCOPY AFTER CARE Read the instructions outlined below and refer to this sheet in the next week. These discharge instructions provide you with general information on caring for yourself after you leave the hospital. While your treatment has been planned according to the most current medical practices available, unavoidable complications occasionally occur. If you have any problems or questions after discharge, call DR. Dorella Laster, [phone].  ACTIVITY  You may resume your regular activity, but move at a slower pace for the next 24 hours.   Take frequent rest periods for the next 24 hours.   Walking will help get rid of the air and reduce the bloated feeling in your belly (abdomen).   No driving for 24 hours (because of the medicine (anesthesia) used during the test).   You may shower.   Do not sign any important legal documents or operate any machinery for 24 hours (because of the anesthesia used during the test).    NUTRITION  Drink plenty of fluids.   You may  resume your normal diet as instructed by your doctor.   Begin with a light meal and progress to your normal diet. Heavy or fried foods are harder to digest and may make you feel sick to your stomach (nauseated).   Avoid alcoholic beverages for 24 hours or as instructed.    MEDICATIONS  You may resume your normal medications.   WHAT YOU CAN EXPECT TODAY  Some feelings of bloating in the abdomen.   Passage of more gas than usual.    IF YOU HAD A BIOPSY TAKEN DURING THE UPPER ENDOSCOPY:  Eat a soft diet IF YOU HAVE NAUSEA, BLOATING, ABDOMINAL PAIN, OR VOMITING.    FINDING OUT THE RESULTS OF YOUR TEST Not all test results are available during your visit. DR. Darrick Penna WILL CALL YOU WITHIN 14 DAYS OF YOUR PROCEDUE WITH YOUR RESULTS. Do not assume everything is normal if you have not heard from DR. Azlaan Isidore, CALL HER OFFICE AT [phone].  SEEK IMMEDIATE MEDICAL ATTENTION AND CALL THE OFFICE: [phone] IF:  You have more than a spotting of blood in your stool.   Your belly is swollen (abdominal distention).   You are nauseated or vomiting.   You have a temperature over 101F.   You have abdominal pain or discomfort that is severe or gets worse throughout the day.   ESOPHAGEAL STRICTURE  Esophageal strictures can be caused by stomach acid backing up into the tube that carries food from the mouth down to the stomach (lower esophagus).  TREATMENT There are a  number of  medicines used to treat reflux/stricture, including: Antacids.  Proton-pump inhibitors: PREVACID PEPCID OR TAGAMET      Lifestyle and home remedies TO CONTROL REFLUX/HEARTBURN You may eliminate or reduce the frequency of heartburn by making the following lifestyle changes:  . Control your weight. Being overweight is a major risk factor for heartburn and GERD. Excess pounds put pressure on your abdomen, pushing up your stomach and causing acid to back up into your esophagus.   . Eat smaller meals. 4 TO 6  MEALS A DAY. This reduces pressure on the lower esophageal sphincter, helping to prevent the valve from opening and acid from washing back into your esophagus.   Dolphus Jenny your belt. Clothes that fit tightly around your waist put pressure on your abdomen and the lower esophageal sphincter.   . Eliminate heartburn triggers. Everyone has specific triggers. Common triggers such as fatty or fried foods, spicy food, tomato sauce, carbonated beverages, alcohol, chocolate, mint, garlic, onion, caffeine and nicotine may make heartburn worse.   Marland Kitchen Avoid stooping or bending. Tying your shoes is OK. Bending over for longer periods to weed your garden isn't, especially soon after eating.   . Don't lie down after a meal. Wait at least three to four hours after eating before going to bed, and don't lie down right after eating.    Alternative medicine . Several home remedies exist for treating GERD, but they provide only temporary relief. They include drinking baking soda (sodium bicarbonate) added to water or drinking other fluids such as baking soda mixed with cream of tartar and water.  . Although these liquids create temporary relief by neutralizing, washing away or buffering acids, eventually they aggravate the situation by adding gas and fluid to your stomach, increasing pressure and causing more acid reflux. Further, adding more sodium to your diet may increase your blood pressure and add stress to your heart, and excessive bicarbonate ingestion can alter the acid-base balance in your body.

## 2020-01-07 NOTE — Op Note (Signed)
[facility] Patient Name: Ernest Cochran Procedure Date: 01/07/2020 7:05 AM MRN: [id number] Date of Birth: [birth year] Attending MD: Barney Drain MD, MD CSN: [id number] Age: 29 Admit Type: Outpatient Procedure:                Upper GI endoscopy WITH COLD FORCEPS BIOPSY Indications:              Dysphagia, Follow-up of eosinophilic esophagitis,                            Diarrhea, Nausea with vomiting.                            DYSPHAGIA/NAUSEA/VOMITING/DIARRHEA RESOLVED WITH                            DIETARY CHANGES AND PREVACID BID.GAINING WEIGHT.                            LAST EGD/DIL OCT 2020 WITH EOE. Providers:                Barney Drain MD, MD, Jeanann Lewandowsky. Sharon Seller, RN, Aram Candela Referring MD:             Loman Brooklyn, NP- Medicines:                Propofol per Anesthesia Complications:            No immediate complications. Estimated Blood Loss:     Estimated blood loss was minimal. Procedure:                Pre-Anesthesia Assessment:                           - Prior to the procedure, a History and Physical                            was performed, and patient medications and                            allergies were reviewed. The patient's tolerance of                            previous anesthesia was also reviewed. The risks                            and benefits of the procedure and the sedation                            options and risks were discussed with the patient.                            All questions were answered, and informed consent  was obtained. Prior Anticoagulants: The patient has                            taken no previous anticoagulant or antiplatelet                            agents except for NSAID medication. ASA Grade                            Assessment: II - A patient with mild systemic                            disease. After reviewing the risks and benefits,           the patient was deemed in satisfactory condition to                            undergo the procedure. After obtaining informed                            consent, the endoscope was passed under direct                            vision. Throughout the procedure, the patient's                            blood pressure, pulse, and oxygen saturations were                            monitored continuously. The [id number]-[id number] ([id number])                            scope was introduced through the mouth, and                            advanced to the second part of duodenum. The upper                            GI endoscopy was accomplished without difficulty.                            The patient tolerated the procedure well. Scope In: 7:41:57 AM Scope Out: 7:52:46 AM Total Procedure Duration: 0 hours 10 minutes 49 seconds  Findings:      Mucosal changes including ringed esophagus and longitudinal furrows were       found in the entire esophagus. Esophageal findings were graded using the       Eosinophilic Esophagitis Endoscopic Reference Score (EoE-EREFS) as:       Edema Grade 1 Present (decreased clarity or absence of vascular       markings), Rings Grade 0 None (no ridges or rings seen), Exudates Grade       0 None (no white lesions seen), Furrows Grade 1 Present (vertical lines       with or without visible depth) and Stricture present (15 mm luminal  diameter). Biopsies were obtained from the proximal(20 CM FROM THE       INCISORS) and distal esophagus(35 CM FROM THE INCISORS) with cold       forceps for histology of suspected eosinophilic esophagitis. Biopsies       were obtained from the proximal and distal esophagus with cold forceps       for histology of suspected eosinophilic esophagitis. EGJ 40 CM FROM THE       INCISORS.      The entire examined stomach was normal.      The duodenal bulb was normal. Biopsies(2) for histology were taken with       a cold forceps for  evaluation of celiac disease OR EOD.      The second portion of the duodenum was normal. Biopsies(4) for histology       were taken with a cold forceps for evaluation of celiac disease OR EOD. Impression:               - Esophageal mucosal changes secondary to                            eosinophilic esophagitis. Biopsied. Moderate Sedation:      Per Anesthesia Care Recommendation:           - Patient has a contact number available for                            emergencies. The signs and symptoms of potential                            delayed complications were discussed with the                            patient. Return to normal activities tomorrow.                            Written discharge instructions were provided to the                            patient.                           - Low fat diet.                           - Continue present medications.                           - Await pathology results.                           - Return to GI office in 4 months. Procedure Code(s):        --- Professional ---                           [phone], Esophagogastroduodenoscopy, flexible,                            transoral; with biopsy, single or multiple  Diagnosis Code(s):        --- Professional ---                           K20.0, Eosinophilic esophagitis                           R13.10, Dysphagia, unspecified                           R19.7, Diarrhea, unspecified                           R11.2, Nausea with vomiting, unspecified CPT copyright 2019 American Medical Association. All rights reserved. The codes documented in this report are preliminary and upon coder review may  be revised to meet current compliance requirements. Jonette Eva, MD Jonette Eva MD, MD 01/07/2020 8:12:16 AM This report has been signed electronically. Number of Addenda: 0

## 2020-01-08 LAB — SURGICAL PATHOLOGY

## 2020-01-09 ENCOUNTER — Encounter: Payer: Self-pay | Admitting: Gastroenterology

## 2020-01-09 ENCOUNTER — Telehealth: Payer: Self-pay | Admitting: Gastroenterology

## 2020-01-09 MED ORDER — BUDESONIDE 0.5 MG/2ML IN SUSP: RESPIRATORY_TRACT | 2 refills | Status: DC

## 2020-01-09 NOTE — Telephone Encounter (Signed)
THE ESOPHAGEAL BIOPSIES SHOWS EOSINOPHILIC ESOPHAGITIS. START BUDESONIDE 2 MG BID FOR 12 WEEKS. SEE NUTRITION TO DISCUSS A SIX FOOD ELIMINATION DIET FOR EOSINOPHILIC ESOPHAGITIS. CONTINUE PROTONIX TWICE DAILY. FOLLOW UP IN 4 MOS E30 EOSINOPHILIC ESOPHAGITIS.

## 2020-01-10 ENCOUNTER — Telehealth: Payer: Self-pay | Admitting: Gastroenterology

## 2020-01-10 ENCOUNTER — Ambulatory Visit: Payer: Medicaid Other | Admitting: Gastroenterology

## 2020-01-10 NOTE — Telephone Encounter (Signed)
Preferred medication inhalers on ps insurance plan are Flovent HFA Inhaler and Pulmicort Respules 0.25mg , 0.5mg , 1mg . Pt was prescribed Budesonide inhaler. Can pts medication be changed? please advise.

## 2020-01-10 NOTE — Telephone Encounter (Signed)
Prescription is too expensive and insurance doesn't cover it. Doesn't know the name of medication. He uses [facility] on [address]. [phone]

## 2020-01-13 ENCOUNTER — Other Ambulatory Visit: Payer: Self-pay

## 2020-01-13 ENCOUNTER — Telehealth: Payer: Self-pay | Admitting: Gastroenterology

## 2020-01-13 DIAGNOSIS — K200 Eosinophilic esophagitis: Secondary | ICD-10-CM

## 2020-01-13 NOTE — Telephone Encounter (Signed)
Pt is aware of the diagnosis and the medication. He said the medication was going to cost him $60.00 every 14 days and he cannot afford that. He is aware Dr. Darrick Penna is off today and will be back at hospital tomorrow. He is away to continue Protonix bid and has follow up appointment on 05/08/2020 with [facility].  I am forwarding to [facility] to make the nutritional referral.  Dr. Darrick Penna, please advise on the medication.

## 2020-01-13 NOTE — Telephone Encounter (Signed)
Pt was returning a call from DS. [phone]

## 2020-01-13 NOTE — Telephone Encounter (Signed)
See result note. Referral was sent to nutrition via Epic.

## 2020-01-13 NOTE — Telephone Encounter (Signed)
See previous note, I have spoken to pt.  

## 2020-01-14 ENCOUNTER — Telehealth: Payer: Self-pay | Admitting: Gastroenterology

## 2020-01-14 MED ORDER — FLUTICASONE PROPIONATE HFA 220 MCG/ACT IN AERO: INHALATION_SPRAY | RESPIRATORY_TRACT | 2 refills | Status: DC

## 2020-01-14 MED ORDER — BUDESONIDE 0.5 MG/2ML IN SUSP: RESPIRATORY_TRACT | 2 refills | Status: DC

## 2020-01-14 NOTE — Telephone Encounter (Signed)
Pt is aware and said it is over $3000.00. Dr. Darrick Penna, please advise!

## 2020-01-14 NOTE — Telephone Encounter (Signed)
Pt is aware that medication has been changed.

## 2020-01-14 NOTE — Telephone Encounter (Signed)
LMOM for pt that Dr. Darrick Penna has sent in another prescription and to check on it and let us know.

## 2020-01-14 NOTE — Telephone Encounter (Signed)
CHANGE TO PULMICORT RESPULES 0.5 MG.

## 2020-01-14 NOTE — Telephone Encounter (Signed)
See previous note

## 2020-01-14 NOTE — Addendum Note (Signed)
Addended by: [place] on: 01/14/2020 12:21 PM   Modules accepted: Orders

## 2020-01-14 NOTE — Telephone Encounter (Signed)
PLEASE CALL PT. I SENT ANOTHER PRESCRIPTION TO SEE IF IT IS CHEAPER. HE SHOULD CALL AND LET us KNOW EITHER WAY.

## 2020-01-14 NOTE — Telephone Encounter (Signed)
-----   Message from Lavena Bullion, LPN sent at 12/08/2765  1:48 PM EST ----- Pt is aware of the results and plan. [facility] please make Nutrition Referral. Dr. Darrick Penna, pt said the medication was going to cost him $60.00 every 14 days and he cannot afford that. Please advise!

## 2020-01-14 NOTE — Telephone Encounter (Signed)
PLEASE CALL PT. I HAVE WRITTEN THE PRESCRIPTION SO HE CAN MIX THE SOLUTION HIMSELF. HE SHOULD CALL AND LET us KNOW IF THIS IS TOO EXPENSIVE.

## 2020-01-14 NOTE — Telephone Encounter (Signed)
[phone] PATIENT CALLED AND SAID THAT THE PRESCRIPTION SENT IN WAS FOR THE SAME MEDICATION, JUST DIFFERENT AMOUNT AND NOW IT IS 3000$   PLEASE CALL HIM, HE NEEDS SOMETHING COMPLETELY DIFFERENT

## 2020-02-12 ENCOUNTER — Ambulatory Visit: Payer: Medicaid Other | Admitting: Urology

## 2020-02-12 ENCOUNTER — Encounter: Payer: Self-pay | Admitting: Family Medicine

## 2020-02-12 ENCOUNTER — Ambulatory Visit (INDEPENDENT_AMBULATORY_CARE_PROVIDER_SITE_OTHER): Payer: Medicaid Other | Admitting: Family Medicine

## 2020-02-12 DIAGNOSIS — F419 Anxiety disorder, unspecified: Secondary | ICD-10-CM

## 2020-02-12 DIAGNOSIS — F3342 Major depressive disorder, recurrent, in full remission: Secondary | ICD-10-CM | POA: Diagnosis not present

## 2020-02-12 MED ORDER — HYDROXYZINE HCL 25 MG PO TABS: 25.0000 mg | ORAL_TABLET | Freq: Three times a day (TID) | ORAL | 2 refills | Status: DC | PRN

## 2020-02-12 MED ORDER — VENLAFAXINE HCL ER 37.5 MG PO CP24: 37.5000 mg | ORAL_CAPSULE | Freq: Every day | ORAL | 2 refills | Status: DC

## 2020-02-12 NOTE — Progress Notes (Signed)
Virtual Visit via Telephone Note  I connected with Ernest Cochran on 02/12/20 at 8:11 AM by telephone and verified that I am speaking with the correct person using two identifiers. Ernest Cochran is currently located at home and nobody is currently with him during this visit. The provider, Gwenlyn Fudge, FNP is located in their home at time of visit.  I discussed the limitations, risks, security and privacy concerns of performing an evaluation and management service by telephone and the availability of in person appointments. I also discussed with the patient that there may be a patient responsible charge related to this service. The patient expressed understanding and agreed to proceed.  Subjective: PCP: Gwenlyn Fudge, FNP  Chief Complaint  Patient presents with  . Depression   Patient reports he feels he is doing well with Effexor.  He feels more motivated and energetic on a daily basis.  He does feel he is more fidgety than he used to be.  Also reports when he has to go to public places like [employer] or the grocery store he gets anxious and just wants to leave.  He wonders if he just did not notice this before from where he was so depressed.  Depression screen PHQ 2/9 12/24/2019  Decreased Interest 1  Down, Depressed, Hopeless 2  PHQ - 2 Score 3  Altered sleeping 3  Tired, decreased energy 2  Change in appetite 3  Feeling bad or failure about yourself  3  Trouble concentrating 2  Moving slowly or fidgety/restless 0  Suicidal thoughts 1  PHQ-9 Score 17  Difficult doing work/chores Somewhat difficult    ROS: Per HPI  Current Outpatient Medications:  .  fluticasone (FLOVENT HFA) 220 MCG/ACT inhaler, 2 PUFFs INTO BACK OF THROAT AND THEN SWALLOW TWICE A DAY FOR 8 WEEKS., Disp: 1 Inhaler, Rfl: 2 .  ibuprofen (ADVIL) 800 MG tablet, Take 1 tablet (800 mg total) by mouth 3 (three) times daily as needed., Disp: 21 tablet, Rfl: 0 .  lansoprazole (PREVACID) 30 MG capsule, 1 po 30  mins prior to FIRST AND LAST MEAL, Disp: 60 capsule, Rfl: 5 .  ondansetron (ZOFRAN ODT) 4 MG disintegrating tablet, Take 1 tablet (4 mg total) by mouth every 8 (eight) hours as needed for nausea or vomiting., Disp: 10 tablet, Rfl: 0 .  oxyCODONE-acetaminophen (PERCOCET/ROXICET) 5-325 MG tablet, Take 1 tablet by mouth every 6 (six) hours as needed for severe pain., Disp: 10 tablet, Rfl: 0 .  venlafaxine XR (EFFEXOR XR) 37.5 MG 24 hr capsule, Take 1 capsule (37.5 mg total) by mouth daily., Disp: 30 capsule, Rfl: 2  No Known Allergies Past Medical History:  Diagnosis Date  . Asthma   . Depression   . Eosinophilic esophagitis 2020  . GERD (gastroesophageal reflux disease)   . Kidney stone     Observations/Objective: A&O  No respiratory distress or wheezing audible over the phone Mood, judgement, and thought processes all WNL  Assessment and Plan: 1. Recurrent major depressive disorder, in full remission (HCC) - Well controlled on current regimen.  - venlafaxine XR (EFFEXOR XR) 37.5 MG 24 hr capsule; Take 1 capsule (37.5 mg total) by mouth daily.  Dispense: 90 capsule; Refill: 2  2. Anxiety - Rx'd hydroxyzine for as needed use when he is out in public.  Advised to have someone go with him so that if it makes him sleepy he does not have to drive. - hydrOXYzine (ATARAX/VISTARIL) 25 MG tablet; Take 1 tablet (25  mg total) by mouth 3 (three) times daily as needed for anxiety.  Dispense: 30 tablet; Refill: 2   Follow Up Instructions: Return in about 6 months (around 08/14/2020) for annual physical.  I discussed the assessment and treatment plan with the patient. The patient was provided an opportunity to ask questions and all were answered. The patient agreed with the plan and demonstrated an understanding of the instructions.   The patient was advised to call back or seek an in-person evaluation if the symptoms worsen or if the condition fails to improve as anticipated.  The above  assessment and management plan was discussed with the patient. The patient verbalized understanding of and has agreed to the management plan. Patient is aware to call the clinic if symptoms persist or worsen. Patient is aware when to return to the clinic for a follow-up visit. Patient educated on when it is appropriate to go to the emergency department.   Time call ended: 8:19 AM  I provided 10 minutes of non-face-to-face time during this encounter.  Hendricks Limes, MSN, APRN, FNP-C [facility] 02/12/20

## 2020-05-08 ENCOUNTER — Ambulatory Visit: Payer: Medicaid Other | Admitting: Gastroenterology

## 2020-06-08 ENCOUNTER — Ambulatory Visit (INDEPENDENT_AMBULATORY_CARE_PROVIDER_SITE_OTHER): Payer: Medicaid Other

## 2020-06-08 ENCOUNTER — Ambulatory Visit
Admission: EM | Admit: 2020-06-08 | Discharge: 2020-06-08 | Disposition: A | Discharge status: Home / Self Care | Payer: Medicaid Other | Attending: Emergency Medicine | Admitting: Emergency Medicine

## 2020-06-08 ENCOUNTER — Other Ambulatory Visit: Payer: Self-pay

## 2020-06-08 ENCOUNTER — Encounter: Payer: Self-pay | Admitting: Emergency Medicine

## 2020-06-08 DIAGNOSIS — S93402A Sprain of unspecified ligament of left ankle, initial encounter: Secondary | ICD-10-CM | POA: Diagnosis not present

## 2020-06-08 DIAGNOSIS — M25572 Pain in left ankle and joints of left foot: Secondary | ICD-10-CM

## 2020-06-08 DIAGNOSIS — S99912A Unspecified injury of left ankle, initial encounter: Secondary | ICD-10-CM | POA: Diagnosis not present

## 2020-06-08 DIAGNOSIS — M7989 Other specified soft tissue disorders: Secondary | ICD-10-CM | POA: Diagnosis not present

## 2020-06-08 MED ORDER — NAPROXEN 500 MG PO TABS: 500.0000 mg | ORAL_TABLET | Freq: Two times a day (BID) | ORAL | 0 refills | Status: DC

## 2020-06-08 NOTE — ED Provider Notes (Signed)
[facility]   [id number] 06/08/20 Arrival Time: 1013  CC: LT ankle pain  SUBJECTIVE: History from: patient. Ernest Cochran is a 29 y.o. male complains of LT ankle pain/ injury x 1 day.  Stepped in hole and twisted ankle.  Localizes the pain to the outside of ankle.  Describes the pain as constant, dull and intermittently sharp in character.  Has tried OTC medications without relief.  Symptoms are made worse with walking.  Denies similar symptoms in the past.  Complains of associated swelling.  Denies fever, chills, erythema, ecchymosis, weakness, numbness and tingling.   ROS: As per HPI.  All other pertinent ROS negative.     Past Medical History:  Diagnosis Date   Asthma    Depression    Eosinophilic esophagitis 2020   GERD (gastroesophageal reflux disease)    Kidney stone    Past Surgical History:  Procedure Laterality Date   BIOPSY  01/07/2020   Procedure: BIOPSY;  Surgeon: West Bali, MD;  Location: [facility];  Service: Endoscopy;;  duodenum esophagus   ESOPHAGOGASTRODUODENOSCOPY (EGD) WITH PROPOFOL N/A 10/03/2019   Procedure: ESOPHAGOGASTRODUODENOSCOPY (EGD) WITH PROPOFOL;  Surgeon: West Bali, MD;  Location: [facility];  Service: Endoscopy;  Laterality: N/A;  8:30am   ESOPHAGOGASTRODUODENOSCOPY (EGD) WITH PROPOFOL N/A 01/07/2020   Procedure: ESOPHAGOGASTRODUODENOSCOPY (EGD) WITH PROPOFOL;  Surgeon: West Bali, MD;  [facility]: [facility];  Service: Endoscopy;  Laterality: N/A;  2:45pm   SAVORY DILATION N/A 10/03/2019   Procedure: SAVORY DILATION;  Surgeon: West Bali, MD;  Location: [facility];  Service: Endoscopy;  Laterality: N/A;  15/16/17   No Known Allergies No current facility-administered medications on file prior to encounter.   Current Outpatient Medications on File Prior to Encounter  Medication Sig Dispense Refill   fluticasone (FLOVENT HFA) 220 MCG/ACT inhaler 2 PUFFs INTO BACK OF THROAT AND THEN SWALLOW TWICE A  DAY FOR 8 WEEKS. 1 Inhaler 2   hydrOXYzine (ATARAX/VISTARIL) 25 MG tablet Take 1 tablet (25 mg total) by mouth 3 (three) times daily as needed for anxiety. 30 tablet 2   ibuprofen (ADVIL) 800 MG tablet Take 1 tablet (800 mg total) by mouth 3 (three) times daily as needed. 21 tablet 0   lansoprazole (PREVACID) 30 MG capsule 1 po 30 mins prior to FIRST AND LAST MEAL 60 capsule 5   ondansetron (ZOFRAN ODT) 4 MG disintegrating tablet Take 1 tablet (4 mg total) by mouth every 8 (eight) hours as needed for nausea or vomiting. 10 tablet 0   oxyCODONE-acetaminophen (PERCOCET/ROXICET) 5-325 MG tablet Take 1 tablet by mouth every 6 (six) hours as needed for severe pain. 10 tablet 0   venlafaxine XR (EFFEXOR XR) 37.5 MG 24 hr capsule Take 1 capsule (37.5 mg total) by mouth daily. 90 capsule 2   Social History   Socioeconomic History   Marital status: Married    Spouse name: Not on file   Number of children: Not on file   Years of education: Not on file   Highest education level: Not on file  Occupational History   Not on file  Tobacco Use   Smoking status: Former Smoker    Types: Cigarettes   Smokeless tobacco: Never Used  Vaping Use   Vaping Use: Never used  Substance and Sexual Activity   Alcohol use: Not Currently    Comment: occasional. 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    Drug use: Not Currently  Types: Marijuana    Comment: denied 07/15/19, stopped 3-4 months ago.    Sexual activity: Not on file  Other Topics Concern   Not on file  Social History Narrative   Not on file   Social Determinants of Health   Financial Resource Strain:    Difficulty of Paying Living Expenses:   Food Insecurity:    Worried About Running Out of Food in the Last Year:    Barista in the Last Year:   Transportation Needs:    Freight forwarder (Medical):    Lack of Transportation (Non-Medical):   Physical Activity:    Days of Exercise per Week:     Minutes of Exercise per Session:   Stress:    Feeling of Stress :   Social Connections:    Frequency of Communication with Friends and Family:    Frequency of Social Gatherings with Friends and Family:    Attends Religious Services:    Active Member of Clubs or Organizations:    Attends Engineer, structural:    Marital Status:   Intimate Partner Violence:    Fear of Current or Ex-Partner:    Emotionally Abused:    Physically Abused:    Sexually Abused:    Family History  Problem Relation Age of Onset   Colon cancer Maternal Grandfather    Diabetes Maternal Grandfather    Heart disease Maternal Grandfather    Lung cancer Maternal Grandmother    Asthma Son    Colon polyps Neg Hx     OBJECTIVE:  Vitals:   06/08/20 1022 06/08/20 1023  BP:  (!) 123/55  Pulse:  67  Resp:  17  Temp:  98.7 F (37.1 C)  TempSrc:  Oral  SpO2:  96%  Weight: 250 lb (113.4 kg)   Height: 5\' 10"  (1.778 m)     General appearance: ALERT; in no acute distress.  Head: NCAT Lungs: Normal respiratory effort CV: Dorsalis pedis pulse 2+. Cap refill < 2 seconds Musculoskeletal: LT ankle Inspection: Swelling over lateral ankle Palpation: TTP over lateral ankle/ distal fibula ROM: LROM about the ankle Strength: 4+/5 dorsiflexion, 4+/5 plantar flexion Skin: warm and dry Neurologic: Ambulates with antalgic gait; Sensation intact about the lower extremities Psychological: alert and cooperative; normal mood and affect  DIAGNOSTIC STUDIES:  DG Ankle Complete Left  Result Date: 06/08/2020 CLINICAL DATA:  Injury to the left ankle yesterday with pain. EXAM: LEFT ANKLE COMPLETE - 3+ VIEW COMPARISON:  September 01, 2005 FINDINGS: There is no evidence of fracture, dislocation, or joint effusion. There is no evidence of arthropathy or other focal bone abnormality. Mild soft tissue swelling over lateral left ankle is identified. IMPRESSION: No acute fracture or dislocation. Mild soft  tissue swelling over lateral left ankle. Electronically Signed   By: September 03, 2005 M.D.   On: 06/08/2020 10:56    X-rays negative for bony abnormalities including fracture, or dislocation.  No soft tissue swelling.    I have reviewed the x-rays myself and the radiologist interpretation. I am in agreement with the radiologist interpretation.     ASSESSMENT & PLAN:  1. Acute left ankle pain   2. Sprain of left ankle, unspecified ligament, initial encounter    Meds ordered this encounter  Medications   naproxen (NAPROSYN) 500 MG tablet    Sig: Take 1 tablet (500 mg total) by mouth 2 (two) times daily.    Dispense:  30 tablet    Refill:  0  Order Specific Question:   Supervising Provider    Answer:   Eustace Moore [[id number]]   X-rays negative for fracture or dislocation Continue conservative management of rest, ice, and elevation Cam walker placed Take naproxen as needed for pain relief (may cause abdominal discomfort, ulcers, and GI bleeds avoid taking with other NSAIDs) Follow up with orthopedist as needed Return or go to the ER if you have any new or worsening symptoms (fever, chills, chest pain, redness, swelling, bruising, worsening symptoms despite treatment, etc...)    Reviewed expectations re: course of current medical issues. Questions answered. Outlined signs and symptoms indicating need for more acute intervention. Patient verbalized understanding. After Visit Summary given.    Rennis Harding, PA-C 06/08/20 1113

## 2020-06-08 NOTE — ED Triage Notes (Signed)
Stepped in a hole yesterday and heard a crunch to his LT ankle

## 2020-06-08 NOTE — Discharge Instructions (Signed)
X-rays negative for fracture or dislocation Continue conservative management of rest, ice, and elevation Cam walker placed Take naproxen as needed for pain relief (may cause abdominal discomfort, ulcers, and GI bleeds avoid taking with other NSAIDs) Follow up with orthopedist as needed Return or go to the ER if you have any new or worsening symptoms (fever, chills, chest pain, redness, swelling, bruising, worsening symptoms despite treatment, etc...)

## 2020-06-14 ENCOUNTER — Encounter: Payer: Self-pay | Admitting: Gastroenterology

## 2020-06-14 NOTE — Progress Notes (Deleted)
Referring Provider: Laverle Hobby, MD Primary Care Physician:  Gwenlyn Fudge, FNP Primary GI Physician: Dr. Bonnetta Barry chief complaint on file.   HPI:   Ernest Cochran is a 29 y.o. male presenting today for follow-up s/p EGD.   Patient was last seen in our office at the time of initial consult on 07/15/2019 for dyspepsia, dysphagia, and frequent stools.  He noted new onset of abdominal pain, diarrhea, mild nausea, few isolated episodes of vomiting around July 2020.  H. pylori blood test was positive.  Completed Prevpac and continue Prevacid twice daily with no significant change in symptoms.  Primarily, pain periumbilically and LUQ.  Positive GERD symptoms which was chronic.  Prevacid working well.  Solid food dysphagia for a while.  Typically, BMs are postprandial with 3-5 a day.  Occasional abdominal pain prior to and sometimes after.  Occasionally pain resolves after BMs but not always.  No rectal bleeding.  Aleve and ibuprofen about twice a week for headaches.  Patient declined supportive measures for frequent stools.  Consider stool studies if he develop diarrhea.  Plans to pursue EGD for dysphagia and dyspepsia.  Differentials include gastritis, PUD, possible IBS.  EGD 10/03/2019: LA grade B esophagitis s/p biopsy and dilation, mild gastritis s/p biopsy, and mild duodenitis.  Stomach biopsy with mild chronic inflammation negative for H. pylori.  Esophageal biopsy with inflamed squamous mucosa with increased eosinophils consistent with eosinophilic esophagitis.  Recommended continuing Prevacid twice daily and repeat EGD in 3 months.  If persistent EOE, consider seeing allergist and additional medications to resolve EOE. EGD 01/07/2020: Esophageal mucosal changes secondary to eosinophilic esophagitis s/p biopsy, normal stomach, normal examined duodenum s/p biopsied.  Esophageal biopsies with increased intraepithelial eosinophils.  Duodenal bulb biopsy with peptic duodenitis, second part of  duodenum with benign small bowel mucosa.  He was advised to continue PPI twice daily, see nutrition to discuss 6 elimination diet for EOE, start budesonide 2 mg twice daily x12 weeks.  Due to budesonide being too expensive, fluticasone was sent to pharmacy with instructions for 2 puffs into back of throat and then swallow twice a day for 8 weeks.  Today:  Past Medical History:  Diagnosis Date  . Asthma   . Depression   . Eosinophilic esophagitis 2020  . GERD (gastroesophageal reflux disease)   . Kidney stone     Past Surgical History:  Procedure Laterality Date  . BIOPSY  01/07/2020   Procedure: BIOPSY;  Surgeon: West Bali, MD;  [facility];  Service: Endoscopy;;  duodenum esophagus  . ESOPHAGOGASTRODUODENOSCOPY (EGD) WITH PROPOFOL N/A 10/03/2019   Procedure: ESOPHAGOGASTRODUODENOSCOPY (EGD) WITH PROPOFOL;  Surgeon: West Bali, MD;  [facility]: [facility];  Service: Endoscopy;  Laterality: N/A;  8:30am  . ESOPHAGOGASTRODUODENOSCOPY (EGD) WITH PROPOFOL N/A 01/07/2020   Procedure: ESOPHAGOGASTRODUODENOSCOPY (EGD) WITH PROPOFOL;  Surgeon: West Bali, MD;  [facility];  Service: Endoscopy;  Laterality: N/A;  2:45pm  . SAVORY DILATION N/A 10/03/2019   Procedure: SAVORY DILATION;  Surgeon: West Bali, MD;  [facility]: [facility];  Service: Endoscopy;  Laterality: N/A;  15/16/17    Current Outpatient Medications  Medication Sig Dispense Refill  . fluticasone (FLOVENT HFA) 220 MCG/ACT inhaler 2 PUFFs INTO BACK OF THROAT AND THEN SWALLOW TWICE A DAY FOR 8 WEEKS. 1 Inhaler 2  . hydrOXYzine (ATARAX/VISTARIL) 25 MG tablet Take 1 tablet (25 mg total) by mouth 3 (three) times daily as needed for anxiety. 30  tablet 2  . ibuprofen (ADVIL) 800 MG tablet Take 1 tablet (800 mg total) by mouth 3 (three) times daily as needed. 21 tablet 0  . lansoprazole (PREVACID) 30 MG capsule 1 po 30 mins prior to FIRST AND LAST MEAL 60 capsule 5  . naproxen (NAPROSYN) 500  MG tablet Take 1 tablet (500 mg total) by mouth 2 (two) times daily. 30 tablet 0  . ondansetron (ZOFRAN ODT) 4 MG disintegrating tablet Take 1 tablet (4 mg total) by mouth every 8 (eight) hours as needed for nausea or vomiting. 10 tablet 0  . oxyCODONE-acetaminophen (PERCOCET/ROXICET) 5-325 MG tablet Take 1 tablet by mouth every 6 (six) hours as needed for severe pain. 10 tablet 0  . venlafaxine XR (EFFEXOR XR) 37.5 MG 24 hr capsule Take 1 capsule (37.5 mg total) by mouth daily. 90 capsule 2   No current facility-administered medications for this visit.    Allergies as of 06/15/2020  . (No Known Allergies)    Family History  Problem Relation Age of Onset  . Colon cancer Maternal Grandfather   . Diabetes Maternal Grandfather   . Heart disease Maternal Grandfather   . Lung cancer Maternal Grandmother   . Asthma Son   . Colon polyps Neg Hx     Social History   Socioeconomic History  . Marital status: Married    Spouse name: Not on file  . Number of children: Not on file  . Years of education: Not on file  . Highest education level: Not on file  Occupational History  . Not on file  Tobacco Use  . Smoking status: Former Smoker    Types: Cigarettes  . Smokeless tobacco: Never Used  Vaping Use  . Vaping Use: Never used  Substance and Sexual Activity  . Alcohol use: Not Currently    Comment: occasional. 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   . Drug use: Not Currently    Types: Marijuana    Comment: denied 07/15/19, stopped 3-4 months ago.   Marland Kitchen Sexual activity: Not on file  Other Topics Concern  . Not on file  Social History Narrative  . Not on file   Social Determinants of Health   Financial Resource Strain:   . Difficulty of Paying Living Expenses:   Food Insecurity:   . Worried About Programme researcher, broadcasting/film/video in the Last Year:   . Barista in the Last Year:   Transportation Needs:   . Freight forwarder (Medical):   Marland Kitchen Lack of Transportation  (Non-Medical):   Physical Activity:   . Days of Exercise per Week:   . Minutes of Exercise per Session:   Stress:   . Feeling of Stress :   Social Connections:   . Frequency of Communication with Friends and Family:   . Frequency of Social Gatherings with Friends and Family:   . Attends Religious Services:   . Active Member of Clubs or Organizations:   . Attends Banker Meetings:   Marland Kitchen Marital Status:     Review of Systems: Gen: Denies fever, chills, anorexia. Denies fatigue, weakness, weight loss.  CV: Denies chest pain, palpitations, syncope, peripheral edema, and claudication. Resp: Denies dyspnea at rest, cough, wheezing, coughing up blood, and pleurisy. GI: Denies vomiting blood, jaundice, and fecal incontinence.   Denies dysphagia or odynophagia. Derm: Denies rash, itching, dry skin Psych: Denies depression, anxiety, memory loss, confusion. No homicidal or suicidal ideation.  Heme: Denies bruising,  bleeding, and enlarged lymph nodes.  Physical Exam: There were no vitals taken for this visit. General:   Alert and oriented. No distress noted. Pleasant and cooperative.  Head:  Normocephalic and atraumatic. Eyes:  Conjuctiva clear without scleral icterus. Mouth:  Oral mucosa pink and moist. Good dentition. No lesions. Heart:  S1, S2 present without murmurs appreciated. Lungs:  Clear to auscultation bilaterally. No wheezes, rales, or rhonchi. No distress.  Abdomen:  +BS, soft, non-tender and non-distended. No rebound or guarding. No HSM or masses noted. Msk:  Symmetrical without gross deformities. Normal posture. Extremities:  Without edema. Neurologic:  Alert and  oriented x4 Psych:  Alert and cooperative. Normal mood and affect.

## 2020-06-15 ENCOUNTER — Ambulatory Visit: Payer: Medicaid Other | Admitting: Gastroenterology

## 2020-06-15 ENCOUNTER — Encounter: Payer: Self-pay | Admitting: Gastroenterology

## 2020-08-24 ENCOUNTER — Ambulatory Visit
Admission: EM | Admit: 2020-08-24 | Discharge: 2020-08-24 | Disposition: A | Discharge status: Home / Self Care | Payer: Medicaid Other | Attending: Emergency Medicine | Admitting: Emergency Medicine

## 2020-08-24 ENCOUNTER — Other Ambulatory Visit: Payer: Self-pay

## 2020-08-24 DIAGNOSIS — Z1152 Encounter for screening for COVID-19: Secondary | ICD-10-CM | POA: Diagnosis not present

## 2020-08-24 NOTE — ED Triage Notes (Signed)
Pt symptoms resolved needs test for work

## 2020-08-28 LAB — NOVEL CORONAVIRUS, NAA: SARS-CoV-2, NAA: NOT DETECTED

## 2020-10-04 ENCOUNTER — Other Ambulatory Visit: Payer: Self-pay

## 2020-10-04 ENCOUNTER — Ambulatory Visit
Admission: EM | Admit: 2020-10-04 | Discharge: 2020-10-04 | Disposition: A | Discharge status: Home / Self Care | Payer: Medicaid Other | Attending: Emergency Medicine | Admitting: Emergency Medicine

## 2020-10-04 DIAGNOSIS — J069 Acute upper respiratory infection, unspecified: Secondary | ICD-10-CM

## 2020-10-04 DIAGNOSIS — Z1152 Encounter for screening for COVID-19: Secondary | ICD-10-CM | POA: Diagnosis not present

## 2020-10-04 MED ORDER — BENZONATATE 100 MG PO CAPS: 100.0000 mg | ORAL_CAPSULE | Freq: Three times a day (TID) | ORAL | 0 refills | Status: DC

## 2020-10-04 NOTE — ED Provider Notes (Signed)
[facility]   [id number] 10/04/20 Arrival Time: 1350   CC: COVID symptoms  SUBJECTIVE: History from: patient.  Ernest Cochran is a 29 y.o. male who presents with productive cough and sore throat x 3 days.  Denies sick exposure to COVID, flu or strep.  Father with lung cancer and PNA.  Has tried OTC medications without relief.  Symptoms are made worse at night.  Denies previous COVID infection in the past.   Denies fever, chills, SOB, wheezing, chest pain, nausea, changes in bowel or bladder habits.    ROS: As per HPI.  All other pertinent ROS negative.     Past Medical History:  Diagnosis Date   Asthma    Depression    Eosinophilic esophagitis 2020   GERD (gastroesophageal reflux disease)    Kidney stone    Past Surgical History:  Procedure Laterality Date   BIOPSY  01/07/2020   Procedure: BIOPSY;  Surgeon: West Bali, MD;  [facility]: [facility];  Service: Endoscopy;;  duodenum esophagus   ESOPHAGOGASTRODUODENOSCOPY (EGD) WITH PROPOFOL N/A 10/03/2019   Procedure: ESOPHAGOGASTRODUODENOSCOPY (EGD) WITH PROPOFOL;  Surgeon: West Bali, MD; LA grade B esophagitis s/p biopsy and dilation, mild gastritis s/p biopsy, and mild duodenitis.  Stomach biopsy with mild chronic inflammation negative for H. pylori.  Esophageal biopsy with inflamed squamous mucosa with increased eosinophils consistent with eosinophilic esophagitis.    ESOPHAGOGASTRODUODENOSCOPY (EGD) WITH PROPOFOL N/A 01/07/2020   Procedure: ESOPHAGOGASTRODUODENOSCOPY (EGD) WITH PROPOFOL;  Surgeon: West Bali, MD;  Esophageal mucosal changes secondary to eosinophilic esophagitis s/p biopsy, normal stomach, normal examined duodenum s/p biopsied.  Esophageal biopsies with increased intraepithelial eosinophils.  Duodenal bulb biopsy with peptic duodenitis, second part of duodenum with benign small bowel mucosa.    SAVORY DILATION N/A 10/03/2019   Procedure: SAVORY DILATION;  Surgeon: West Bali,  MD;  Location: [facility];  Service: Endoscopy;  Laterality: N/A;  15/16/17   No Known Allergies No current facility-administered medications on file prior to encounter.   Current Outpatient Medications on File Prior to Encounter  Medication Sig Dispense Refill   fluticasone (FLOVENT HFA) 220 MCG/ACT inhaler 2 PUFFs INTO BACK OF THROAT AND THEN SWALLOW TWICE A DAY FOR 8 WEEKS. 1 Inhaler 2   hydrOXYzine (ATARAX/VISTARIL) 25 MG tablet Take 1 tablet (25 mg total) by mouth 3 (three) times daily as needed for anxiety. 30 tablet 2   ibuprofen (ADVIL) 800 MG tablet Take 1 tablet (800 mg total) by mouth 3 (three) times daily as needed. 21 tablet 0   lansoprazole (PREVACID) 30 MG capsule 1 po 30 mins prior to FIRST AND LAST MEAL 60 capsule 5   naproxen (NAPROSYN) 500 MG tablet Take 1 tablet (500 mg total) by mouth 2 (two) times daily. 30 tablet 0   ondansetron (ZOFRAN ODT) 4 MG disintegrating tablet Take 1 tablet (4 mg total) by mouth every 8 (eight) hours as needed for nausea or vomiting. 10 tablet 0   oxyCODONE-acetaminophen (PERCOCET/ROXICET) 5-325 MG tablet Take 1 tablet by mouth every 6 (six) hours as needed for severe pain. 10 tablet 0   venlafaxine XR (EFFEXOR XR) 37.5 MG 24 hr capsule Take 1 capsule (37.5 mg total) by mouth daily. 90 capsule 2   Social History   Socioeconomic History   Marital status: Married    Spouse name: Not on file   Number of children: Not on file   Years of education: Not on file   Highest education level: Not on  file  Occupational History   Not on file  Tobacco Use   Smoking status: Former Smoker    Types: Cigarettes   Smokeless tobacco: Never Used  Building services engineer Use: Never used  Substance and Sexual Activity   Alcohol use: Not Currently    Comment: occasional. 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    Drug use: Not Currently    Types: Marijuana    Comment: denied 07/15/19, stopped 3-4 months ago.    Sexual  activity: Not on file  Other Topics Concern   Not on file  Social History Narrative   Not on file   Social Determinants of Health   Financial Resource Strain:    Difficulty of Paying Living Expenses: Not on file  Food Insecurity:    Worried About Running Out of Food in the Last Year: Not on file   Ran Out of Food in the Last Year: Not on file  Transportation Needs:    Lack of Transportation (Medical): Not on file   Lack of Transportation (Non-Medical): Not on file  Physical Activity:    Days of Exercise per Week: Not on file   Minutes of Exercise per Session: Not on file  Stress:    Feeling of Stress : Not on file  Social Connections:    Frequency of Communication with Friends and Family: Not on file   Frequency of Social Gatherings with Friends and Family: Not on file   Attends Religious Services: Not on file   Active Member of Clubs or Organizations: Not on file   Attends Banker Meetings: Not on file   Marital Status: Not on file  Intimate Partner Violence:    Fear of Current or Ex-Partner: Not on file   Emotionally Abused: Not on file   Physically Abused: Not on file   Sexually Abused: Not on file   Family History  Problem Relation Age of Onset   Colon cancer Maternal Grandfather    Diabetes Maternal Grandfather    Heart disease Maternal Grandfather    Lung cancer Maternal Grandmother    Asthma Son    Colon polyps Neg Hx     OBJECTIVE:  Vitals:   10/04/20 1415  BP: 123/79  Pulse: 81  Resp: 18  Temp: 98 F (36.7 C)  SpO2: 96%    General appearance: alert; appears mildly fatigued, but nontoxic; speaking in full sentences and tolerating own secretions HEENT: NCAT; Ears: EACs clear, TMs pearly gray; Eyes: PERRL.  EOM grossly intact. Nose: nares patent without rhinorrhea, Throat: oropharynx clear, tonsils non erythematous or enlarged, uvula midline  Neck: supple without LAD Lungs: unlabored respirations, symmetrical air  entry; cough: mild; no respiratory distress; CTAB Heart: regular rate and rhythm.   Skin: warm and dry Psychological: alert and cooperative; normal mood and affect  ASSESSMENT & PLAN:  1. Encounter for screening for COVID-19   2. Viral URI with cough     Meds ordered this encounter  Medications   benzonatate (TESSALON) 100 MG capsule    Sig: Take 1 capsule (100 mg total) by mouth every 8 (eight) hours.    Dispense:  21 capsule    Refill:  0    Order Specific Question:   Supervising Provider    Answer:   Eustace Moore [[id number]]   COVID testing ordered.  It will take between 5-7 days for test results.  Someone will contact you regarding abnormal results.    In  the meantime: You should remain isolated in your home for 10 days from symptom onset AND greater than 72 hours after symptoms resolution (absence of fever without the use of fever-reducing medication and improvement in respiratory symptoms), whichever is longer Get plenty of rest and push fluids Tessalon Perles prescribed for cough Use OTC zyrtec for nasal congestion, runny nose, and/or sore throat Use OTC flonase for nasal congestion and runny nose Use medications daily for symptom relief Use OTC medications like ibuprofen or tylenol as needed fever or pain Call or go to the ED if you have any new or worsening symptoms such as fever, worsening cough, shortness of breath, chest tightness, chest pain, turning blue, changes in mental status, etc...   Reviewed expectations re: course of current medical issues. Questions answered. Outlined signs and symptoms indicating need for more acute intervention. Patient verbalized understanding. After Visit Summary given.         Rennis Harding, PA-C 10/04/20 1447

## 2020-10-04 NOTE — ED Triage Notes (Signed)
Pt presents with c/o productive cough for past couple of days, has chest pain with cough

## 2020-10-04 NOTE — Discharge Instructions (Signed)

## 2020-10-05 LAB — SARS-COV-2, NAA 2 DAY TAT

## 2020-10-05 LAB — NOVEL CORONAVIRUS, NAA: SARS-CoV-2, NAA: NOT DETECTED

## 2020-12-03 ENCOUNTER — Ambulatory Visit
Admission: EM | Admit: 2020-12-03 | Discharge: 2020-12-03 | Disposition: A | Discharge status: Home / Self Care | Payer: BLUE CROSS/BLUE SHIELD | Attending: Family Medicine | Admitting: Family Medicine

## 2020-12-03 ENCOUNTER — Other Ambulatory Visit: Payer: Self-pay

## 2020-12-03 DIAGNOSIS — R059 Cough, unspecified: Secondary | ICD-10-CM

## 2020-12-03 DIAGNOSIS — R5383 Other fatigue: Secondary | ICD-10-CM

## 2020-12-03 DIAGNOSIS — B349 Viral infection, unspecified: Secondary | ICD-10-CM | POA: Diagnosis not present

## 2020-12-03 DIAGNOSIS — R52 Pain, unspecified: Secondary | ICD-10-CM

## 2020-12-03 DIAGNOSIS — R0981 Nasal congestion: Secondary | ICD-10-CM

## 2020-12-03 DIAGNOSIS — R519 Headache, unspecified: Secondary | ICD-10-CM

## 2020-12-03 MED ORDER — PROMETHAZINE-DM 6.25-15 MG/5ML PO SYRP: 5.0000 mL | ORAL_SOLUTION | Freq: Four times a day (QID) | ORAL | 0 refills | Status: DC | PRN

## 2020-12-03 NOTE — ED Provider Notes (Signed)
[facility]   [id number] 12/03/20 Arrival Time: 1240   CC: COVID symptoms  SUBJECTIVE: History from: patient.  Ernest Cochran is a 29 y.o. male who presents with abrupt onset of nasal congestion, PND, headache, body aches, cough for the last 2-3 days. Denies sick exposure to COVID, flu or strep. Denies recent travel. Has negative history of Covid. Has not completed Covid vaccines. Has not taken OTC medications for this. There are no aggravating or alleviating factors. Denies previous symptoms in the past. Denies fever, chills, fatigue, sinus pain, rhinorrhea, sore throat, SOB, wheezing, chest pain, nausea, changes in bowel or bladder habits.    ROS: As per HPI.  All other pertinent ROS negative.     Past Medical History:  Diagnosis Date  . Asthma   . Depression   . Eosinophilic esophagitis 2020  . GERD (gastroesophageal reflux disease)   . Kidney stone    Past Surgical History:  Procedure Laterality Date  . BIOPSY  01/07/2020   Procedure: BIOPSY;  Surgeon: West Bali, MD;  [facility]: [facility];  Service: Endoscopy;;  duodenum esophagus  . ESOPHAGOGASTRODUODENOSCOPY (EGD) WITH PROPOFOL N/A 10/03/2019   Procedure: ESOPHAGOGASTRODUODENOSCOPY (EGD) WITH PROPOFOL;  Surgeon: West Bali, MD; LA grade B esophagitis s/p biopsy and dilation, mild gastritis s/p biopsy, and mild duodenitis.  Stomach biopsy with mild chronic inflammation negative for H. pylori.  Esophageal biopsy with inflamed squamous mucosa with increased eosinophils consistent with eosinophilic esophagitis.   Marland Kitchen ESOPHAGOGASTRODUODENOSCOPY (EGD) WITH PROPOFOL N/A 01/07/2020   Procedure: ESOPHAGOGASTRODUODENOSCOPY (EGD) WITH PROPOFOL;  Surgeon: West Bali, MD;  Esophageal mucosal changes secondary to eosinophilic esophagitis s/p biopsy, normal stomach, normal examined duodenum s/p biopsied.  Esophageal biopsies with increased intraepithelial eosinophils.  Duodenal bulb biopsy with peptic duodenitis,  second part of duodenum with benign small bowel mucosa.   Marland Kitchen SAVORY DILATION N/A 10/03/2019   Procedure: SAVORY DILATION;  Surgeon: West Bali, MD;  Location: [facility];  Service: Endoscopy;  Laterality: N/A;  15/16/17   No Known Allergies No current facility-administered medications on file prior to encounter.   Current Outpatient Medications on File Prior to Encounter  Medication Sig Dispense Refill  . benzonatate (TESSALON) 100 MG capsule Take 1 capsule (100 mg total) by mouth every 8 (eight) hours. 21 capsule 0  . fluticasone (FLOVENT HFA) 220 MCG/ACT inhaler 2 PUFFs INTO BACK OF THROAT AND THEN SWALLOW TWICE A DAY FOR 8 WEEKS. 1 Inhaler 2  . ibuprofen (ADVIL) 800 MG tablet Take 1 tablet (800 mg total) by mouth 3 (three) times daily as needed. 21 tablet 0  . naproxen (NAPROSYN) 500 MG tablet Take 1 tablet (500 mg total) by mouth 2 (two) times daily. 30 tablet 0  . hydrOXYzine (ATARAX/VISTARIL) 25 MG tablet Take 1 tablet (25 mg total) by mouth 3 (three) times daily as needed for anxiety. 30 tablet 2  . lansoprazole (PREVACID) 30 MG capsule 1 po 30 mins prior to FIRST AND LAST MEAL 60 capsule 5  . ondansetron (ZOFRAN ODT) 4 MG disintegrating tablet Take 1 tablet (4 mg total) by mouth every 8 (eight) hours as needed for nausea or vomiting. 10 tablet 0  . oxyCODONE-acetaminophen (PERCOCET/ROXICET) 5-325 MG tablet Take 1 tablet by mouth every 6 (six) hours as needed for severe pain. 10 tablet 0  . venlafaxine XR (EFFEXOR XR) 37.5 MG 24 hr capsule Take 1 capsule (37.5 mg total) by mouth daily. 90 capsule 2   Social History   Socioeconomic History  .  Marital status: Married    Spouse name: Not on file  . Number of children: Not on file  . Years of education: Not on file  . Highest education level: Not on file  Occupational History  . Not on file  Tobacco Use  . Smoking status: Former Smoker    Types: Cigarettes  . Smokeless tobacco: Never Used  Vaping Use  . Vaping Use: Never  used  Substance and Sexual Activity  . Alcohol use: Not Currently    Comment: occasional. 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   . Drug use: Not Currently    Types: Marijuana    Comment: denied 07/15/19, stopped 3-4 months ago.   Marland Kitchen Sexual activity: Not on file  Other Topics Concern  . Not on file  Social History Narrative  . Not on file   Social Determinants of Health   Financial Resource Strain: Not on file  Food Insecurity: Not on file  Transportation Needs: Not on file  Physical Activity: Not on file  Stress: Not on file  Social Connections: Not on file  Intimate Partner Violence: Not on file   Family History  Problem Relation Age of Onset  . Colon cancer Maternal Grandfather   . Diabetes Maternal Grandfather   . Heart disease Maternal Grandfather   . Lung cancer Maternal Grandmother   . Asthma Son   . Colon polyps Neg Hx     OBJECTIVE:  Vitals:   12/03/20 1350 12/03/20 1352  BP:  128/77  Pulse:  80  Temp:  98.3 F (36.8 C)  TempSrc:  Oral  SpO2:  97%  Weight: 247 lb (112 kg)   Height: 5\' 10"  (1.778 m)      General appearance: alert; appears fatigued, but nontoxic; speaking in full sentences and tolerating own secretions HEENT: NCAT; Ears: EACs clear, TMs pearly gray; Eyes: PERRL.  EOM grossly intact. Sinuses: nontender; Nose: nares patent without rhinorrhea, Throat: oropharynx erythematous, cobblestoning present, tonsils non erythematous or enlarged, uvula midline  Neck: supple without LAD Lungs: unlabored respirations, symmetrical air entry; cough: mild; no respiratory distress; CTAB Heart: regular rate and rhythm.  Radial pulses 2+ symmetrical bilaterally Skin: warm and dry Psychological: alert and cooperative; normal mood and affect  LABS:  No results found for this or any previous visit (from the past 24 hour(s)).   ASSESSMENT & PLAN:  1. Viral illness   2. Acute intractable headache, unspecified headache type   3. Cough   4.  Nasal congestion   5. Body aches   6. Other fatigue     Meds ordered this encounter  Medications  . promethazine-dextromethorphan (PROMETHAZINE-DM) 6.25-15 MG/5ML syrup    Sig: Take 5 mLs by mouth 4 (four) times daily as needed for cough.    Dispense:  118 mL    Refill:  0    Order Specific Question:   Supervising Provider    Answer:   02-06-1993 Merrilee Jansky   Promethazine cough syrup prescribed Sedation precautions given Continue supportive care at home COVID and flu testing ordered.  It will take between 1-2 days for test results.  Someone will contact you regarding abnormal results.   Work note provided Patient should remain in quarantine until they have received Covid results.  If negative you may resume normal activities (go back to work/school) while practicing hand hygiene, social distance, and mask wearing.  If positive, patient should remain in quarantine for 10 days from symptom onset AND greater  than 72 hours after symptoms resolution (absence of fever without the use of fever-reducing medication and improvement in respiratory symptoms), whichever is longer Get plenty of rest and push fluids Use OTC zyrtec for nasal congestion, runny nose, and/or sore throat Use OTC flonase for nasal congestion and runny nose Use medications daily for symptom relief Use OTC medications like ibuprofen or tylenol as needed fever or pain Call or go to the ED if you have any new or worsening symptoms such as fever, worsening cough, shortness of breath, chest tightness, chest pain, turning blue, changes in mental status.  Reviewed expectations re: course of current medical issues. Questions answered. Outlined signs and symptoms indicating need for more acute intervention. Patient verbalized understanding. After Visit Summary given.         Moshe Cipro, NP 12/06/20 1057

## 2020-12-03 NOTE — Discharge Instructions (Addendum)
I have sent in cough syrup for you to take. This medication can make you sleepy. Do not drive while taking this medication.  Your COVID and Flu tests are pending.  You should self quarantine until the test results are back.    Take Tylenol or ibuprofen as needed for fever or discomfort.  Rest and keep yourself hydrated.    Follow-up with your primary care provider if your symptoms are not improving.     

## 2020-12-03 NOTE — ED Triage Notes (Signed)
Pt states that he has a headache, body aches, cough and nasal congestion. Everything but cough started x2-3 days ago. Pt states that he is not vaccinated.

## 2020-12-05 LAB — NOVEL CORONAVIRUS, NAA: SARS-CoV-2, NAA: NOT DETECTED

## 2020-12-05 LAB — SARS-COV-2, NAA 2 DAY TAT

## 2020-12-09 ENCOUNTER — Ambulatory Visit: Payer: Medicaid Other | Admitting: Nurse Practitioner

## 2021-02-12 ENCOUNTER — Encounter (HOSPITAL_COMMUNITY): Payer: Self-pay | Admitting: *Deleted

## 2021-02-12 ENCOUNTER — Emergency Department (HOSPITAL_COMMUNITY)
Admission: EM | Admit: 2021-02-12 | Discharge: 2021-02-12 | Disposition: A | Discharge status: Home / Self Care | Payer: Medicaid Other | Attending: Emergency Medicine | Admitting: Emergency Medicine

## 2021-02-12 DIAGNOSIS — Z7951 Long term (current) use of inhaled steroids: Secondary | ICD-10-CM | POA: Insufficient documentation

## 2021-02-12 DIAGNOSIS — J45909 Unspecified asthma, uncomplicated: Secondary | ICD-10-CM | POA: Insufficient documentation

## 2021-02-12 DIAGNOSIS — Y990 Civilian activity done for income or pay: Secondary | ICD-10-CM | POA: Insufficient documentation

## 2021-02-12 DIAGNOSIS — X500XXA Overexertion from strenuous movement or load, initial encounter: Secondary | ICD-10-CM | POA: Insufficient documentation

## 2021-02-12 DIAGNOSIS — M545 Low back pain, unspecified: Secondary | ICD-10-CM | POA: Diagnosis not present

## 2021-02-12 DIAGNOSIS — M5440 Lumbago with sciatica, unspecified side: Secondary | ICD-10-CM | POA: Diagnosis not present

## 2021-02-12 DIAGNOSIS — Z87891 Personal history of nicotine dependence: Secondary | ICD-10-CM | POA: Diagnosis not present

## 2021-02-12 DIAGNOSIS — Y9289 Other specified places as the place of occurrence of the external cause: Secondary | ICD-10-CM | POA: Insufficient documentation

## 2021-02-12 DIAGNOSIS — Y9389 Activity, other specified: Secondary | ICD-10-CM | POA: Diagnosis not present

## 2021-02-12 DIAGNOSIS — M5432 Sciatica, left side: Secondary | ICD-10-CM | POA: Diagnosis not present

## 2021-02-12 DIAGNOSIS — M5431 Sciatica, right side: Secondary | ICD-10-CM | POA: Diagnosis not present

## 2021-02-12 DIAGNOSIS — M5430 Sciatica, unspecified side: Secondary | ICD-10-CM

## 2021-02-12 DIAGNOSIS — M549 Dorsalgia, unspecified: Secondary | ICD-10-CM | POA: Diagnosis not present

## 2021-02-12 MED ORDER — MORPHINE SULFATE (PF) 4 MG/ML IV SOLN
4.0000 mg | Freq: Once | INTRAVENOUS | Status: AC
  Administered 2021-02-12: 4 mg via INTRAMUSCULAR
  Filled 2021-02-12: qty 1

## 2021-02-12 MED ORDER — IBUPROFEN 600 MG PO TABS: 600.0000 mg | ORAL_TABLET | Freq: Four times a day (QID) | ORAL | 0 refills | Status: AC | PRN

## 2021-02-12 MED ORDER — DEXAMETHASONE SODIUM PHOSPHATE 10 MG/ML IJ SOLN
10.0000 mg | Freq: Once | INTRAMUSCULAR | Status: AC
  Administered 2021-02-12: 10 mg via INTRAMUSCULAR
  Filled 2021-02-12: qty 1

## 2021-02-12 MED ORDER — PREDNISONE 10 MG (21) PO TBPK: ORAL_TABLET | ORAL | 0 refills | Status: DC

## 2021-02-12 MED ORDER — HYDROCODONE-ACETAMINOPHEN 5-325 MG PO TABS: 1.0000 | ORAL_TABLET | ORAL | 0 refills | Status: DC | PRN

## 2021-02-12 MED ORDER — HYDROMORPHONE HCL 1 MG/ML IJ SOLN
1.0000 mg | Freq: Once | INTRAMUSCULAR | Status: AC
  Administered 2021-02-12: 1 mg via INTRAMUSCULAR
  Filled 2021-02-12: qty 1

## 2021-02-12 MED ORDER — DIAZEPAM 5 MG PO TABS: 5.0000 mg | ORAL_TABLET | Freq: Two times a day (BID) | ORAL | 0 refills | Status: DC

## 2021-02-12 NOTE — ED Provider Notes (Signed)
[facility] EMERGENCY DEPARTMENT Provider Note   CSN: [id number] Arrival date & time: 02/12/21  8502     History Chief Complaint  Patient presents with  . Back Pain    Ernest Cochran is a 30 y.o. male.  Pt presents to the ED today with back pain.  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  This happened on Wednesday (3/9).  Pt has been taking ibuprofen, but it seems to be getting worse.  Today, he felt pain radiate down both of his legs and his legs feel weak.  He denies any trouble urinating.          Past Medical History:  Diagnosis Date  . Asthma   . Depression   . Eosinophilic esophagitis 2020  . GERD (gastroesophageal reflux disease)   . Kidney stone     Patient Active Problem List   Diagnosis Date Noted  . Eosinophilic esophagitis   . Hematuria 12/25/2019  . Depression   . Dyspepsia 07/15/2019  . Dysphagia 07/15/2019    Past Surgical History:  Procedure Laterality Date  . BIOPSY  01/07/2020   Procedure: BIOPSY;  Surgeon: West Bali, MD;  [facility];  Service: Endoscopy;;  duodenum esophagus  . ESOPHAGOGASTRODUODENOSCOPY (EGD) WITH PROPOFOL N/A 10/03/2019   Procedure: ESOPHAGOGASTRODUODENOSCOPY (EGD) WITH PROPOFOL;  Surgeon: West Bali, MD; LA grade B esophagitis s/p biopsy and dilation, mild gastritis s/p biopsy, and mild duodenitis.  Stomach biopsy with mild chronic inflammation negative for H. pylori.  Esophageal biopsy with inflamed squamous mucosa with increased eosinophils consistent with eosinophilic esophagitis.   Marland Kitchen ESOPHAGOGASTRODUODENOSCOPY (EGD) WITH PROPOFOL N/A 01/07/2020   Procedure: ESOPHAGOGASTRODUODENOSCOPY (EGD) WITH PROPOFOL;  Surgeon: West Bali, MD;  Esophageal mucosal changes secondary to eosinophilic esophagitis s/p biopsy, normal stomach, normal examined duodenum s/p biopsied.  Esophageal biopsies with increased intraepithelial eosinophils.  Duodenal bulb  biopsy with peptic duodenitis, second part of duodenum with benign small bowel mucosa.   Marland Kitchen SAVORY DILATION N/A 10/03/2019   Procedure: SAVORY DILATION;  Surgeon: West Bali, MD;  [facility];  Service: Endoscopy;  Laterality: N/A;  15/16/17       Family History  Problem Relation Age of Onset  . Colon cancer Maternal Grandfather   . Diabetes Maternal Grandfather   . Heart disease Maternal Grandfather   . Lung cancer Maternal Grandmother   . Asthma Son   . Colon polyps Neg Hx     Social History   Tobacco Use  . Smoking status: Former Smoker    Types: Cigarettes  . Smokeless tobacco: Never Used  Vaping Use  . Vaping Use: Never used  Substance Use Topics  . Alcohol use: Not Currently    Comment: occasional. 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   . Drug use: Not Currently    Types: Marijuana    Comment: denied 07/15/19, stopped 3-4 months ago.     Home Medications Prior to Admission medications   Medication Sig Start Date End Date Taking? Authorizing Provider  diazepam (VALIUM) 5 MG tablet Take 1 tablet (5 mg total) by mouth 2 (two) times daily. 02/12/21  Yes Jacalyn Lefevre, MD  HYDROcodone-acetaminophen (NORCO/VICODIN) 5-325 MG tablet Take 1 tablet by mouth every 4 (four) hours as needed. 02/12/21  Yes Jacalyn Lefevre, MD  ibuprofen (ADVIL) 600 MG tablet Take 1 tablet (600 mg total) by mouth every  6 (six) hours as needed. 02/12/21  Yes Jacalyn Lefevre, MD  predniSONE (STERAPRED UNI-PAK 21 TAB) 10 MG (21) TBPK tablet Take 6 tabs for 2 days, then 5 for 2 days, then 4 for 2 days, then 3 for 2 days, 2 for 2 days, then 1 for 2 days 02/12/21  Yes Jacalyn Lefevre, MD  benzonatate (TESSALON) 100 MG capsule Take 1 capsule (100 mg total) by mouth every 8 (eight) hours. 10/04/20   Wurst, [place], PA-C  fluticasone (FLOVENT HFA) 220 MCG/ACT inhaler 2 PUFFs INTO BACK OF THROAT AND THEN SWALLOW TWICE A DAY FOR 8 WEEKS. 01/14/20   Fields, Darleene Cleaver, MD  hydrOXYzine  (ATARAX/VISTARIL) 25 MG tablet Take 1 tablet (25 mg total) by mouth 3 (three) times daily as needed for anxiety. 02/12/20   Gwenlyn Fudge, FNP  lansoprazole (PREVACID) 30 MG capsule 1 po 30 mins prior to FIRST AND LAST MEAL 01/07/20   Fields, Darleene Cleaver, MD  naproxen (NAPROSYN) 500 MG tablet Take 1 tablet (500 mg total) by mouth 2 (two) times daily. 06/08/20   Wurst, [place], PA-C  ondansetron (ZOFRAN ODT) 4 MG disintegrating tablet Take 1 tablet (4 mg total) by mouth every 8 (eight) hours as needed for nausea or vomiting. 12/30/19   Shanon Ace, PA-C  oxyCODONE-acetaminophen (PERCOCET/ROXICET) 5-325 MG tablet Take 1 tablet by mouth every 6 (six) hours as needed for severe pain. 12/30/19   Shanon Ace, PA-C  promethazine-dextromethorphan (PROMETHAZINE-DM) 6.25-15 MG/5ML syrup Take 5 mLs by mouth 4 (four) times daily as needed for cough. 12/03/20   Moshe Cipro, NP  venlafaxine XR (EFFEXOR XR) 37.5 MG 24 hr capsule Take 1 capsule (37.5 mg total) by mouth daily. 02/12/20   Gwenlyn Fudge, FNP    Allergies    Patient has no known allergies.  Review of Systems   Review of Systems  Musculoskeletal: Positive for back pain.  All other systems reviewed and are negative.   Physical Exam Updated Vital Signs BP 116/60 (BP Location: Right Arm)   Pulse (!) 58   Temp 98.1 F (36.7 C) (Oral)   Resp 18   SpO2 98%   Physical Exam Vitals and nursing note reviewed.  Constitutional:      Appearance: Normal appearance.  HENT:     Head: Normocephalic and atraumatic.     Right Ear: External ear normal.     Left Ear: External ear normal.     Nose: Nose normal.     Mouth/Throat:     Mouth: Mucous membranes are moist.     Pharynx: Oropharynx is clear.  Eyes:     Extraocular Movements: Extraocular movements intact.     Conjunctiva/sclera: Conjunctivae normal.     Pupils: Pupils are equal, round, and reactive to light.  Cardiovascular:     Rate and Rhythm: Normal rate and  regular rhythm.     Pulses: Normal pulses.     Heart sounds: Normal heart sounds.  Pulmonary:     Effort: Pulmonary effort is normal.     Breath sounds: Normal breath sounds.  Abdominal:     General: Abdomen is flat. Bowel sounds are normal.     Palpations: Abdomen is soft.  Musculoskeletal:     Cervical back: Normal range of motion and neck supple.       Back:  Skin:    General: Skin is warm.     Capillary Refill: Capillary refill takes less than 2 seconds.  Neurological:     General: No  focal deficit present.     Mental Status: He is alert and oriented to person, place, and time.     Comments: + str leg raise bilaterally  Psychiatric:        Mood and Affect: Mood normal.        Behavior: Behavior normal.     ED Results / Procedures / Treatments   Labs (all labs ordered are listed, but only abnormal results are displayed) Labs Reviewed - No data to display  EKG None  Radiology No results found.  Procedures Procedures   Medications Ordered in ED Medications  dexamethasone (DECADRON) injection 10 mg (10 mg Intramuscular Given 02/12/21 1848)  morphine 4 MG/ML injection 4 mg (4 mg Intramuscular Given 02/12/21 1846)  HYDROmorphone (DILAUDID) injection 1 mg (1 mg Intramuscular Given 02/12/21 1932)    ED Course  I have reviewed the triage vital signs and the nursing notes.  Pertinent labs & imaging results that were available during my care of the patient were reviewed by me and considered in my medical decision making (see chart for details).    MDM Rules/Calculators/A&P                          Pt is feeling much better after treatment.  He is able to ambulate.  Return if worse.  F/u with pcp.  Final Clinical Impression(s) / ED Diagnoses Final diagnoses:  Acute sciatica    Rx / DC Orders ED Discharge Orders         Ordered    predniSONE (STERAPRED UNI-PAK 21 TAB) 10 MG (21) TBPK tablet        02/12/21 2024    diazepam (VALIUM) 5 MG tablet  2 times daily         02/12/21 2024    HYDROcodone-acetaminophen (NORCO/VICODIN) 5-325 MG tablet  Every 4 hours PRN        02/12/21 2024    ibuprofen (ADVIL) 600 MG tablet  Every 6 hours PRN        02/12/21 2024           Jacalyn Lefevre, MD 02/12/21 2025

## 2021-02-12 NOTE — ED Notes (Signed)
Pt verbalized understanding of no driving and to use caution within 4 hours of taking pain meds due to meds cause drowsiness 

## 2021-02-12 NOTE — ED Triage Notes (Signed)
States he was lifting heavy objects 2 days ago, states he has pain in back radiating into legs today

## 2021-02-16 ENCOUNTER — Other Ambulatory Visit: Payer: Self-pay

## 2021-02-16 ENCOUNTER — Encounter: Payer: Self-pay | Admitting: Family Medicine

## 2021-02-16 ENCOUNTER — Ambulatory Visit (INDEPENDENT_AMBULATORY_CARE_PROVIDER_SITE_OTHER): Payer: Medicaid Other

## 2021-02-16 ENCOUNTER — Ambulatory Visit (INDEPENDENT_AMBULATORY_CARE_PROVIDER_SITE_OTHER): Payer: Medicaid Other | Admitting: Family Medicine

## 2021-02-16 VITALS — BP 133/75 | HR 92 | Temp 97.9°F | Ht 70.0 in | Wt 248.6 lb

## 2021-02-16 DIAGNOSIS — M5442 Lumbago with sciatica, left side: Secondary | ICD-10-CM

## 2021-02-16 DIAGNOSIS — M5441 Lumbago with sciatica, right side: Secondary | ICD-10-CM | POA: Diagnosis not present

## 2021-02-16 NOTE — Patient Instructions (Signed)
Acute Back Pain, Adult Acute back pain is sudden and usually short-lived. It is often caused by an injury to the muscles and tissues in the back. The injury may result from:  A muscle or ligament getting overstretched or torn (strained). Ligaments are tissues that connect bones to each other. Lifting something improperly can cause a back strain.  Wear and tear (degeneration) of the spinal disks. Spinal disks are circular tissue that provide cushioning between the bones of the spine (vertebrae).  Twisting motions, such as while playing sports or doing yard work.  A hit to the back.  Arthritis. You may have a physical exam, lab tests, and imaging tests to find the cause of your pain. Acute back pain usually goes away with rest and home care. Follow these instructions at home: Managing pain, stiffness, and swelling  Treatment may include medicines for pain and inflammation that are taken by mouth or applied to the skin, prescription pain medicine, or muscle relaxants. Take over-the-counter and prescription medicines only as told by your health care provider.  Your health care provider may recommend applying ice during the first 24-48 hours after your pain starts. To do this: ? Put ice in a plastic bag. ? Place a towel between your skin and the bag. ? Leave the ice on for 20 minutes, 2-3 times a day.  If directed, apply heat to the affected area as often as told by your health care provider. Use the heat source that your health care provider recommends, such as a moist heat pack or a heating pad. ? Place a towel between your skin and the heat source. ? Leave the heat on for 20-30 minutes. ? Remove the heat if your skin turns bright red. This is especially important if you are unable to feel pain, heat, or cold. You have a greater risk of getting burned. Activity  Do not stay in bed. Staying in bed for more than 1-2 days can delay your recovery.  Sit up and stand up straight. Avoid leaning  forward when you sit or hunching over when you stand. ? If you work at a desk, sit close to it so you do not need to lean over. Keep your chin tucked in. Keep your neck drawn back, and keep your elbows bent at a 90-degree angle (right angle). ? Sit high and close to the steering wheel when you drive. Add lower back (lumbar) support to your car seat, if needed.  Take short walks on even surfaces as soon as you are able. Try to increase the length of time you walk each day.  Do not sit, drive, or stand in one place for more than 30 minutes at a time. Sitting or standing for long periods of time can put stress on your back.  Do not drive or use heavy machinery while taking prescription pain medicine.  Use proper lifting techniques. When you bend and lift, use positions that put less stress on your back: ? Bend your knees. ? Keep the load close to your body. ? Avoid twisting.  Exercise regularly as told by your health care provider. Exercising helps your back heal faster and helps prevent back injuries by keeping muscles strong and flexible.  Work with a physical therapist to make a safe exercise program, as recommended by your health care provider. Do any exercises as told by your physical therapist.   Lifestyle  Maintain a healthy weight. Extra weight puts stress on your back and makes it difficult to have   good posture.  Avoid activities or situations that make you feel anxious or stressed. Stress and anxiety increase muscle tension and can make back pain worse. Learn ways to manage anxiety and stress, such as through exercise. General instructions  Sleep on a firm mattress in a comfortable position. Try lying on your side with your knees slightly bent. If you lie on your back, put a pillow under your knees.  Follow your treatment plan as told by your health care provider. This may include: ? Cognitive or behavioral therapy. ? Acupuncture or massage therapy. ? Meditation or yoga. Contact  a health care provider if:  You have pain that is not relieved with rest or medicine.  You have increasing pain going down into your legs or buttocks.  Your pain does not improve after 2 weeks.  You have pain at night.  You lose weight without trying.  You have a fever or chills. Get help right away if:  You develop new bowel or bladder control problems.  You have unusual weakness or numbness in your arms or legs.  You develop nausea or vomiting.  You develop abdominal pain.  You feel faint. Summary  Acute back pain is sudden and usually short-lived.  Use proper lifting techniques. When you bend and lift, use positions that put less stress on your back.  Take over-the-counter and prescription medicines and apply heat or ice as directed by your health care provider. This information is not intended to replace advice given to you by your health care provider. Make sure you discuss any questions you have with your health care provider. Document Revised: 08/14/2020 Document Reviewed: 08/14/2020 Elsevier Patient Education  2021 [employer]vier Inc.  

## 2021-02-16 NOTE — Progress Notes (Signed)
Assessment & Plan:  1. Acute midline low back pain with bilateral sciatica Patient declined a muscle relaxer.  He feels the ibuprofen is all he needs for pain control.  He will continue his course of prednisone.  Discussed he could also add a Lidoderm patch and heat.  He was written out of work until we can follow-up in 1 week to see how he is doing.  Education provided on back pain and exercises he can do. - DG Lumbar Spine 2-3 Views - Ambulatory referral to Physical Therapy   Follow up plan: Return in about 1 week (around 02/23/2021) for back pain.  [name], MSN, APRN, FNP-C [facility]  Subjective:   Patient ID: Ernest Cochran, male    DOB: June 06, 1991, 30 y.o.   MRN: [id number]  HPI: Ernest Cochran is a 30 y.o. male presenting on 02/16/2021 for ER follow up (02/12/21 acute sciatica - [facility]. Patient states it is better but still there./)  Patient was seen at [facility], ER on 02/12/2021 due to back pain that started after heavy lifting.  He was given Decadron 10 mg IM, morphine 4 mg IM, and Dilaudid 1 mg IM.  He was discharged with a steroid taper, Valium, and Norco.  Patient reports he is mildly better.  He has not been taking the Valium as he was told this was a muscle relaxer and he does not feel he has problems in the muscle as he has had a strained muscle in the past.  He took the hydrocodone as prescribed but states it did not help.  He is also taking ibuprofen as prescribed which he feels helps the most.  He reports the pain is much worse when he is on his feet for more than about 5 or 10 minutes.  He can get comfortable lying down.  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   ROS: Negative unless specifically indicated above in HPI.   Relevant past medical history reviewed and updated as indicated.   Allergies and medications reviewed  and updated.   Current Outpatient Medications:  .  diazepam (VALIUM) 5 MG tablet, Take 1 tablet (5 mg total) by mouth 2 (two) times daily., Disp: 10 tablet, Rfl: 0 .  fluticasone (FLOVENT HFA) 220 MCG/ACT inhaler, 2 PUFFs INTO BACK OF THROAT AND THEN SWALLOW TWICE A DAY FOR 8 WEEKS., Disp: 1 Inhaler, Rfl: 2 .  hydrOXYzine (ATARAX/VISTARIL) 25 MG tablet, Take 1 tablet (25 mg total) by mouth 3 (three) times daily as needed for anxiety., Disp: 30 tablet, Rfl: 2 .  ibuprofen (ADVIL) 600 MG tablet, Take 1 tablet (600 mg total) by mouth every 6 (six) hours as needed., Disp: 30 tablet, Rfl: 0 .  naproxen (NAPROSYN) 500 MG tablet, Take 1 tablet (500 mg total) by mouth 2 (two) times daily., Disp: 30 tablet, Rfl: 0 .  predniSONE (STERAPRED UNI-PAK 21 TAB) 10 MG (21) TBPK tablet, Take 6 tabs for 2 days, then 5 for 2 days, then 4 for 2 days, then 3 for 2 days, 2 for 2 days, then 1 for 2 days, Disp: 42 tablet, Rfl: 0 .  venlafaxine XR (EFFEXOR XR) 37.5 MG 24 hr capsule, Take 1 capsule (37.5 mg total) by mouth daily., Disp: 90 capsule, Rfl: 2  No Known Allergies  Objective:   BP 133/75   Pulse 92  Temp 97.9 F (36.6 C) (Temporal)   Ht 5\' 10"  (1.778 m)   Wt 248 lb 9.6 oz (112.8 kg)   SpO2 95%   BMI 35.67 kg/m    Physical Exam Vitals reviewed.  Constitutional:      General: He is not in acute distress.    Appearance: Normal appearance. He is not ill-appearing, toxic-appearing or diaphoretic.  HENT:     Head: Normocephalic and atraumatic.  Eyes:     General: No scleral icterus.       Right eye: No discharge.        Left eye: No discharge.     Conjunctiva/sclera: Conjunctivae normal.  Cardiovascular:     Rate and Rhythm: Normal rate.  Pulmonary:     Effort: Pulmonary effort is normal. No respiratory distress.  Musculoskeletal:        General: Normal range of motion.     Cervical back: Normal range of motion.     Lumbar back: Bony tenderness present.  Skin:    General: Skin is warm and  dry.  Neurological:     Mental Status: He is alert and oriented to person, place, and time. Mental status is at baseline.     Gait: Gait abnormal.  Psychiatric:        Mood and Affect: Mood normal.        Behavior: Behavior normal.        Thought Content: Thought content normal.        Judgment: Judgment normal.

## 2021-02-23 ENCOUNTER — Other Ambulatory Visit: Payer: Self-pay

## 2021-02-23 ENCOUNTER — Encounter: Payer: Self-pay | Admitting: Family Medicine

## 2021-02-23 ENCOUNTER — Ambulatory Visit: Payer: Medicaid Other | Admitting: Family Medicine

## 2021-02-23 VITALS — BP 129/80 | HR 68 | Temp 97.2°F | Ht 70.0 in | Wt 244.0 lb

## 2021-02-23 DIAGNOSIS — M5441 Lumbago with sciatica, right side: Secondary | ICD-10-CM | POA: Diagnosis not present

## 2021-02-23 DIAGNOSIS — M5442 Lumbago with sciatica, left side: Secondary | ICD-10-CM

## 2021-02-23 NOTE — Progress Notes (Signed)
Assessment & Plan:  1. Acute midline low back pain with bilateral sciatica Advised not to take both ibuprofen and Bayer back and body, but that he could do ibuprofen and Tylenol.  Complete prednisone taper.  Keep appointment with physical therapy tomorrow.  Offered a referral to [facility], but patient would like to wait until after he has done some physical therapy.   Follow up plan: Return in about 6 weeks (around 04/06/2021) for back pain.  [name], MSN, APRN, FNP-C [facility]  Subjective:   Patient ID: Ernest Cochran, male    DOB: [birth year], 30 y.o.   MRN: [id number]  HPI: Ernest Cochran is a 30 y.o. male presenting on 02/23/2021 for Back Pain (1 week follow up)  Patient presents for follow-up of low back pain.  He has been taking ibuprofen for pain as well as Bayer back and body.  He will finish his prednisone taper tomorrow.  He did try lidocaine patches, which he did not feel was helpful.  He will start physical therapy tomorrow.  He does report that his job let him go, but told him that they would hire him back when he was back up on his feet and able.   ROS: Negative unless specifically indicated above in HPI.   Relevant past medical history reviewed and updated as indicated.   Allergies and medications reviewed and updated.   Current Outpatient Medications:  .  fluticasone (FLOVENT HFA) 220 MCG/ACT inhaler, 2 PUFFs INTO BACK OF THROAT AND THEN SWALLOW TWICE A DAY FOR 8 WEEKS., Disp: 1 Inhaler, Rfl: 2 .  hydrOXYzine (ATARAX/VISTARIL) 25 MG tablet, Take 1 tablet (25 mg total) by mouth 3 (three) times daily as needed for anxiety., Disp: 30 tablet, Rfl: 2 .  ibuprofen (ADVIL) 600 MG tablet, Take 1 tablet (600 mg total) by mouth every 6 (six) hours as needed., Disp: 30 tablet, Rfl: 0 .  predniSONE (STERAPRED UNI-PAK 21 TAB) 10 MG (21) TBPK tablet, Take 6 tabs for 2 days, then 5 for 2 days, then 4 for 2 days, then 3 for 2 days, 2  for 2 days, then 1 for 2 days, Disp: 42 tablet, Rfl: 0 .  venlafaxine XR (EFFEXOR XR) 37.5 MG 24 hr capsule, Take 1 capsule (37.5 mg total) by mouth daily., Disp: 90 capsule, Rfl: 2  No Known Allergies  Objective:   BP 129/80   Pulse 68   Temp (!) 97.2 F (36.2 C) (Temporal)   Ht 5\' 10"  (1.778 m)   Wt 244 lb (110.7 kg)   SpO2 100%   BMI 35.01 kg/m    Physical Exam Vitals reviewed.  Constitutional:      General: He is not in acute distress.    Appearance: Normal appearance. He is morbidly obese. He is not ill-appearing, toxic-appearing or diaphoretic.  HENT:     Head: Normocephalic and atraumatic.  Eyes:     General: No scleral icterus.       Right eye: No discharge.        Left eye: No discharge.     Conjunctiva/sclera: Conjunctivae normal.  Cardiovascular:     Rate and Rhythm: Normal rate.  Pulmonary:     Effort: Pulmonary effort is normal. No respiratory distress.  Musculoskeletal:        General: Normal range of motion.     Cervical back: Normal range of motion.  Skin:    General: Skin is warm and dry.  Neurological:  Mental Status: He is alert and oriented to person, place, and time. Mental status is at baseline.  Psychiatric:        Mood and Affect: Mood normal.        Behavior: Behavior normal.        Thought Content: Thought content normal.        Judgment: Judgment normal.

## 2021-02-24 ENCOUNTER — Ambulatory Visit: Payer: Medicaid Other | Attending: Family Medicine | Admitting: Physical Therapy

## 2021-02-24 ENCOUNTER — Other Ambulatory Visit: Payer: Self-pay

## 2021-02-24 ENCOUNTER — Encounter: Payer: Self-pay | Admitting: Physical Therapy

## 2021-02-24 DIAGNOSIS — M545 Low back pain, unspecified: Secondary | ICD-10-CM | POA: Insufficient documentation

## 2021-02-24 NOTE — Therapy (Signed)
[facility] [address] [place], [place], [address] Phone: [phone]   Fax:  [phone]  Physical Therapy Evaluation  Patient Details  Name: Ernest Cochran MRN: [id number] Date of Birth: 1991-03-31 Referring Provider (PT): [name].   Encounter Date: 02/24/2021   PT End of Session - 02/24/21 1513    Visit Number 1    Number of Visits 12    Date for PT Re-Evaluation 04/07/21    PT Start Time 0108    PT Stop Time 0133    PT Time Calculation (min) 25 min    Activity Tolerance Patient tolerated treatment well    Behavior During Therapy [facility] for tasks assessed/performed           Past Medical History:  Diagnosis Date  . Asthma   . Depression   . Eosinophilic esophagitis 2020  . GERD (gastroesophageal reflux disease)   . Kidney stone     Past Surgical History:  Procedure Laterality Date  . BIOPSY  01/07/2020   Procedure: BIOPSY;  Surgeon: West Bali, MD;  Location: [facility] SUITE;  Service: Endoscopy;;  duodenum esophagus  . ESOPHAGOGASTRODUODENOSCOPY (EGD) WITH PROPOFOL N/A 10/03/2019   Procedure: ESOPHAGOGASTRODUODENOSCOPY (EGD) WITH PROPOFOL;  Surgeon: West Bali, MD; LA grade B esophagitis s/p biopsy and dilation, mild gastritis s/p biopsy, and mild duodenitis.  Stomach biopsy with mild chronic inflammation negative for H. pylori.  Esophageal biopsy with inflamed squamous mucosa with increased eosinophils consistent with eosinophilic esophagitis.   Marland Kitchen ESOPHAGOGASTRODUODENOSCOPY (EGD) WITH PROPOFOL N/A 01/07/2020   Procedure: ESOPHAGOGASTRODUODENOSCOPY (EGD) WITH PROPOFOL;  Surgeon: West Bali, MD;  Esophageal mucosal changes secondary to eosinophilic esophagitis s/p biopsy, normal stomach, normal examined duodenum s/p biopsied.  Esophageal biopsies with increased intraepithelial eosinophils.  Duodenal bulb biopsy with peptic duodenitis, second part of duodenum with benign small bowel mucosa.   Marland Kitchen SAVORY DILATION  N/A 10/03/2019   Procedure: SAVORY DILATION;  Surgeon: West Bali, MD;  Location: [facility];  Service: Endoscopy;  Laterality: N/A;  15/16/17    There were no vitals filed for this visit.    Subjective Assessment - 02/24/21 1502    Subjective COVID-19 screen performed prior to patient entering clinic.  The patient presenst to the clinic today with c/o low back pain after lifting a heavy car part about two weeks ago.  His pain at rets is a 5/10 today but can rise to higher levels if standing and walking longer than 15 minutes.  Lying down decreases his pain.  He has had epiosodes of LBP in the past.    Pertinent History H/o LBP.    How long can you stand comfortably? 15 minutes.    How long can you walk comfortably? 15 minutes.    Diagnostic tests X-ray.    Patient Stated Goals Return to work.    Pain Score 5     Pain Location Back    Pain Orientation Mid;Lower    Pain Descriptors / Indicators Aching;Tightness;Shooting    Pain Type Acute pain              [facility] PT Assessment - 02/24/21 0001      Assessment   Medical Diagnosis Acute midline low back pain.    Referring Provider (PT) [name].    Onset Date/Surgical Date --   ~2 weeks.     Precautions   Precautions None      Restrictions   Weight Bearing Restrictions No  Balance Screen   Has the patient fallen in the past 6 months No    Has the patient had a decrease in activity level because of a fear of falling?  No    Is the patient reluctant to leave their home because of a fear of falling?  No      Home Environment   Living Environment Private residence      Prior Function   Level of Independence Independent      Deep Tendon Reflexes   DTR Assessment Site Patella;Achilles    Patella DTR 2+    Achilles DTR 2+      ROM / Strength   AROM / PROM / Strength AROM;Strength      AROM   Overall AROM Comments Active lumbar flexion limited by 75% and extension is full.      Strength   Overall  Strength Comments Normal LE strength.      Palpation   Palpation comment Tender to palpation at L5-S1.      Special Tests   Other special tests Pain reproduction with bilateral SLR testing.  Equal leg lengths. (-) FABER testing.      Transfers   Comments Sit to stand wiht armrest in obvious pain.      Ambulation/Gait   Gait Comments Slow and cautious.                      Objective measurements completed on examination: See above findings.                    PT Long Term Goals - 02/24/21 1525      PT LONG TERM GOAL #1   Title Independent with a HEP.    Baseline No knowledge of appropriate ther ex.    Time 6    Period Weeks    Status New      PT LONG TERM GOAL #2   Title Stand 30 minutes with pain not > 2/10.    Baseline Stand rise to high levels when standing longer than 15 minutes.    Time 6    Period Weeks    Status New      PT LONG TERM GOAL #3   Title Perform ADL's with pain not > 2/10.    Baseline Attemtping ADL's increases pain to high levels.    Time 6    Period Weeks    Status New                  Plan - 02/24/21 1519    Clinical Impression Statement The patient presents to [facility] with c/o low back pain from a lifting injury about two weeks ago.  His pain was localized todday to L5-S1.  He is very limited into lumbar flexion.  He has pain in his low back with SLR testing.  He transfer from sit to stand with pain and his walking is slow and cautious due to pain.  Lower strength and DTR's are normal.  Patient will benefit from skilled physical therapy intervention to address pain and deficits.    Personal Factors and Comorbidities Comorbidity 1;Other    Comorbidities H/o LBP.    Examination-Activity Limitations Other;Transfers;Locomotion Level    Examination-Participation Restrictions Other    Stability/Clinical Decision Making Evolving/Moderate complexity    Clinical Decision Making Low    Rehab Potential Excellent    PT  Frequency 2x / week    PT Duration 6 weeks  PT Treatment/Interventions ADLs/Self Care Home Management;Cryotherapy;Ultrasound;Functional mobility training;Therapeutic activities;Therapeutic exercise;Manual techniques;Patient/family education;Passive range of motion    PT Next Visit Plan Core exercise progression, combo e'stim/US, STW/M.    Consulted and Agree with Plan of Care Patient           Patient will benefit from skilled therapeutic intervention in order to improve the following deficits and impairments:  Difficulty walking,Decreased activity tolerance,Pain,Decreased range of motion  Visit Diagnosis: Acute midline low back pain without sciatica - Plan: PT plan of care cert/re-cert     Problem List Patient Active Problem List   Diagnosis Date Noted  . Eosinophilic esophagitis   . Hematuria 12/25/2019  . Depression   . Dyspepsia 07/15/2019  . Dysphagia 07/15/2019    Brettney Ficken, [place] MPT 02/24/2021, 3:28 PM  [facility] [address] [place], [place], [address] Phone: [phone]   Fax:  [phone]  Name: Ernest Cochran MRN: [id number] Date of Birth: [birth year]

## 2021-03-17 ENCOUNTER — Other Ambulatory Visit: Payer: Self-pay

## 2021-03-17 ENCOUNTER — Encounter: Payer: Self-pay | Admitting: Physical Therapy

## 2021-03-17 ENCOUNTER — Ambulatory Visit: Payer: Medicaid Other | Attending: Family Medicine | Admitting: Physical Therapy

## 2021-03-17 DIAGNOSIS — M545 Low back pain, unspecified: Secondary | ICD-10-CM | POA: Diagnosis not present

## 2021-03-17 NOTE — Therapy (Signed)
[facility] [address] [place], [place], [address] Phone: [phone]   Fax:  [phone]  Physical Therapy Treatment  Patient Details  Name: Ernest Cochran MRN: [id number] Date of Birth: 1990-12-22 Referring Provider (PT): [name].   Encounter Date: 03/17/2021   PT End of Session - 03/17/21 0817    Visit Number 2    Number of Visits 12    Date for PT Re-Evaluation 04/07/21    PT Start Time 0817    PT Stop Time 0858    PT Time Calculation (min) 41 min    Activity Tolerance Patient tolerated treatment well    Behavior During Therapy [facility] for tasks assessed/performed           Past Medical History:  Diagnosis Date  . Asthma   . Depression   . Eosinophilic esophagitis 2020  . GERD (gastroesophageal reflux disease)   . Kidney stone     Past Surgical History:  Procedure Laterality Date  . BIOPSY  01/07/2020   Procedure: BIOPSY;  Surgeon: West Bali, MD;  [facility];  Service: Endoscopy;;  duodenum esophagus  . ESOPHAGOGASTRODUODENOSCOPY (EGD) WITH PROPOFOL N/A 10/03/2019   Procedure: ESOPHAGOGASTRODUODENOSCOPY (EGD) WITH PROPOFOL;  Surgeon: West Bali, MD; LA grade B esophagitis s/p biopsy and dilation, mild gastritis s/p biopsy, and mild duodenitis.  Stomach biopsy with mild chronic inflammation negative for H. pylori.  Esophageal biopsy with inflamed squamous mucosa with increased eosinophils consistent with eosinophilic esophagitis.   Marland Kitchen ESOPHAGOGASTRODUODENOSCOPY (EGD) WITH PROPOFOL N/A 01/07/2020   Procedure: ESOPHAGOGASTRODUODENOSCOPY (EGD) WITH PROPOFOL;  Surgeon: West Bali, MD;  Esophageal mucosal changes secondary to eosinophilic esophagitis s/p biopsy, normal stomach, normal examined duodenum s/p biopsied.  Esophageal biopsies with increased intraepithelial eosinophils.  Duodenal bulb biopsy with peptic duodenitis, second part of duodenum with benign small bowel mucosa.   Marland Kitchen SAVORY DILATION  N/A 10/03/2019   Procedure: SAVORY DILATION;  Surgeon: West Bali, MD;  [facility];  Service: Endoscopy;  Laterality: N/A;  15/16/17    There were no vitals filed for this visit.   Subjective Assessment - 03/17/21 0812    Subjective COVID-19 screen performed prior to patient entering clinic. Patient reports more pain in RLE but has been getting some walking in while taking his kids fishing.    Pertinent History H/o LBP.    How long can you stand comfortably? 15 minutes.    How long can you walk comfortably? 15 minutes.    Diagnostic tests X-ray.    Patient Stated Goals Return to work.    Currently in Pain? Yes    Pain Score 4     Pain Location Back    Pain Orientation Right    Pain Descriptors / Indicators Throbbing;Stabbing    Pain Type Acute pain    Pain Radiating Towards RLE    Pain Onset 1 to 4 weeks ago    Pain Frequency Constant              [facility] PT Assessment - 03/17/21 0001      Assessment   Medical Diagnosis Acute midline low back pain.    Referring Provider (PT) [name].    Next MD Visit 04/06/2021      Precautions   Precautions None      Restrictions   Weight Bearing Restrictions No  [facility] Adult PT Treatment/Exercise - 03/17/21 0001      Exercises   Exercises Lumbar      Lumbar Exercises: Stretches   Single Knee to Chest Stretch Right;3 reps;20 seconds    Single Knee to Chest Stretch Limitations more pain    Lower Trunk Rotation 5 reps;10 seconds    Figure 4 Stretch 2 reps;30 seconds;Seated;Without overpressure      Lumbar Exercises: Aerobic   Recumbent Bike L1, seat 7 x8 min      Lumbar Exercises: Seated   Hip Flexion on Ball AROM;Both;15 reps      Lumbar Exercises: Supine   Ab Set 10 reps;5 seconds                  PT Education - 03/17/21 0905    Education Details Posture, ADLs handout, ab set    Person(s) Educated Patient    Methods Explanation;Demonstration;Handout     Comprehension Verbalized understanding;Returned demonstration               PT Long Term Goals - 02/24/21 1525      PT LONG TERM GOAL #1   Title Independent with a HEP.    Baseline No knowledge of appropriate ther ex.    Time 6    Period Weeks    Status New      PT LONG TERM GOAL #2   Title Stand 30 minutes with pain not > 2/10.    Baseline Stand rise to high levels when standing longer than 15 minutes.    Time 6    Period Weeks    Status New      PT LONG TERM GOAL #3   Title Perform ADL's with pain not > 2/10.    Baseline Attemtping ADL's increases pain to high levels.    Time 6    Period Weeks    Status New                 Plan - 03/17/21 0906    Clinical Impression Statement Patient presented in clinic with mid level LBP with greater reports of radicular symptoms down RLE. If patient sits for a prolonged period of time the radicular symptoms present in LLE as well per patient report. Patient guided through light therex but limited somewhat by pain in low back. Patient educated throughout treatment regarding rationale for core strengthening, symptoms. Patient also provided posture, ADLs handout to help reduce exaggeration of pain with ADLs. Patient able to demonstrate log rolling into and out of bed with verbal guidance. Patient also instructed in completing ab set throughout the day. Patient verbalized understanding of all instruction and education provided in treatment. Patient also encouraged to use heating pad if needed for pain for 15-20 minutes.    Personal Factors and Comorbidities Comorbidity 1;Other    Comorbidities H/o LBP.    Examination-Activity Limitations Other;Transfers;Locomotion Level    Examination-Participation Restrictions Other    Stability/Clinical Decision Making Evolving/Moderate complexity    Rehab Potential Excellent    PT Frequency 2x / week    PT Duration 6 weeks    PT Treatment/Interventions ADLs/Self Care Home  Management;Cryotherapy;Ultrasound;Functional mobility training;Therapeutic activities;Therapeutic exercise;Manual techniques;Patient/family education;Passive range of motion    PT Next Visit Plan Core exercise progression, combo e'stim/US, STW/M.    Consulted and Agree with Plan of Care Patient           Patient will benefit from skilled therapeutic intervention in order to improve the following deficits and impairments:  Difficulty  walking,Decreased activity tolerance,Pain,Decreased range of motion  Visit Diagnosis: Acute midline low back pain without sciatica     Problem List Patient Active Problem List   Diagnosis Date Noted  . Eosinophilic esophagitis   . Hematuria 12/25/2019  . Depression   . Dyspepsia 07/15/2019  . Dysphagia 07/15/2019    Ernest Cochran, PTA 03/17/2021, 9:20 AM  [facility] [address] [place], [place], [address] Phone: [phone]   Fax:  [phone]  Name: ARTIS BEGGS MRN: [id number] Date of Birth: 1991/11/04

## 2021-03-17 NOTE — Patient Instructions (Signed)

## 2021-03-24 ENCOUNTER — Ambulatory Visit: Payer: Medicaid Other | Admitting: Physical Therapy

## 2021-03-24 ENCOUNTER — Other Ambulatory Visit: Payer: Self-pay

## 2021-03-24 DIAGNOSIS — M545 Low back pain, unspecified: Secondary | ICD-10-CM | POA: Diagnosis not present

## 2021-03-24 NOTE — Therapy (Signed)
[facility] [address] [place], [place], [address] Phone: [phone]   Fax:  [phone]  Physical Therapy Treatment  Patient Details  Name: Ernest Cochran MRN: [id number] Date of Birth: [birth year] Referring Provider (PT): [name].   Encounter Date: 03/24/2021   PT End of Session - 03/24/21 0936    Visit Number 3    Number of Visits 12    Date for PT Re-Evaluation 04/07/21    PT Start Time 0815    PT Stop Time 0846    PT Time Calculation (min) 31 min    Activity Tolerance Patient tolerated treatment well    Behavior During Therapy [facility] for tasks assessed/performed           Past Medical History:  Diagnosis Date  . Asthma   . Depression   . Eosinophilic esophagitis 2020  . GERD (gastroesophageal reflux disease)   . Kidney stone     Past Surgical History:  Procedure Laterality Date  . BIOPSY  01/07/2020   Procedure: BIOPSY;  Surgeon: West Bali, MD;  [facility];  Service: Endoscopy;;  duodenum esophagus  . ESOPHAGOGASTRODUODENOSCOPY (EGD) WITH PROPOFOL N/A 10/03/2019   Procedure: ESOPHAGOGASTRODUODENOSCOPY (EGD) WITH PROPOFOL;  Surgeon: West Bali, MD; LA grade B esophagitis s/p biopsy and dilation, mild gastritis s/p biopsy, and mild duodenitis.  Stomach biopsy with mild chronic inflammation negative for H. pylori.  Esophageal biopsy with inflamed squamous mucosa with increased eosinophils consistent with eosinophilic esophagitis.   Marland Kitchen ESOPHAGOGASTRODUODENOSCOPY (EGD) WITH PROPOFOL N/A 01/07/2020   Procedure: ESOPHAGOGASTRODUODENOSCOPY (EGD) WITH PROPOFOL;  Surgeon: West Bali, MD;  Esophageal mucosal changes secondary to eosinophilic esophagitis s/p biopsy, normal stomach, normal examined duodenum s/p biopsied.  Esophageal biopsies with increased intraepithelial eosinophils.  Duodenal bulb biopsy with peptic duodenitis, second part of duodenum with benign small bowel mucosa.   Marland Kitchen SAVORY DILATION  N/A 10/03/2019   Procedure: SAVORY DILATION;  Surgeon: West Bali, MD;  [facility];  Service: Endoscopy;  Laterality: N/A;  15/16/17    There were no vitals filed for this visit.   Subjective Assessment - 03/24/21 0913    Subjective COVID-19 screen performed prior to patient entering clinic.  Helped my wife's grandpa wiht a fence yesterday so pain is at a 5/10.    How long can you stand comfortably? 15 minutes.    How long can you walk comfortably? 15 minutes.    Diagnostic tests X-ray.    Patient Stated Goals Return to work.    Currently in Pain? Yes    Pain Score 5     Pain Location Back    Pain Orientation Right    Pain Descriptors / Indicators Throbbing;Stabbing    Pain Type Acute pain                             [facility] Adult PT Treatment/Exercise - 03/24/21 0001      Exercises   Exercises Lumbar      Lumbar Exercises: Aerobic   Nustep Level 1 x 10 minutes.      Modalities   Modalities Moist Heat      Moist Heat Therapy   Number Minutes Moist Heat 10 Minutes    Moist Heat Location Lumbar Spine      Manual Therapy   Manual Therapy Soft tissue mobilization    Manual therapy comments In sdly position:  STW/M x 5 minutes to patient's  affected lumbar region.                       PT Long Term Goals - 02/24/21 1525      PT LONG TERM GOAL #1   Title Independent with a HEP.    Baseline No knowledge of appropriate ther ex.    Time 6    Period Weeks    Status New      PT LONG TERM GOAL #2   Title Stand 30 minutes with pain not > 2/10.    Baseline Stand rise to high levels when standing longer than 15 minutes.    Time 6    Period Weeks    Status New      PT LONG TERM GOAL #3   Title Perform ADL's with pain not > 2/10.    Baseline Attemtping ADL's increases pain to high levels.    Time 6    Period Weeks    Status New                 Plan - 03/24/21 0934    Clinical Impression Statement Patient with  increased pain today due to helping with a fence.  He did well with low-level Nustep today.    Personal Factors and Comorbidities Comorbidity 1;Other    Comorbidities H/o LBP.    Examination-Activity Limitations Other;Transfers;Locomotion Level    Examination-Participation Restrictions Other    Stability/Clinical Decision Making Evolving/Moderate complexity    Rehab Potential Excellent    PT Frequency 2x / week    PT Duration 6 weeks    PT Treatment/Interventions ADLs/Self Care Home Management;Cryotherapy;Ultrasound;Functional mobility training;Therapeutic activities;Therapeutic exercise;Manual techniques;Patient/family education;Passive range of motion    PT Next Visit Plan Core exercise progression, combo e'stim/US, STW/M.    Consulted and Agree with Plan of Care Patient           Patient will benefit from skilled therapeutic intervention in order to improve the following deficits and impairments:  Difficulty walking,Decreased activity tolerance,Pain,Decreased range of motion  Visit Diagnosis: Acute midline low back pain without sciatica     Problem List Patient Active Problem List   Diagnosis Date Noted  . Eosinophilic esophagitis   . Hematuria 12/25/2019  . Depression   . Dyspepsia 07/15/2019  . Dysphagia 07/15/2019    Ernest Cochran, Ernest Cochran MPT 03/24/2021, 9:37 AM  [facility] [address] [place], [place], [address] Phone: [phone]   Fax:  [phone]  Name: Ernest Cochran MRN: [id number] Date of Birth: [birth year]

## 2021-03-26 ENCOUNTER — Ambulatory Visit: Payer: Medicaid Other | Admitting: *Deleted

## 2021-03-26 ENCOUNTER — Other Ambulatory Visit: Payer: Self-pay

## 2021-03-26 DIAGNOSIS — M545 Low back pain, unspecified: Secondary | ICD-10-CM | POA: Diagnosis not present

## 2021-03-26 NOTE — Therapy (Signed)
[facility] [address] [place], [place], [address] Phone: [phone]   Fax:  [phone]  Physical Therapy Treatment  Patient Details  Name: Ernest Cochran MRN: [id number] Date of Birth: 1991/04/20 Referring Provider (PT): [name].   Encounter Date: 03/26/2021   PT End of Session - 03/26/21 0810    Visit Number 4    Number of Visits 12    Date for PT Re-Evaluation 04/07/21    PT Start Time 0815    PT Stop Time 0904    PT Time Calculation (min) 49 min           Past Medical History:  Diagnosis Date  . Asthma   . Depression   . Eosinophilic esophagitis 2020  . GERD (gastroesophageal reflux disease)   . Kidney stone     Past Surgical History:  Procedure Laterality Date  . BIOPSY  01/07/2020   Procedure: BIOPSY;  Surgeon: West Bali, MD;  [facility];  Service: Endoscopy;;  duodenum esophagus  . ESOPHAGOGASTRODUODENOSCOPY (EGD) WITH PROPOFOL N/A 10/03/2019   Procedure: ESOPHAGOGASTRODUODENOSCOPY (EGD) WITH PROPOFOL;  Surgeon: West Bali, MD; LA grade B esophagitis s/p biopsy and dilation, mild gastritis s/p biopsy, and mild duodenitis.  Stomach biopsy with mild chronic inflammation negative for H. pylori.  Esophageal biopsy with inflamed squamous mucosa with increased eosinophils consistent with eosinophilic esophagitis.   Marland Kitchen ESOPHAGOGASTRODUODENOSCOPY (EGD) WITH PROPOFOL N/A 01/07/2020   Procedure: ESOPHAGOGASTRODUODENOSCOPY (EGD) WITH PROPOFOL;  Surgeon: West Bali, MD;  Esophageal mucosal changes secondary to eosinophilic esophagitis s/p biopsy, normal stomach, normal examined duodenum s/p biopsied.  Esophageal biopsies with increased intraepithelial eosinophils.  Duodenal bulb biopsy with peptic duodenitis, second part of duodenum with benign small bowel mucosa.   Marland Kitchen SAVORY DILATION N/A 10/03/2019   Procedure: SAVORY DILATION;  Surgeon: West Bali, MD;  [facility];  Service:  Endoscopy;  Laterality: N/A;  15/16/17    There were no vitals filed for this visit.   Subjective Assessment - 03/26/21 0810    Subjective COVID-19 screen performed prior to patient entering clinic.doing about the same as far as LB pain    Pertinent History H/o LBP.    How long can you stand comfortably? 15 minutes.    How long can you walk comfortably? 15 minutes.    Diagnostic tests X-ray.    Patient Stated Goals Return to work.                             [facility] Adult PT Treatment/Exercise - 03/26/21 0001      Self-Care   Self-Care ADL's;Posture    Posture AB bracing and finding neutral position. Avoid seated position during caughing and sneezing      Therapeutic Activites    Therapeutic Activities ADL's;Work Simulation    ADL's Sleeping postures,LOG roll with AB bracing in/out of bed, Towel roll used for sleeping and sitting posture as needed, donning doffing socks and shoes. AB bracing and maintaining neutral position during transitional movements, Kneeling and chair squat reviewed and performed.      Exercises   Exercises Lumbar      Lumbar Exercises: Aerobic   Nustep Level 1 x 10 minutes.      Lumbar Exercises: Standing   Other Standing Lumbar Exercises AB bracing and neutral position  PT Long Term Goals - 02/24/21 1525      PT LONG TERM GOAL #1   Title Independent with a HEP.    Baseline No knowledge of appropriate ther ex.    Time 6    Period Weeks    Status New      PT LONG TERM GOAL #2   Title Stand 30 minutes with pain not > 2/10.    Baseline Stand rise to high levels when standing longer than 15 minutes.    Time 6    Period Weeks    Status New      PT LONG TERM GOAL #3   Title Perform ADL's with pain not > 2/10.    Baseline Attemtping ADL's increases pain to high levels.    Time 6    Period Weeks    Status New                 Plan - 03/26/21 0817    Personal Factors and Comorbidities  Comorbidity 1;Other    Comorbidities H/o LBP.    Examination-Activity Limitations Other;Transfers;Locomotion Level    Examination-Participation Restrictions Other    Stability/Clinical Decision Making Evolving/Moderate complexity    Rehab Potential Excellent    PT Frequency 2x / week    PT Treatment/Interventions ADLs/Self Care Home Management;Cryotherapy;Ultrasound;Functional mobility training;Therapeutic activities;Therapeutic exercise;Manual techniques;Patient/family education;Passive range of motion    PT Next Visit Plan Core exercise progression, combo e'stim/US, STW/M.    Consulted and Agree with Plan of Care Patient           Patient will benefit from skilled therapeutic intervention in order to improve the following deficits and impairments:  Difficulty walking,Decreased activity tolerance,Pain,Decreased range of motion  Visit Diagnosis: Acute midline low back pain without sciatica     Problem List Patient Active Problem List   Diagnosis Date Noted  . Eosinophilic esophagitis   . Hematuria 12/25/2019  . Depression   . Dyspepsia 07/15/2019  . Dysphagia 07/15/2019    Yitzchok Carriger,CHRIS, PTA 03/26/2021, 12:47 PM  [facility] [address] [place], [place], [address] Phone: [phone]   Fax:  [phone]  Name: Ernest Cochran MRN: [id number] Date of Birth: November 17, 1991

## 2021-04-01 ENCOUNTER — Other Ambulatory Visit: Payer: Self-pay

## 2021-04-01 ENCOUNTER — Ambulatory Visit: Payer: Medicaid Other | Admitting: Physical Therapy

## 2021-04-01 DIAGNOSIS — M545 Low back pain, unspecified: Secondary | ICD-10-CM

## 2021-04-01 NOTE — Therapy (Signed)
[facility] [address] [place], [place], [address] Phone: [phone]   Fax:  [phone]  Physical Therapy Treatment  Patient Details  Name: DEMONTRAY FRANTA MRN: [id number] Date of Birth: October 02, 1991 Referring Provider (PT): [name].   Encounter Date: 04/01/2021   PT End of Session - 04/01/21 0856    Visit Number 5    Number of Visits 12    Date for PT Re-Evaluation 04/07/21    PT Start Time 0815    PT Stop Time 0901    PT Time Calculation (min) 46 min    Activity Tolerance Patient tolerated treatment well    Behavior During Therapy [facility] for tasks assessed/performed           Past Medical History:  Diagnosis Date  . Asthma   . Depression   . Eosinophilic esophagitis 2020  . GERD (gastroesophageal reflux disease)   . Kidney stone     Past Surgical History:  Procedure Laterality Date  . BIOPSY  01/07/2020   Procedure: BIOPSY;  Surgeon: West Bali, MD;  [facility];  Service: Endoscopy;;  duodenum esophagus  . ESOPHAGOGASTRODUODENOSCOPY (EGD) WITH PROPOFOL N/A 10/03/2019   Procedure: ESOPHAGOGASTRODUODENOSCOPY (EGD) WITH PROPOFOL;  Surgeon: West Bali, MD; LA grade B esophagitis s/p biopsy and dilation, mild gastritis s/p biopsy, and mild duodenitis.  Stomach biopsy with mild chronic inflammation negative for H. pylori.  Esophageal biopsy with inflamed squamous mucosa with increased eosinophils consistent with eosinophilic esophagitis.   Marland Kitchen ESOPHAGOGASTRODUODENOSCOPY (EGD) WITH PROPOFOL N/A 01/07/2020   Procedure: ESOPHAGOGASTRODUODENOSCOPY (EGD) WITH PROPOFOL;  Surgeon: West Bali, MD;  Esophageal mucosal changes secondary to eosinophilic esophagitis s/p biopsy, normal stomach, normal examined duodenum s/p biopsied.  Esophageal biopsies with increased intraepithelial eosinophils.  Duodenal bulb biopsy with peptic duodenitis, second part of duodenum with benign small bowel mucosa.   Marland Kitchen SAVORY DILATION  N/A 10/03/2019   Procedure: SAVORY DILATION;  Surgeon: West Bali, MD;  [facility];  Service: Endoscopy;  Laterality: N/A;  15/16/17    There were no vitals filed for this visit.   Subjective Assessment - 04/01/21 0824    Subjective COVID-19 screen performed prior to patient entering clinic. Patient reported ongoing pain.    Pertinent History H/o LBP.    How long can you stand comfortably? 15 minutes.    How long can you walk comfortably? 15 minutes.    Diagnostic tests X-ray.    Patient Stated Goals Return to work.    Currently in Pain? Yes    Pain Score 5     Pain Location Back    Pain Orientation Right    Pain Descriptors / Indicators Discomfort    Pain Type Acute pain    Pain Onset 1 to 4 weeks ago    Pain Frequency Constant    Aggravating Factors  increased activity    Pain Relieving Factors rest                             [facility] Adult PT Treatment/Exercise - 04/01/21 0001      Lumbar Exercises: Stretches   Piriformis Stretch 3 reps;Right;30 seconds    Other Lumbar Stretch Exercise standing hip flexor stretch x2 each LE 20sec hold      Lumbar Exercises: Aerobic   Nustep L2 x24min UE/LE      Lumbar Exercises: Standing   Other Standing Lumbar Exercises latt pull and diagnols with  blue XTS 2x10    Other Standing Lumbar Exercises step outs with 2# ball 2x10      Lumbar Exercises: Supine   Bridge 10 reps    Straight Leg Raise 3 seconds   2x10 with bracing   Other Supine Lumbar Exercises grey ball with marching 2x20    Other Supine Lumbar Exercises marching with bracing 2x20                       PT Long Term Goals - 04/01/21 0912      PT LONG TERM GOAL #1   Title Independent with a HEP.    Baseline doing initial HEP and will need progression next week 04/01/21    Time 6    Period Weeks    Status On-going      PT LONG TERM GOAL #2   Title Stand 30 minutes with pain not > 2/10.    Baseline increased pain with  prolong standing 04/01/21    Time 6    Period Weeks    Status On-going      PT LONG TERM GOAL #3   Title Perform ADL's with pain not > 2/10.    Baseline limited with ADL's    Time 6    Period Weeks    Status On-going                 Plan - 04/01/21 0914    Clinical Impression Statement Patient tolerated treatment well today. Today patient able to progress with core /abdominal bracing progression. Patient has been doing initial HEP and will need progression next week. Patient limited with ADL's and prolong standing due to pain in low back and more in right lowback/hip area. Re educated on posture awareness techniques to improve functional independence. Patient current goals progressing due to pain deficts.    Personal Factors and Comorbidities Comorbidity 1;Other    Comorbidities H/o LBP.    Examination-Activity Limitations Other;Transfers;Locomotion Level    Examination-Participation Restrictions Other    Stability/Clinical Decision Making Evolving/Moderate complexity    Rehab Potential Excellent    PT Frequency 2x / week    PT Duration 6 weeks    PT Treatment/Interventions ADLs/Self Care Home Management;Cryotherapy;Ultrasound;Functional mobility training;Therapeutic activities;Therapeutic exercise;Manual techniques;Patient/family education;Passive range of motion    PT Next Visit Plan Core exercise progression/combo e'stim/US, STW/M PRN    Consulted and Agree with Plan of Care Patient           Patient will benefit from skilled therapeutic intervention in order to improve the following deficits and impairments:  Difficulty walking,Decreased activity tolerance,Pain,Decreased range of motion  Visit Diagnosis: Acute midline low back pain without sciatica     Problem List Patient Active Problem List   Diagnosis Date Noted  . Eosinophilic esophagitis   . Hematuria 12/25/2019  . Depression   . Dyspepsia 07/15/2019  . Dysphagia 07/15/2019    Hermelinda Dellen,  PTA 04/01/2021, 9:22 AM  [facility] [address] [place], [place], [address] Phone: [phone]   Fax:  [phone]  Name: SKYLUR FUSTON MRN: [id number] Date of Birth: [birth year]

## 2021-04-02 ENCOUNTER — Ambulatory Visit: Payer: Medicaid Other | Admitting: *Deleted

## 2021-04-02 ENCOUNTER — Encounter: Payer: Self-pay | Admitting: *Deleted

## 2021-04-02 ENCOUNTER — Other Ambulatory Visit: Payer: Self-pay

## 2021-04-02 DIAGNOSIS — M545 Low back pain, unspecified: Secondary | ICD-10-CM | POA: Diagnosis not present

## 2021-04-02 NOTE — Therapy (Signed)
[facility] [address] [place], [place], [address] Phone: [phone]   Fax:  [phone]  Physical Therapy Treatment  Patient Details  Name: Ernest Cochran MRN: [id number] Date of Birth: March 18, 1991 Referring Provider (PT): [name].   Encounter Date: 04/02/2021   PT End of Session - 04/02/21 0810    Visit Number 6    Number of Visits 12    Date for PT Re-Evaluation 04/07/21    PT Start Time 0815    PT Stop Time 0903    PT Time Calculation (min) 48 min           Past Medical History:  Diagnosis Date  . Asthma   . Depression   . Eosinophilic esophagitis 2020  . GERD (gastroesophageal reflux disease)   . Kidney stone     Past Surgical History:  Procedure Laterality Date  . BIOPSY  01/07/2020   Procedure: BIOPSY;  Surgeon: West Bali, MD;  [facility];  Service: Endoscopy;;  duodenum esophagus  . ESOPHAGOGASTRODUODENOSCOPY (EGD) WITH PROPOFOL N/A 10/03/2019   Procedure: ESOPHAGOGASTRODUODENOSCOPY (EGD) WITH PROPOFOL;  Surgeon: West Bali, MD; LA grade B esophagitis s/p biopsy and dilation, mild gastritis s/p biopsy, and mild duodenitis.  Stomach biopsy with mild chronic inflammation negative for H. pylori.  Esophageal biopsy with inflamed squamous mucosa with increased eosinophils consistent with eosinophilic esophagitis.   Marland Kitchen ESOPHAGOGASTRODUODENOSCOPY (EGD) WITH PROPOFOL N/A 01/07/2020   Procedure: ESOPHAGOGASTRODUODENOSCOPY (EGD) WITH PROPOFOL;  Surgeon: West Bali, MD;  Esophageal mucosal changes secondary to eosinophilic esophagitis s/p biopsy, normal stomach, normal examined duodenum s/p biopsied.  Esophageal biopsies with increased intraepithelial eosinophils.  Duodenal bulb biopsy with peptic duodenitis, second part of duodenum with benign small bowel mucosa.   Marland Kitchen SAVORY DILATION N/A 10/03/2019   Procedure: SAVORY DILATION;  Surgeon: West Bali, MD;  [facility];  Service:  Endoscopy;  Laterality: N/A;  15/16/17    There were no vitals filed for this visit.   Subjective Assessment - 04/02/21 0810    Subjective COVID-19 screen performed prior to patient entering clinic. Patient reported ongoing pain in LB. Sleeping better using pillow on side to keep neutral spine. Lying prone 3-4 x daily in floor has really helped    Pertinent History H/o LBP.    How long can you stand comfortably? 15 minutes.    How long can you walk comfortably? 15 minutes.    Diagnostic tests X-ray.    Patient Stated Goals Return to work.    Currently in Pain? 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      Exercises   Exercises Lumbar      Lumbar Exercises: Aerobic   Tread Mill 2.3 MPH x 10 mins with light AB bracing.      Lumbar Exercises: Standing   Other Standing Lumbar Exercises --      Lumbar Exercises: Seated   Other Seated  Lumbar Exercises Chair squats x10 with AB bracing      Lumbar Exercises: Supine   Other Supine Lumbar Exercises Mcgill curlup x 6 hold 10 secs RT leg straight    Other Supine Lumbar Exercises --      Lumbar Exercises: Sidelying   Hip Abduction Both   x6 each side with 10 sec holds with AB bracing     Lumbar Exercises: Quadruped   Madcat/Old Horse 5 reps    Single Arm Raise Right;Left;5 reps;5 seconds   with AB bracing                      PT Long Term Goals - 04/01/21 0912      PT LONG TERM GOAL #1   Title Independent with a HEP.    Baseline doing initial HEP and will need progression next week 04/01/21    Time 6    Period Weeks    Status On-going      PT LONG TERM  GOAL #2   Title Stand 30 minutes with pain not > 2/10.    Baseline increased pain with prolong standing 04/01/21    Time 6    Period Weeks    Status On-going      PT LONG TERM GOAL #3   Title Perform ADL's with pain not > 2/10.    Baseline limited with ADL's    Time 6    Period Weeks    Status On-going                 Plan - 04/02/21 0936    Clinical Impression Statement Pt reports doing better with sleeping now and has been lying prone 3-4 x daily for spinal decompression and no pain. He was able to progress with some core exs, but modified bird/dog with just arm raise due to pain with leg raise.    Personal Factors and Comorbidities Comorbidity 1;Other    Comorbidities H/o LBP.    Examination-Activity Limitations Other;Transfers;Locomotion Level    Stability/Clinical Decision Making Evolving/Moderate complexity    Rehab Potential Excellent    PT Frequency 2x / week    PT Duration 6 weeks    PT Treatment/Interventions ADLs/Self Care Home Management;Functional mobility training;Therapeutic activities;Therapeutic exercise;Manual techniques;Patient/family education;Passive range of motion    PT Next Visit Plan Continue with progression of core exs and body mechanics.    Consulted and Agree with Plan of Care Patient           Patient will benefit from skilled therapeutic intervention in order to improve the following deficits and impairments:  Difficulty walking,Decreased activity tolerance,Pain,Decreased range of motion  Visit Diagnosis: Acute midline low back pain without sciatica     Problem List Patient Active Problem List   Diagnosis Date Noted  . Eosinophilic esophagitis   . Hematuria 12/25/2019  . Depression   . Dyspepsia 07/15/2019  . Dysphagia 07/15/2019    Demetres Prochnow,CHRIS, PTA 04/02/2021, 9:51 AM  [facility] [address] [place], [place], [address] Phone: [phone]   Fax:  [phone]  Name: Ernest Cochran MRN: [id number] Date of Birth: Apr 03, 1991

## 2021-04-06 ENCOUNTER — Other Ambulatory Visit: Payer: Self-pay | Admitting: Family Medicine

## 2021-04-06 ENCOUNTER — Encounter: Payer: Self-pay | Admitting: Family Medicine

## 2021-04-06 ENCOUNTER — Ambulatory Visit: Payer: Medicaid Other | Admitting: Family Medicine

## 2021-04-06 VITALS — BP 118/78 | HR 86 | Temp 98.0°F | Ht 70.0 in | Wt 247.8 lb

## 2021-04-06 DIAGNOSIS — M5441 Lumbago with sciatica, right side: Secondary | ICD-10-CM | POA: Diagnosis not present

## 2021-04-06 DIAGNOSIS — F419 Anxiety disorder, unspecified: Secondary | ICD-10-CM

## 2021-04-06 DIAGNOSIS — F3341 Major depressive disorder, recurrent, in partial remission: Secondary | ICD-10-CM

## 2021-04-06 MED ORDER — VENLAFAXINE HCL ER 75 MG PO CP24: 75.0000 mg | ORAL_CAPSULE | Freq: Every day | ORAL | 2 refills | Status: DC

## 2021-04-06 NOTE — Progress Notes (Signed)
Assessment & Plan:  1. Acute midline low back pain with right-sided sciatica Improving in the low back but not the sciatica.  Continue physical therapy. - Ambulatory referral to Neurosurgery - MR Lumbar Spine Wo Contrast; Future  2. Recurrent major depressive disorder, in partial remission (HCC) Uncontrolled.  Effexor increased from 37.5 mg to 75 mg once daily. - venlafaxine XR (EFFEXOR XR) 75 MG 24 hr capsule; Take 1 capsule (75 mg total) by mouth daily.  Dispense: 30 capsule; Refill: 2   Return in about 6 weeks (around 05/18/2021) for annual physical & f/u depression/anxiety.  Ernest Boston, MSN, APRN, FNP-C [facility]  Subjective:    Patient ID: Ernest Cochran, male    DOB: 06-May-1991, 30 y.o.   MRN: [id number]  Patient Care Team: Gwenlyn Fudge, FNP as PCP - General (Family Medicine) West Bali, MD (Inactive) as Consulting Physician (Gastroenterology)   Chief Complaint:  Chief Complaint  Patient presents with  . Back Pain    6 week re check. Patient states that his back pain is better but he is having more burning in right hip that goes down his leg.     HPI: Ernest Cochran is a 30 y.o. male presenting on 04/06/2021 for Back Pain (6 week re check. Patient states that his back pain is better but he is having more burning in right hip that goes down his leg. )  Patient is following up on his low back pain.  He states it is getting better besides the burning pain that goes down his right leg.  This pain has been present from the beginning but is more noticeable now since he is not so focused on his back.  The pain occurs as soon as he stands up and is constant with any standing or walking.  He has been doing physical therapy and will continue to do so.  New complaints: Patient does feel his depression has gotten worse.  Depression screen [facility] 2/9 04/06/2021 02/12/2020 12/24/2019  Decreased Interest 1 0 1  Down, Depressed, Hopeless 1 1 2   PHQ - 2  Score 2 1 3   Altered sleeping 2 0 3  Tired, decreased energy 1 0 2  Change in appetite 3 1 3   Feeling bad or failure about yourself  1 0 3  Trouble concentrating 1 1 2   Moving slowly or fidgety/restless 0 3 0  Suicidal thoughts 0 0 1  PHQ-9 Score 10 6 17   Difficult doing work/chores Not difficult at all Not difficult at all Somewhat difficult   GAD 7 : Generalized Anxiety Score 04/06/2021  Nervous, Anxious, on Edge 1  Control/stop worrying 1  Worry too much - different things 1  Trouble relaxing 1  Restless 0  Easily annoyed or irritable 2  Afraid - awful might happen 1  Total GAD 7 Score 7  Anxiety Difficulty Not difficult at all    Social history:  Relevant past medical, surgical, family and social history reviewed and updated as indicated. Interim medical history since our last visit reviewed.  Allergies and medications reviewed and updated.  DATA REVIEWED: CHART IN EPIC  ROS: Negative unless specifically indicated above in HPI.    Current Outpatient Medications:  .  fluticasone (FLOVENT HFA) 220 MCG/ACT inhaler, 2 PUFFs INTO BACK OF THROAT AND THEN SWALLOW TWICE A DAY FOR 8 WEEKS., Disp: 1 Inhaler, Rfl: 2 .  hydrOXYzine (ATARAX/VISTARIL) 25 MG tablet, Take 1 tablet (25 mg total) by mouth 3 (three)  times daily as needed for anxiety., Disp: 30 tablet, Rfl: 2 .  ibuprofen (ADVIL) 600 MG tablet, Take 1 tablet (600 mg total) by mouth every 6 (six) hours as needed., Disp: 30 tablet, Rfl: 0 .  venlafaxine XR (EFFEXOR XR) 37.5 MG 24 hr capsule, Take 1 capsule (37.5 mg total) by mouth daily., Disp: 90 capsule, Rfl: 2   No Known Allergies Past Medical History:  Diagnosis Date  . Asthma   . Depression   . Eosinophilic esophagitis 2020  . GERD (gastroesophageal reflux disease)   . Kidney stone     Past Surgical History:  Procedure Laterality Date  . BIOPSY  01/07/2020   Procedure: BIOPSY;  Surgeon: West Bali, MD;  [facility];  Service: Endoscopy;;   duodenum esophagus  . ESOPHAGOGASTRODUODENOSCOPY (EGD) WITH PROPOFOL N/A 10/03/2019   Procedure: ESOPHAGOGASTRODUODENOSCOPY (EGD) WITH PROPOFOL;  Surgeon: West Bali, MD; LA grade B esophagitis s/p biopsy and dilation, mild gastritis s/p biopsy, and mild duodenitis.  Stomach biopsy with mild chronic inflammation negative for H. pylori.  Esophageal biopsy with inflamed squamous mucosa with increased eosinophils consistent with eosinophilic esophagitis.   Marland Kitchen ESOPHAGOGASTRODUODENOSCOPY (EGD) WITH PROPOFOL N/A 01/07/2020   Procedure: ESOPHAGOGASTRODUODENOSCOPY (EGD) WITH PROPOFOL;  Surgeon: West Bali, MD;  Esophageal mucosal changes secondary to eosinophilic esophagitis s/p biopsy, normal stomach, normal examined duodenum s/p biopsied.  Esophageal biopsies with increased intraepithelial eosinophils.  Duodenal bulb biopsy with peptic duodenitis, second part of duodenum with benign small bowel mucosa.   Marland Kitchen SAVORY DILATION N/A 10/03/2019   Procedure: SAVORY DILATION;  Surgeon: West Bali, MD;  [facility];  Service: Endoscopy;  Laterality: N/A;  15/16/17    Social History   Socioeconomic History  . Marital status: Married    Spouse name: Not on file  . Number of children: Not on file  . Years of education: Not on file  . Highest education level: Not on file  Occupational History  . Not on file  Tobacco Use  . Smoking status: Former Smoker    Types: Cigarettes  . Smokeless tobacco: Never Used  Vaping Use  . Vaping Use: Never used  Substance and Sexual Activity  . Alcohol use: Not Currently    Comment: occasional. 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   . Drug use: Not Currently    Types: Marijuana    Comment: denied 07/15/19, stopped 3-4 months ago.   Marland Kitchen Sexual activity: Not on file  Other Topics Concern  . Not on file  Social History Narrative  . Not on file   Social Determinants of Health   Financial Resource Strain: Not on file  Food  Insecurity: Not on file  Transportation Needs: Not on file  Physical Activity: Not on file  Stress: Not on file  Social Connections: Not on file  Intimate Partner Violence: Not on file        Objective:    BP 118/78   Pulse 86   Temp 98 F (36.7 C) (Temporal)   Ht 5\' 10"  (1.778 m)   Wt 247 lb 12.8 oz (112.4 kg)   SpO2 95%   BMI 35.56 kg/m   Wt Readings from Last 3 Encounters:  04/06/21 247 lb 12.8 oz (112.4 kg)  02/23/21 244 lb (110.7 kg)  02/16/21 248 lb 9.6 oz (112.8 kg)    Physical Exam Vitals reviewed.  Constitutional:      General: He is not in acute distress.  Appearance: Normal appearance. He is obese. He is not ill-appearing, toxic-appearing or diaphoretic.  HENT:     Head: Normocephalic and atraumatic.  Eyes:     General: No scleral icterus.       Right eye: No discharge.        Left eye: No discharge.     Conjunctiva/sclera: Conjunctivae normal.  Cardiovascular:     Rate and Rhythm: Normal rate and regular rhythm.     Heart sounds: Normal heart sounds. No murmur heard. No friction rub. No gallop.   Pulmonary:     Effort: Pulmonary effort is normal. No respiratory distress.     Breath sounds: Normal breath sounds. No stridor. No wheezing, rhonchi or rales.  Musculoskeletal:        General: Normal range of motion.     Cervical back: Normal range of motion.  Skin:    General: Skin is warm and dry.  Neurological:     Mental Status: He is alert and oriented to person, place, and time. Mental status is at baseline.  Psychiatric:        Mood and Affect: Mood normal.        Behavior: Behavior normal.        Thought Content: Thought content normal.        Judgment: Judgment normal.     No results found for: TSH Lab Results  Component Value Date   WBC 15.6 (H) 01/01/2020   HGB 15.3 01/01/2020   HCT 46.4 01/01/2020   MCV 88.0 01/01/2020   PLT 271 01/01/2020   Lab Results  Component Value Date   NA 139 01/01/2020   K 3.6 01/01/2020   CO2 24  01/01/2020   GLUCOSE 134 (H) 01/01/2020   BUN 16 01/01/2020   CREATININE 1.22 01/01/2020   BILITOT 0.6 01/01/2020   ALKPHOS 93 01/01/2020   AST 26 01/01/2020   ALT 32 01/01/2020   PROT 8.1 01/01/2020   ALBUMIN 4.4 01/01/2020   CALCIUM 9.5 01/01/2020   ANIONGAP 12 01/01/2020   No results found for: CHOL No results found for: HDL No results found for: LDLCALC No results found for: TRIG No results found for: CHOLHDL No results found for: MVEH2C

## 2021-04-07 ENCOUNTER — Other Ambulatory Visit: Payer: Self-pay

## 2021-04-07 ENCOUNTER — Ambulatory Visit: Payer: Medicaid Other | Attending: Family Medicine | Admitting: *Deleted

## 2021-04-07 DIAGNOSIS — M545 Low back pain, unspecified: Secondary | ICD-10-CM | POA: Insufficient documentation

## 2021-04-07 NOTE — Therapy (Signed)
[facility] [address] [place], [place], [address] Phone: [phone]   Fax:  [phone]  Physical Therapy Treatment  Patient Details  Name: Ernest Cochran MRN: [id number] Date of Birth: [birth year] Referring Provider (PT): [name].   Encounter Date: 04/07/2021   PT End of Session - 04/07/21 1201    Visit Number 7    Number of Visits 12    Date for PT Re-Evaluation 04/07/21    PT Start Time 0815    PT Stop Time 0900    PT Time Calculation (min) 45 min           Past Medical History:  Diagnosis Date  . Asthma   . Depression   . Eosinophilic esophagitis 2020  . GERD (gastroesophageal reflux disease)   . Kidney stone     Past Surgical History:  Procedure Laterality Date  . BIOPSY  01/07/2020   Procedure: BIOPSY;  Surgeon: West Bali, MD;  [facility];  Service: Endoscopy;;  duodenum esophagus  . ESOPHAGOGASTRODUODENOSCOPY (EGD) WITH PROPOFOL N/A 10/03/2019   Procedure: ESOPHAGOGASTRODUODENOSCOPY (EGD) WITH PROPOFOL;  Surgeon: West Bali, MD; LA grade B esophagitis s/p biopsy and dilation, mild gastritis s/p biopsy, and mild duodenitis.  Stomach biopsy with mild chronic inflammation negative for H. pylori.  Esophageal biopsy with inflamed squamous mucosa with increased eosinophils consistent with eosinophilic esophagitis.   Marland Kitchen ESOPHAGOGASTRODUODENOSCOPY (EGD) WITH PROPOFOL N/A 01/07/2020   Procedure: ESOPHAGOGASTRODUODENOSCOPY (EGD) WITH PROPOFOL;  Surgeon: West Bali, MD;  Esophageal mucosal changes secondary to eosinophilic esophagitis s/p biopsy, normal stomach, normal examined duodenum s/p biopsied.  Esophageal biopsies with increased intraepithelial eosinophils.  Duodenal bulb biopsy with peptic duodenitis, second part of duodenum with benign small bowel mucosa.   Marland Kitchen SAVORY DILATION N/A 10/03/2019   Procedure: SAVORY DILATION;  Surgeon: West Bali, MD;  [facility];  Service:  Endoscopy;  Laterality: N/A;  15/16/17    There were no vitals filed for this visit.   Subjective Assessment - 04/07/21 0913    Subjective COVID-19 screen performed prior to patient entering clinic.Not doing well today with constant pain in RT LE down to calf for 3-4 days    Pertinent History H/o LBP.    How long can you stand comfortably? 15 minutes.    How long can you walk comfortably? 15 minutes.    Diagnostic tests X-ray.    Patient Stated Goals Return to work.    Currently in Pain? Yes    Pain Score 7     Pain Location Back    Pain Orientation Right    Pain Descriptors / Indicators Discomfort    Pain Type Acute pain    Pain Onset 1 to 4 weeks ago                             [facility] Adult PT Treatment/Exercise - 04/07/21 0001      Posture/Postural Control   Posture Comments seated posture using towel roll to maintain lordodic curve      Exercises   Exercises Lumbar      Lumbar Exercises: Standing   Other Standing Lumbar Exercises EIS x 5 with some mild increased pain      Lumbar Exercises: Prone   Other Prone Lumbar Exercises Prone over 2 pillows x 5 mins decreased pain, Prone over one pillow x 5 mins decreased pain,prone position x 5 mins no pain RT LE, prone  on elbows x 5 mins No RT LE symptoms                  HEP discussed and handout given             PT Long Term Goals - 04/01/21 0912      PT LONG TERM GOAL #1   Title Independent with a HEP.    Baseline doing initial HEP and will need progression next week 04/01/21    Time 6    Period Weeks    Status On-going      PT LONG TERM GOAL #2   Title Stand 30 minutes with pain not > 2/10.    Baseline increased pain with prolong standing 04/01/21    Time 6    Period Weeks    Status On-going      PT LONG TERM GOAL #3   Title Perform ADL's with pain not > 2/10.    Baseline limited with ADL's    Time 6    Period Weeks    Status On-going                 Plan - 04/07/21  1201    Clinical Impression Statement Pt arrived today not doing well due to Radiating pain into RT LE for 3-4 days. Rx focused on prone progression starting on 2 pillows. After progression Pt was able to centralize disc and eliminate RT LE pain. Prone progression given for HEP and towel roll for sitting performed/ discussed.    Personal Factors and Comorbidities Comorbidity 1;Other    Comorbidities H/o LBP.    Examination-Activity Limitations Other;Transfers;Locomotion Level    Examination-Participation Restrictions Other    Stability/Clinical Decision Making Evolving/Moderate complexity    Rehab Potential Excellent    PT Frequency 2x / week    PT Treatment/Interventions ADLs/Self Care Home Management;Functional mobility training;Therapeutic activities;Therapeutic exercise;Manual techniques;Patient/family education;Passive range of motion    PT Next Visit Plan Continue with progression of core exs and body mechanics.    Consulted and Agree with Plan of Care Patient           Patient will benefit from skilled therapeutic intervention in order to improve the following deficits and impairments:  Difficulty walking,Decreased activity tolerance,Pain,Decreased range of motion  Visit Diagnosis: Acute midline low back pain without sciatica     Problem List Patient Active Problem List   Diagnosis Date Noted  . Eosinophilic esophagitis   . Hematuria 12/25/2019  . Depression   . Dyspepsia 07/15/2019  . Dysphagia 07/15/2019    Breck Maryland,CHRIS, PTA 04/07/2021, 12:08 PM  [facility] [address] [place], [place], [address] Phone: [phone]   Fax:  [phone]  Name: Ernest Cochran MRN: [id number] Date of Birth: July 08, 1991

## 2021-04-09 ENCOUNTER — Ambulatory Visit: Payer: Medicaid Other | Admitting: *Deleted

## 2021-04-09 ENCOUNTER — Other Ambulatory Visit: Payer: Self-pay

## 2021-04-09 DIAGNOSIS — M545 Low back pain, unspecified: Secondary | ICD-10-CM

## 2021-04-09 NOTE — Therapy (Signed)
[facility] [address] [place], [place], [address] Phone: [phone]   Fax:  [phone]  Physical Therapy Treatment  Patient Details  Name: Ernest Cochran MRN: [id number] Date of Birth: Dec 15, 1990 Referring Provider (PT): [name].   Encounter Date: 04/09/2021   PT End of Session - 04/09/21 1042    Visit Number 8    Number of Visits 12    Date for PT Re-Evaluation 04/07/21    PT Start Time 0900    PT Stop Time 0946    PT Time Calculation (min) 46 min           Past Medical History:  Diagnosis Date  . Asthma   . Depression   . Eosinophilic esophagitis 2020  . GERD (gastroesophageal reflux disease)   . Kidney stone     Past Surgical History:  Procedure Laterality Date  . BIOPSY  01/07/2020   Procedure: BIOPSY;  Surgeon: West Bali, MD;  [facility];  Service: Endoscopy;;  duodenum esophagus  . ESOPHAGOGASTRODUODENOSCOPY (EGD) WITH PROPOFOL N/A 10/03/2019   Procedure: ESOPHAGOGASTRODUODENOSCOPY (EGD) WITH PROPOFOL;  Surgeon: West Bali, MD; LA grade B esophagitis s/p biopsy and dilation, mild gastritis s/p biopsy, and mild duodenitis.  Stomach biopsy with mild chronic inflammation negative for H. pylori.  Esophageal biopsy with inflamed squamous mucosa with increased eosinophils consistent with eosinophilic esophagitis.   Marland Kitchen ESOPHAGOGASTRODUODENOSCOPY (EGD) WITH PROPOFOL N/A 01/07/2020   Procedure: ESOPHAGOGASTRODUODENOSCOPY (EGD) WITH PROPOFOL;  Surgeon: West Bali, MD;  Esophageal mucosal changes secondary to eosinophilic esophagitis s/p biopsy, normal stomach, normal examined duodenum s/p biopsied.  Esophageal biopsies with increased intraepithelial eosinophils.  Duodenal bulb biopsy with peptic duodenitis, second part of duodenum with benign small bowel mucosa.   Marland Kitchen SAVORY DILATION N/A 10/03/2019   Procedure: SAVORY DILATION;  Surgeon: West Bali, MD;  [facility];  Service:  Endoscopy;  Laterality: N/A;  15/16/17    There were no vitals filed for this visit.   Subjective Assessment - 04/09/21 0914    Subjective COVID-19 screen performed prior to patient entering clinic. LB pain 2/10, No RT LE pain.    Pertinent History H/o LBP.    How long can you stand comfortably? 15 minutes.    How long can you walk comfortably? 15 minutes.    Diagnostic tests X-ray.    Patient Stated Goals Return to work.    Currently in Pain? Yes    Pain Score 2     Pain Location Back    Pain Orientation Right    Pain Descriptors / Indicators Discomfort                             [facility] Adult PT Treatment/Exercise - 04/09/21 0001      Exercises   Exercises Lumbar      Lumbar Exercises: Aerobic   Nustep L2 x44min UE/LE      Lumbar Exercises: Standing   Row Strengthening;Both;20 reps   2x10 with each foot forward   Shoulder Extension Strengthening;Both   XTS blue 2x10   Other Standing Lumbar Exercises modified bird dog at counter with leg raise 2x 10 hold 5 secs      Lumbar Exercises: Supine   Other Supine Lumbar Exercises Mcgill curlup x 6 hold 10 secs RT leg straight      Lumbar Exercises: Sidelying   Hip Abduction Both   x6 each side with 10 sec  holds with AB bracing     Lumbar Exercises: Quadruped   Single Arm Raise Right;Left;5 seconds;10 reps   with AB bracing                      PT Long Term Goals - 04/01/21 0912      PT LONG TERM GOAL #1   Title Independent with a HEP.    Baseline doing initial HEP and will need progression next week 04/01/21    Time 6    Period Weeks    Status On-going      PT LONG TERM GOAL #2   Title Stand 30 minutes with pain not > 2/10.    Baseline increased pain with prolong standing 04/01/21    Time 6    Period Weeks    Status On-going      PT LONG TERM GOAL #3   Title Perform ADL's with pain not > 2/10.    Baseline limited with ADL's    Time 6    Period Weeks    Status On-going                  Plan - 04/09/21 1043    Clinical Impression Statement Pt doing much better with pain mainly in LB and no radicular pain RT LE. Rx focused on core progression    Examination-Participation Restrictions Other    Stability/Clinical Decision Making Evolving/Moderate complexity    Rehab Potential Excellent    PT Frequency 2x / week    PT Treatment/Interventions ADLs/Self Care Home Management;Functional mobility training;Therapeutic activities;Therapeutic exercise;Manual techniques;Patient/family education;Passive range of motion    PT Next Visit Plan Continue with progression of core exs and body mechanics.    Consulted and Agree with Plan of Care Patient           Patient will benefit from skilled therapeutic intervention in order to improve the following deficits and impairments:  Difficulty walking,Decreased activity tolerance,Pain,Decreased range of motion  Visit Diagnosis: Acute midline low back pain without sciatica     Problem List Patient Active Problem List   Diagnosis Date Noted  . Eosinophilic esophagitis   . Hematuria 12/25/2019  . Depression   . Dyspepsia 07/15/2019  . Dysphagia 07/15/2019    Jaz Laningham,CHRIS, PTA 04/09/2021, 10:45 AM  [facility] [address] [place], [place], [address] Phone: [phone]   Fax:  [phone]  Name: Ernest Cochran MRN: [id number] Date of Birth: Feb 26, 1991

## 2021-04-13 ENCOUNTER — Telehealth: Payer: Self-pay | Admitting: Family Medicine

## 2021-04-14 ENCOUNTER — Other Ambulatory Visit: Payer: Self-pay

## 2021-04-14 ENCOUNTER — Encounter: Payer: Self-pay | Admitting: Physical Therapy

## 2021-04-14 ENCOUNTER — Ambulatory Visit: Payer: Medicaid Other | Admitting: Physical Therapy

## 2021-04-14 DIAGNOSIS — M545 Low back pain, unspecified: Secondary | ICD-10-CM | POA: Diagnosis not present

## 2021-04-14 NOTE — Therapy (Signed)
[facility] [address] [place], [place], [address] Phone: [phone]   Fax:  [phone]  Physical Therapy Treatment  Patient Details  Name: Ernest Cochran MRN: [id number] Date of Birth: [birth year] Referring Provider (PT): [name].   Encounter Date: 04/14/2021   PT End of Session - 04/14/21 1434    Visit Number 9    Number of Visits 12    Date for PT Re-Evaluation 04/07/21    PT Start Time 1432    PT Stop Time 1512    PT Time Calculation (min) 40 min    Activity Tolerance Patient tolerated treatment well    Behavior During Therapy [facility] for tasks assessed/performed           Past Medical History:  Diagnosis Date  . Asthma   . Depression   . Eosinophilic esophagitis 2020  . GERD (gastroesophageal reflux disease)   . Kidney stone     Past Surgical History:  Procedure Laterality Date  . BIOPSY  01/07/2020   Procedure: BIOPSY;  Surgeon: West Bali, MD;  [facility];  Service: Endoscopy;;  duodenum esophagus  . ESOPHAGOGASTRODUODENOSCOPY (EGD) WITH PROPOFOL N/A 10/03/2019   Procedure: ESOPHAGOGASTRODUODENOSCOPY (EGD) WITH PROPOFOL;  Surgeon: West Bali, MD; LA grade B esophagitis s/p biopsy and dilation, mild gastritis s/p biopsy, and mild duodenitis.  Stomach biopsy with mild chronic inflammation negative for H. pylori.  Esophageal biopsy with inflamed squamous mucosa with increased eosinophils consistent with eosinophilic esophagitis.   Marland Kitchen ESOPHAGOGASTRODUODENOSCOPY (EGD) WITH PROPOFOL N/A 01/07/2020   Procedure: ESOPHAGOGASTRODUODENOSCOPY (EGD) WITH PROPOFOL;  Surgeon: West Bali, MD;  Esophageal mucosal changes secondary to eosinophilic esophagitis s/p biopsy, normal stomach, normal examined duodenum s/p biopsied.  Esophageal biopsies with increased intraepithelial eosinophils.  Duodenal bulb biopsy with peptic duodenitis, second part of duodenum with benign small bowel mucosa.   Marland Kitchen SAVORY DILATION  N/A 10/03/2019   Procedure: SAVORY DILATION;  Surgeon: West Bali, MD;  [facility];  Service: Endoscopy;  Laterality: N/A;  15/16/17    There were no vitals filed for this visit.   Subjective Assessment - 04/14/21 1433    Subjective COVID-19 screen performed prior to patient entering clinic. Reports being pretty good today with more tightness in low back than pain. Patient reports that he has returned to work with no pain or issues. Patient reports he is very careful with movement but his coworkers help out.    Pertinent History H/o LBP.    How long can you stand comfortably? 15 minutes.    How long can you walk comfortably? 15 minutes.    Diagnostic tests X-ray.    Patient Stated Goals Return to work.    Currently in Pain? Yes    Pain Score 2     Pain Location Back    Pain Orientation Lower    Pain Descriptors / Indicators Tightness    Pain Type Acute pain    Pain Onset More than a month ago    Pain Frequency Constant              [facility] PT Assessment - 04/14/21 0001      Assessment   Medical Diagnosis Acute midline low back pain.    Referring Provider (PT) [name].    Next MD Visit 05/19/2021   MRI scheduled for 04/26/2021     Precautions   Precautions None      Restrictions   Weight Bearing Restrictions No  [facility] Adult PT Treatment/Exercise - 04/14/21 0001      Lumbar Exercises: Aerobic   Tread Mill 2.3 MPH x 10 mins with light AB bracing.      Lumbar Exercises: Standing   Row Strengthening;Both;20 reps   Blue XTS   Shoulder Extension Strengthening;Both;20 reps   Blue XTS   Other Standing Lumbar Exercises B hip abduction x20 reps with ab bracing    Other Standing Lumbar Exercises B chop/lift with bracing blue XTS x20 rpes      Lumbar Exercises: Seated   Sit to Stand 10 reps    Sit to Stand Limitations posture      Lumbar Exercises: Supine   Other Supine Lumbar Exercises Mcgill curlup x 6 hold 10  secs RT leg straight      Lumbar Exercises: Sidelying   Clam Both;20 reps;2 seconds      Lumbar Exercises: Quadruped   Single Arm Raise Right;Left;10 reps    Single Arm Raises Limitations modified plank in standing    Straight Leg Raise 10 reps;Limitations    Straight Leg Raises Limitations modified plank in standing    Opposite Arm/Leg Raise Right arm/Left leg;Left arm/Right leg;10 reps    Opposite Arm/Leg Raise Limitations modified plank in standing    Plank 2 modified plank standing elbows extended 2x30 sec, elbow flex 1x30 sec                       PT Long Term Goals - 04/01/21 0912      PT LONG TERM GOAL #1   Title Independent with a HEP.    Baseline doing initial HEP and will need progression next week 04/01/21    Time 6    Period Weeks    Status On-going      PT LONG TERM GOAL #2   Title Stand 30 minutes with pain not > 2/10.    Baseline increased pain with prolong standing 04/01/21    Time 6    Period Weeks    Status On-going      PT LONG TERM GOAL #3   Title Perform ADL's with pain not > 2/10.    Baseline limited with ADL's    Time 6    Period Weeks    Status On-going                 Plan - 04/14/21 1513    Clinical Impression Statement Patient presented in clinic with minimal tightness of low back upon arrival. Patient progressed through more core training with introduction to modified planks today as well. No complaints of any increased pain during treatment. Patient required minimal intermittant VCs and demo for technique. No complaints of any increased pain following treatment.    Personal Factors and Comorbidities Comorbidity 1;Other    Comorbidities H/o LBP.    Examination-Activity Limitations Other;Transfers;Locomotion Level    Examination-Participation Restrictions Other    Stability/Clinical Decision Making Evolving/Moderate complexity    Rehab Potential Excellent    PT Frequency 2x / week    PT Duration 6 weeks    PT  Treatment/Interventions ADLs/Self Care Home Management;Functional mobility training;Therapeutic activities;Therapeutic exercise;Manual techniques;Patient/family education;Passive range of motion    PT Next Visit Plan Continue with progression of core exs and body mechanics.    Consulted and Agree with Plan of Care Patient           Patient will benefit from skilled therapeutic intervention in order to improve the following deficits and impairments:  Difficulty walking,Decreased activity tolerance,Pain,Decreased range of motion  Visit Diagnosis: Acute midline low back pain without sciatica     Problem List Patient Active Problem List   Diagnosis Date Noted  . Eosinophilic esophagitis   . Hematuria 12/25/2019  . Depression   . Dyspepsia 07/15/2019  . Dysphagia 07/15/2019    Ernest Cochran, Ernest Cochran 04/14/2021, 4:43 PM  [facility] [address] [place], [place], [address] Phone: [phone]   Fax:  [phone]  Name: Ernest Cochran MRN: [id number] Date of Birth: 1991/08/18

## 2021-04-16 ENCOUNTER — Ambulatory Visit: Payer: Medicaid Other | Admitting: *Deleted

## 2021-04-16 ENCOUNTER — Other Ambulatory Visit: Payer: Self-pay

## 2021-04-16 DIAGNOSIS — M545 Low back pain, unspecified: Secondary | ICD-10-CM

## 2021-04-16 NOTE — Therapy (Signed)
[facility] [address] [place], [place], [address] Phone: [phone]   Fax:  [phone]  Physical Therapy Treatment  Patient Details  Name: Ernest Cochran MRN: [id number] Date of Birth: 19-Feb-1991 Referring Provider (PT): [name].   Encounter Date: 04/16/2021   PT End of Session - 04/16/21 0910    Visit Number 10    Number of Visits 12    Date for PT Re-Evaluation 04/07/21    PT Start Time 0815    PT Stop Time 0900    PT Time Calculation (min) 45 min           Past Medical History:  Diagnosis Date  . Asthma   . Depression   . Eosinophilic esophagitis 2020  . GERD (gastroesophageal reflux disease)   . Kidney stone     Past Surgical History:  Procedure Laterality Date  . BIOPSY  01/07/2020   Procedure: BIOPSY;  Surgeon: West Bali, MD;  [facility];  Service: Endoscopy;;  duodenum esophagus  . ESOPHAGOGASTRODUODENOSCOPY (EGD) WITH PROPOFOL N/A 10/03/2019   Procedure: ESOPHAGOGASTRODUODENOSCOPY (EGD) WITH PROPOFOL;  Surgeon: West Bali, MD; LA grade B esophagitis s/p biopsy and dilation, mild gastritis s/p biopsy, and mild duodenitis.  Stomach biopsy with mild chronic inflammation negative for H. pylori.  Esophageal biopsy with inflamed squamous mucosa with increased eosinophils consistent with eosinophilic esophagitis.   Marland Kitchen ESOPHAGOGASTRODUODENOSCOPY (EGD) WITH PROPOFOL N/A 01/07/2020   Procedure: ESOPHAGOGASTRODUODENOSCOPY (EGD) WITH PROPOFOL;  Surgeon: West Bali, MD;  Esophageal mucosal changes secondary to eosinophilic esophagitis s/p biopsy, normal stomach, normal examined duodenum s/p biopsied.  Esophageal biopsies with increased intraepithelial eosinophils.  Duodenal bulb biopsy with peptic duodenitis, second part of duodenum with benign small bowel mucosa.   Marland Kitchen SAVORY DILATION N/A 10/03/2019   Procedure: SAVORY DILATION;  Surgeon: West Bali, MD;  [facility];  Service:  Endoscopy;  Laterality: N/A;  15/16/17    There were no vitals filed for this visit.   Subjective Assessment - 04/16/21 0827    Subjective COVID-19 screen performed prior to patient entering clinic. 4/10 LBP today    Pertinent History H/o LBP.    How long can you stand comfortably? 15 minutes.    How long can you walk comfortably? 15 minutes.    Diagnostic tests X-ray.    Patient Stated Goals Return to work.    Currently in Pain? Yes    Pain Score 4     Pain Location Back    Pain Orientation Lower    Pain Descriptors / Indicators Tightness    Pain Type Acute pain                             [facility] Adult PT Treatment/Exercise - 04/16/21 0001      Exercises   Exercises Lumbar      Lumbar Exercises: Aerobic   Tread Mill 2.3 MPH x 10 mins with light AB bracing.      Lumbar Exercises: Standing   Shoulder Extension Strengthening;Both;10 reps   Blue XTS   hold 10 secs   Other Standing Lumbar Exercises modified bird dog at counter with leg raise x6,x4x2 each side hold 10 secs      Lumbar Exercises: Supine   Other Supine Lumbar Exercises Mcgill curlup x 6 ,x4,x2 hold 10 secs RT leg straight      Lumbar Exercises: Sidelying   Other Sidelying Lumbar Exercises side bridge  x 5 hold 5 secs              Cat/Camel   x10             PT Long Term Goals - 04/01/21 0912      PT LONG TERM GOAL #1   Title Independent with a HEP.    Baseline doing initial HEP and will need progression next week 04/01/21    Time 6    Period Weeks    Status On-going      PT LONG TERM GOAL #2   Title Stand 30 minutes with pain not > 2/10.    Baseline increased pain with prolong standing 04/01/21    Time 6    Period Weeks    Status On-going      PT LONG TERM GOAL #3   Title Perform ADL's with pain not > 2/10.    Baseline limited with ADL's    Time 6    Period Weeks    Status On-going                 Plan - 04/16/21 0914    Clinical Impression Statement Pt  arrived today doing fairly well with some LBP and RT leg systems, but did great with core/posture exs today with some corrections in technique, but less pain end of session    Personal Factors and Comorbidities Comorbidity 1;Other    Examination-Activity Limitations Other;Transfers;Locomotion Level    Stability/Clinical Decision Making Evolving/Moderate complexity    Rehab Potential Excellent    PT Frequency 2x / week    PT Next Visit Plan Continue with progression of core exs and body mechanics.    Consulted and Agree with Plan of Care Patient           Patient will benefit from skilled therapeutic intervention in order to improve the following deficits and impairments:     Visit Diagnosis: Acute midline low back pain without sciatica     Problem List Patient Active Problem List   Diagnosis Date Noted  . Eosinophilic esophagitis   . Hematuria 12/25/2019  . Depression   . Dyspepsia 07/15/2019  . Dysphagia 07/15/2019    Keelie Zemanek,CHRIS, PTA 04/16/2021, 9:17 AM  [facility] [address] [place], [place], [address] Phone: [phone]   Fax:  [phone]  Name: KELIJAH TOWRY MRN: [id number] Date of Birth: [birth year]

## 2021-04-19 ENCOUNTER — Other Ambulatory Visit: Payer: Self-pay

## 2021-04-19 ENCOUNTER — Ambulatory Visit: Payer: Medicaid Other | Admitting: Physical Therapy

## 2021-04-19 ENCOUNTER — Encounter: Payer: Self-pay | Admitting: Physical Therapy

## 2021-04-19 DIAGNOSIS — M545 Low back pain, unspecified: Secondary | ICD-10-CM

## 2021-04-19 NOTE — Therapy (Signed)
[facility] [address] [place], [place], [address] Phone: [phone]   Fax:  [phone]  Physical Therapy Treatment  Patient Details  Name: Ernest Cochran MRN: [id number] Date of Birth: 07-Sep-1991 Referring Provider (PT): Ernest Cochran.   Encounter Date: 04/19/2021   PT End of Session - 04/19/21 1600    Visit Number 11    Number of Visits 12    Date for PT Re-Evaluation 04/07/21    PT Start Time 1600    PT Stop Time 1630    PT Time Calculation (min) 30 min    Activity Tolerance Patient tolerated treatment well    Behavior During Therapy [facility] for tasks assessed/performed           Past Medical History:  Diagnosis Date  . Asthma   . Depression   . Eosinophilic esophagitis 2020  . GERD (gastroesophageal reflux disease)   . Kidney stone     Past Surgical History:  Procedure Laterality Date  . BIOPSY  01/07/2020   Procedure: BIOPSY;  Surgeon: West Bali, MD;  [facility];  Service: Endoscopy;;  duodenum esophagus  . ESOPHAGOGASTRODUODENOSCOPY (EGD) WITH PROPOFOL N/A 10/03/2019   Procedure: ESOPHAGOGASTRODUODENOSCOPY (EGD) WITH PROPOFOL;  Surgeon: West Bali, MD; LA grade B esophagitis s/p biopsy and dilation, mild gastritis s/p biopsy, and mild duodenitis.  Stomach biopsy with mild chronic inflammation negative for H. pylori.  Esophageal biopsy with inflamed squamous mucosa with increased eosinophils consistent with eosinophilic esophagitis.   Marland Kitchen ESOPHAGOGASTRODUODENOSCOPY (EGD) WITH PROPOFOL N/A 01/07/2020   Procedure: ESOPHAGOGASTRODUODENOSCOPY (EGD) WITH PROPOFOL;  Surgeon: West Bali, MD;  Esophageal mucosal changes secondary to eosinophilic esophagitis s/p biopsy, normal stomach, normal examined duodenum s/p biopsied.  Esophageal biopsies with increased intraepithelial eosinophils.  Duodenal bulb biopsy with peptic duodenitis, second part of duodenum with benign small bowel mucosa.   Marland Kitchen SAVORY DILATION  N/A 10/03/2019   Procedure: SAVORY DILATION;  Surgeon: West Bali, MD;  [facility];  Service: Endoscopy;  Laterality: N/A;  15/16/17    There were no vitals filed for this visit.   Subjective Assessment - 04/19/21 1559    Subjective COVID-19 screen performed prior to patient entering clinic. Reports that last night he began having more pain in R posterior LE to ankle. Had pain with standing and walking. Did not go to work today due to pain.    Pertinent History H/o LBP.    How long can you stand comfortably? 15 minutes.    How long can you walk comfortably? 15 minutes.    Diagnostic tests X-ray.    Patient Stated Goals Return to work.    Currently in Pain? Yes    Pain Score 5     Pain Location Back    Pain Orientation Right;Lower    Pain Descriptors / Indicators Discomfort    Pain Type Acute pain    Pain Radiating Towards RLE (8/10)    Pain Onset More than a month ago    Pain Frequency Constant              [facility] PT Assessment - 04/19/21 0001      Assessment   Medical Diagnosis Acute midline low back pain.    Referring Provider (PT) Ernest Cochran.    Next MD Visit 04/26/2021 MRI   04/29/2021 Nuerologist                        [facility] Adult PT  Treatment/Exercise - 04/19/21 0001      Lumbar Exercises: Stretches   Passive Hamstring Stretch Right;3 reps;30 seconds    Single Knee to Chest Stretch Right;3 reps;30 seconds    Lower Trunk Rotation 5 reps;10 seconds    Prone on Elbows Stretch 3 reps;10 seconds    Figure 4 Stretch 3 reps;60 seconds;Supine;Without overpressure    Figure 4 Stretch Limitations reported some relief in RLE symptoms    Other Lumbar Stretch Exercise RLE sciatic glide x3 rep   limited active knee extension d/t pain   Other Lumbar Stretch Exercise Prone lying x2 min, POE with R roadkill x2 min   no pain or symptoms in RLE with roadkill     Lumbar Exercises: Supine   Other Supine Lumbar Exercises Self massage with massage ball  in glute in supine x2 min                  PT Education - 04/19/21 1635    Education Details Figure 4 stretch, POE with roadkill    Person(s) Educated Patient    Methods Explanation;Handout    Comprehension Verbalized understanding               PT Long Term Goals - 04/19/21 1649      PT LONG TERM GOAL #1   Title Independent with a HEP.    Baseline doing initial HEP and will need progression next week 04/01/21    Time 6    Period Weeks    Status Achieved      PT LONG TERM GOAL #2   Title Stand 30 minutes with pain not > 2/10.    Baseline increased pain with prolong standing 04/01/21    Time 6    Period Weeks    Status On-going      PT LONG TERM GOAL #3   Title Perform ADL's with pain not > 2/10.    Baseline limited with ADL's    Time 6    Period Weeks    Status On-going                 Plan - 04/19/21 1636    Clinical Impression Statement Patient presented in clinic with guarded gait and increased pain in R low back and RLE. Patient unable to find relief with prone position since last night and had not been able to do HEP either due to pain. Patient guided through light stretching with greater relief after supine figure 4 stretch. Patient able to tolerate prone position progression after supine stretching. Patient reported initially pain in proximal HS region but less intensity as he relaxed. Patient denied any symptoms with POE with R roadkill. Patient also educated regarding self massage with tennis ball in supine or in sitting. Patient provided new HEP with verbalizing understanding of HEP instructions and parameters. Patient educated to remove wallet from back pocket while sitting in the car.    Personal Factors and Comorbidities Comorbidity 1;Other    Comorbidities H/o LBP.    Examination-Activity Limitations Other;Transfers;Locomotion Level    Examination-Participation Restrictions Other    Stability/Clinical Decision Making Evolving/Moderate  complexity    Rehab Potential Excellent    PT Frequency 2x / week    PT Duration 6 weeks    PT Treatment/Interventions ADLs/Self Care Home Management;Functional mobility training;Therapeutic activities;Therapeutic exercise;Manual techniques;Patient/family education;Passive range of motion    PT Next Visit Plan D/C; MRI 04/26/2021    Consulted and Agree with Plan of Care Patient  Patient will benefit from skilled therapeutic intervention in order to improve the following deficits and impairments:  Difficulty walking,Decreased activity tolerance,Pain,Decreased range of motion  Visit Diagnosis: Acute midline low back pain without sciatica     Problem List Patient Active Problem List   Diagnosis Date Noted  . Eosinophilic esophagitis   . Hematuria 12/25/2019  . Depression   . Dyspepsia 07/15/2019  . Dysphagia 07/15/2019    Marvell Fuller, PTA 04/19/2021, 4:51 PM  [facility] [address] [place], [place], [address] Phone: [phone]   Fax:  [phone]  Name: Ernest Cochran MRN: [id number] Date of Birth: May 11, 1991

## 2021-04-20 ENCOUNTER — Other Ambulatory Visit: Payer: Self-pay

## 2021-04-20 ENCOUNTER — Ambulatory Visit: Payer: Medicaid Other | Admitting: *Deleted

## 2021-04-20 DIAGNOSIS — M545 Low back pain, unspecified: Secondary | ICD-10-CM

## 2021-04-20 NOTE — Therapy (Signed)
[facility] [place], [place], [address] Phone: [phone]   Fax:  [phone]  Physical Therapy Treatment  Patient Details  Name: Ernest Cochran MRN: [id number] Date of Birth: 04-16-1991 Referring Provider (PT): Hendricks Limes.   Encounter Date: 04/20/2021   PT End of Session - 04/20/21 1658    Visit Number 12    Number of Visits 12    Date for PT Re-Evaluation 04/07/21    PT Start Time 0300    PT Stop Time 1726    PT Time Calculation (min) 41 min           Past Medical History:  Diagnosis Date  . Asthma   . Depression   . Eosinophilic esophagitis 9233  . GERD (gastroesophageal reflux disease)   . Kidney stone     Past Surgical History:  Procedure Laterality Date  . BIOPSY  01/07/2020   Procedure: BIOPSY;  Surgeon: Danie Binder, MD;  [facility];  Service: Endoscopy;;  duodenum esophagus  . ESOPHAGOGASTRODUODENOSCOPY (EGD) WITH PROPOFOL N/A 10/03/2019   Procedure: ESOPHAGOGASTRODUODENOSCOPY (EGD) WITH PROPOFOL;  Surgeon: Danie Binder, MD; LA grade B esophagitis s/p biopsy and dilation, mild gastritis s/p biopsy, and mild duodenitis.  Stomach biopsy with mild chronic inflammation negative for H. pylori.  Esophageal biopsy with inflamed squamous mucosa with increased eosinophils consistent with eosinophilic esophagitis.   Marland Kitchen ESOPHAGOGASTRODUODENOSCOPY (EGD) WITH PROPOFOL N/A 01/07/2020   Procedure: ESOPHAGOGASTRODUODENOSCOPY (EGD) WITH PROPOFOL;  Surgeon: Danie Binder, MD;  Esophageal mucosal changes secondary to eosinophilic esophagitis s/p biopsy, normal stomach, normal examined duodenum s/p biopsied.  Esophageal biopsies with increased intraepithelial eosinophils.  Duodenal bulb biopsy with peptic duodenitis, second part of duodenum with benign small bowel mucosa.   Marland Kitchen SAVORY DILATION N/A 10/03/2019   Procedure: SAVORY DILATION;  Surgeon: Danie Binder, MD;  [facility];  Service:  Endoscopy;  Laterality: N/A;  15/16/17    There were no vitals filed for this visit.   Subjective Assessment - 04/20/21 1644    Subjective COVID-19 screen performed prior to patient entering clinic.Marland Kitchen Pain into RT foot 6/10, LB 2/10    Pertinent History H/o LBP.    How long can you stand comfortably? 15 minutes.    How long can you walk comfortably? 15 minutes.    Diagnostic tests X-ray.    Patient Stated Goals Return to work.    Currently in Pain? Yes    Pain Score 6     Pain Location Back    Pain Orientation Right;Lower    Pain Descriptors / Indicators Discomfort    Pain Type Acute pain    Pain Onset More than a month ago                             [facility] Adult PT Treatment/Exercise - 04/20/21 0001      Exercises   Exercises Lumbar      Lumbar Exercises: Stretches   Other Lumbar Stretch Exercise Prone lying over 3 pillows x5 mins,2 pillows x 5 mins,1 pillow x 5 mins, prone x 5 mins, Prone on elbows x 5 mins  bolster at ankles                     ADL body mechanics and HEP reviewed             PT Long Term Goals - 04/20/21 0076  PT LONG TERM GOAL #1   Title Independent with a HEP.    Baseline doing initial HEP and will need progression next week 04/01/21    Time 6    Period Weeks    Status Achieved      PT LONG TERM GOAL #2   Title Stand 30 minutes with pain not > 2/10.    Baseline increased pain with prolong standing 04/01/21    Time 6    Period Weeks    Status Partially Met      PT LONG TERM GOAL #3   Title Perform ADL's with pain not > 2/10.    Baseline limited with ADL's    Time 6    Period Weeks    Status Partially Met                 Plan - 04/20/21 1659    Clinical Impression Statement Pt arrived today doing fair, but still with 6/10 pain into RT LE. Rx focused on prone progression starting with 3 pillows. He ended prone on elbows and no LE symptoms. HEP and body mechanics discussed. DC Pt to HEP. LTGs partially  Met due to intermittent pain levels. Pt has progressed and done well , but continues to have intermittent flare-ups.    Personal Factors and Comorbidities Comorbidity 1;Other    Comorbidities H/o LBP.    Examination-Activity Limitations Other;Transfers;Locomotion Level    Stability/Clinical Decision Making Evolving/Moderate complexity    Rehab Potential Excellent    PT Frequency 2x / week    PT Duration 6 weeks    PT Treatment/Interventions ADLs/Self Care Home Management;Functional mobility training;Therapeutic activities;Therapeutic exercise;Manual techniques;Patient/family education;Passive range of motion    PT Next Visit Plan D/C; MRI 04/26/2021           Patient will benefit from skilled therapeutic intervention in order to improve the following deficits and impairments:  Difficulty walking,Decreased activity tolerance,Pain,Decreased range of motion  Visit Diagnosis: Acute midline low back pain without sciatica     Problem List Patient Active Problem List   Diagnosis Date Noted  . Eosinophilic esophagitis   . Hematuria 12/25/2019  . Depression   . Dyspepsia 07/15/2019  . Dysphagia 07/15/2019    Dewey Neukam,CHRIS, PTA 04/20/2021, 5:53 PM  [facility] [address] [place], [place], [address] Phone: [phone]   Fax:  [phone]  Name: AYEDEN GLADMAN MRN: [id number] Date of Birth: Jul 12, 1991

## 2021-04-25 ENCOUNTER — Other Ambulatory Visit: Payer: Self-pay | Admitting: Family Medicine

## 2021-04-25 DIAGNOSIS — F419 Anxiety disorder, unspecified: Secondary | ICD-10-CM

## 2021-04-26 ENCOUNTER — Ambulatory Visit (HOSPITAL_COMMUNITY)
Admission: RE | Admit: 2021-04-26 | Discharge: 2021-04-26 | Disposition: A | Discharge status: Home / Self Care | Payer: Medicaid Other | Source: Ambulatory Visit | Attending: Family Medicine | Admitting: Family Medicine

## 2021-04-26 ENCOUNTER — Other Ambulatory Visit: Payer: Self-pay

## 2021-04-26 DIAGNOSIS — M5441 Lumbago with sciatica, right side: Secondary | ICD-10-CM | POA: Diagnosis not present

## 2021-04-26 DIAGNOSIS — M545 Low back pain, unspecified: Secondary | ICD-10-CM | POA: Diagnosis not present

## 2021-04-29 ENCOUNTER — Other Ambulatory Visit: Payer: Self-pay | Admitting: Neurological Surgery

## 2021-04-29 DIAGNOSIS — M5126 Other intervertebral disc displacement, lumbar region: Secondary | ICD-10-CM | POA: Diagnosis not present

## 2021-04-29 DIAGNOSIS — M5417 Radiculopathy, lumbosacral region: Secondary | ICD-10-CM | POA: Diagnosis not present

## 2021-05-14 NOTE — Pre-Procedure Instructions (Signed)
Surgical Instructions    Your procedure is scheduled on Wednesday, June 15th.  Report to [facility] Main Entrance "A" at 11:40 A.M., then check in with the Admitting office.  Call this number if you have problems the morning of surgery:  [phone]   If you have any questions prior to your surgery date call [phone]: Open Monday-Friday 8am-4pm    Remember:  Do not eat or drink after midnight the night before your surgery   Take these medicines the morning of surgery with A SIP OF WATER  hydrOXYzine (ATARAX/VISTARIL)  venlafaxine XR (EFFEXOR XR)  As of today, STOP taking any Aspirin (unless otherwise instructed by your surgeon) Aleve, Naproxen, Ibuprofen, Motrin, Advil, Goody's, BC's, all herbal medications, fish oil, and all vitamins.                     Do NOT Smoke (Tobacco/Vaping) or drink Alcohol 24 hours prior to your procedure.  If you use a CPAP at night, you may bring all equipment for your overnight stay.   Contacts, glasses, piercing's, hearing aid's, dentures or partials may not be worn into surgery, please bring cases for these belongings.    For patients admitted to the hospital, discharge time will be determined by your treatment team.   Patients discharged the day of surgery will not be allowed to drive home, and someone needs to stay with them for 24 hours.    Special instructions:   [facility]- Preparing For Surgery  Before surgery, you can play an important role. Because skin is not sterile, your skin needs to be as free of germs as possible. You can reduce the number of germs on your skin by washing with CHG (chlorahexidine gluconate) Soap before surgery.  CHG is an antiseptic cleaner which kills germs and bonds with the skin to continue killing germs even after washing.    Oral Hygiene is also important to reduce your risk of infection.  Remember - BRUSH YOUR TEETH THE MORNING OF SURGERY WITH YOUR REGULAR TOOTHPASTE  Please do not use if you have an  allergy to CHG or antibacterial soaps. If your skin becomes reddened/irritated stop using the CHG.  Do not shave (including legs and underarms) for at least 48 hours prior to first CHG shower. It is OK to shave your face.  Please follow these instructions carefully.   Shower the NIGHT BEFORE SURGERY and the MORNING OF SURGERY  If you chose to wash your hair, wash your hair first as usual with your normal shampoo.  After you shampoo, rinse your hair and body thoroughly to remove the shampoo.  Use CHG Soap as you would any other liquid soap. You can apply CHG directly to the skin and wash gently with a scrungie or a clean washcloth.   Apply the CHG Soap to your body ONLY FROM THE NECK DOWN.  Do not use on open wounds or open sores. Avoid contact with your eyes, ears, mouth and genitals (private parts). Wash Face and genitals (private parts)  with your normal soap.   Wash thoroughly, paying special attention to the area where your surgery will be performed.  Thoroughly rinse your body with warm water from the neck down.  DO NOT shower/wash with your normal soap after using and rinsing off the CHG Soap.  Pat yourself dry with a CLEAN TOWEL.  Wear CLEAN PAJAMAS to bed the night before surgery  Place CLEAN SHEETS on your bed the night before your surgery  DO NOT SLEEP WITH PETS.   Day of Surgery: Shower with CHG soap. Do not wear jewelry. Do not wear lotions, powders, colognes, or deodorant. Men may shave face and neck. Do not bring valuables to the hospital. [facility] is not responsible for any belongings or valuables. Wear Clean/Comfortable clothing the morning of surgery Remember to brush your teeth WITH YOUR REGULAR TOOTHPASTE.   Please read over the following fact sheets that you were given.

## 2021-05-17 ENCOUNTER — Other Ambulatory Visit: Payer: Self-pay

## 2021-05-17 ENCOUNTER — Encounter (HOSPITAL_COMMUNITY)
Admission: RE | Admit: 2021-05-17 | Discharge: 2021-05-17 | Disposition: A | Discharge status: Home / Self Care | Payer: Medicaid Other | Source: Ambulatory Visit | Attending: Neurological Surgery | Admitting: Neurological Surgery

## 2021-05-17 ENCOUNTER — Encounter (HOSPITAL_COMMUNITY): Payer: Self-pay | Admitting: Neurological Surgery

## 2021-05-17 ENCOUNTER — Ambulatory Visit (HOSPITAL_COMMUNITY)
Admission: RE | Admit: 2021-05-17 | Discharge: 2021-05-17 | Disposition: A | Discharge status: Home / Self Care | Payer: Medicaid Other | Source: Ambulatory Visit | Attending: Neurological Surgery | Admitting: Neurological Surgery

## 2021-05-17 DIAGNOSIS — M5126 Other intervertebral disc displacement, lumbar region: Secondary | ICD-10-CM | POA: Diagnosis not present

## 2021-05-17 DIAGNOSIS — Z01818 Encounter for other preprocedural examination: Secondary | ICD-10-CM | POA: Diagnosis not present

## 2021-05-17 DIAGNOSIS — Z20822 Contact with and (suspected) exposure to covid-19: Secondary | ICD-10-CM | POA: Diagnosis not present

## 2021-05-17 DIAGNOSIS — Z01812 Encounter for preprocedural laboratory examination: Secondary | ICD-10-CM | POA: Insufficient documentation

## 2021-05-17 LAB — BASIC METABOLIC PANEL
Anion gap: 6 (ref 5–15)
BUN: 11 mg/dL (ref 6–20)
CO2: 25 mmol/L (ref 22–32)
Calcium: 9.1 mg/dL (ref 8.9–10.3)
Chloride: 108 mmol/L (ref 98–111)
Creatinine, Ser: 0.94 mg/dL (ref 0.61–1.24)
GFR, Estimated: >60 mL/min (ref >60)
Glucose, Bld: 128 mg/dL — ABNORMAL HIGH (ref 70–99)
Potassium: 3.6 mmol/L (ref 3.5–5.1)
Sodium: 139 mmol/L (ref 135–145)

## 2021-05-17 LAB — CBC WITH DIFFERENTIAL/PLATELET
Abs Immature Granulocytes: 0.24 10*3/uL — ABNORMAL HIGH (ref 0.00–0.07)
Basophils Absolute: 0.2 10*3/uL — ABNORMAL HIGH (ref 0.0–0.1)
Basophils Relative: 2 %
Eosinophils Absolute: 0.8 10*3/uL — ABNORMAL HIGH (ref 0.0–0.5)
Eosinophils Relative: 8 %
HCT: 46 % (ref 39.0–52.0)
Hemoglobin: 15.6 g/dL (ref 13.0–17.0)
Immature Granulocytes: 2 %
Lymphocytes Relative: 39 %
Lymphs Abs: 3.9 10*3/uL (ref 0.7–4.0)
MCH: 29.8 pg (ref 26.0–34.0)
MCHC: 33.9 g/dL (ref 30.0–36.0)
MCV: 87.8 fL (ref 80.0–100.0)
Monocytes Absolute: 0.7 10*3/uL (ref 0.1–1.0)
Monocytes Relative: 7 %
Neutro Abs: 4.3 10*3/uL (ref 1.7–7.7)
Neutrophils Relative %: 42 %
Platelets: 304 10*3/uL (ref 150–400)
RBC: 5.24 MIL/uL (ref 4.22–5.81)
RDW: 11.8 % (ref 11.5–15.5)
WBC: 10.1 10*3/uL (ref 4.0–10.5)
nRBC: 0 % (ref 0.0–0.2)

## 2021-05-17 LAB — SURGICAL PCR SCREEN
MRSA, PCR: NEGATIVE
Staphylococcus aureus: POSITIVE — AB

## 2021-05-17 LAB — PROTIME-INR
INR: 0.9 (ref 0.8–1.2)
Prothrombin Time: 12.5 seconds (ref 11.4–15.2)

## 2021-05-17 LAB — SARS CORONAVIRUS 2 (TAT 6-24 HRS): SARS Coronavirus 2: NEGATIVE

## 2021-05-17 NOTE — Progress Notes (Signed)
PCP - Shon Hale, FNP Cardiologist - denies  Chest x-ray - 05/17/21 EKG - 05/17/21 Stress Test - 2015; normal ECHO - 2015; normal Cardiac Cath - denies  Sleep Study - denies CPAP - denies  Blood Thinner Instructions: n/a Aspirin Instructions: n/a  COVID TEST- 05/17/21  Anesthesia review: No  Patient denies shortness of breath, fever, cough and chest pain at PAT appointment   All instructions explained to the patient, with a verbal understanding of the material. Patient agrees to go over the instructions while at home for a better understanding. Patient also instructed to self quarantine after being tested for COVID-19. The opportunity to ask questions was provided.

## 2021-05-19 ENCOUNTER — Encounter (HOSPITAL_COMMUNITY): Payer: Self-pay | Admitting: Neurological Surgery

## 2021-05-19 ENCOUNTER — Other Ambulatory Visit: Payer: Self-pay

## 2021-05-19 ENCOUNTER — Ambulatory Visit (HOSPITAL_COMMUNITY): Payer: Medicaid Other

## 2021-05-19 ENCOUNTER — Ambulatory Visit (HOSPITAL_COMMUNITY)
Admission: RE | Admit: 2021-05-19 | Discharge: 2021-05-19 | Disposition: A | Discharge status: Home / Self Care | Payer: Medicaid Other | Attending: Neurological Surgery | Admitting: Neurological Surgery

## 2021-05-19 ENCOUNTER — Encounter (HOSPITAL_COMMUNITY)
Admission: RE | Disposition: A | Discharge status: Home / Self Care | Payer: Self-pay | Source: Home / Self Care | Attending: Neurological Surgery

## 2021-05-19 ENCOUNTER — Encounter: Payer: Medicaid Other | Admitting: Family Medicine

## 2021-05-19 ENCOUNTER — Ambulatory Visit (HOSPITAL_COMMUNITY): Payer: Medicaid Other | Admitting: Certified Registered Nurse Anesthetist

## 2021-05-19 DIAGNOSIS — Z981 Arthrodesis status: Secondary | ICD-10-CM | POA: Diagnosis not present

## 2021-05-19 DIAGNOSIS — Z20822 Contact with and (suspected) exposure to covid-19: Secondary | ICD-10-CM | POA: Insufficient documentation

## 2021-05-19 DIAGNOSIS — M48061 Spinal stenosis, lumbar region without neurogenic claudication: Secondary | ICD-10-CM | POA: Diagnosis not present

## 2021-05-19 DIAGNOSIS — Z419 Encounter for procedure for purposes other than remedying health state, unspecified: Secondary | ICD-10-CM

## 2021-05-19 DIAGNOSIS — F32A Depression, unspecified: Secondary | ICD-10-CM | POA: Diagnosis not present

## 2021-05-19 DIAGNOSIS — Z7951 Long term (current) use of inhaled steroids: Secondary | ICD-10-CM | POA: Diagnosis not present

## 2021-05-19 DIAGNOSIS — K219 Gastro-esophageal reflux disease without esophagitis: Secondary | ICD-10-CM | POA: Diagnosis not present

## 2021-05-19 DIAGNOSIS — M5126 Other intervertebral disc displacement, lumbar region: Secondary | ICD-10-CM

## 2021-05-19 DIAGNOSIS — Z79899 Other long term (current) drug therapy: Secondary | ICD-10-CM | POA: Diagnosis not present

## 2021-05-19 DIAGNOSIS — M5117 Intervertebral disc disorders with radiculopathy, lumbosacral region: Secondary | ICD-10-CM | POA: Insufficient documentation

## 2021-05-19 DIAGNOSIS — M4807 Spinal stenosis, lumbosacral region: Secondary | ICD-10-CM | POA: Diagnosis not present

## 2021-05-19 DIAGNOSIS — M5116 Intervertebral disc disorders with radiculopathy, lumbar region: Secondary | ICD-10-CM | POA: Diagnosis not present

## 2021-05-19 HISTORY — DX: Personal history of urinary calculi: Z87.442

## 2021-05-19 HISTORY — PX: LUMBAR LAMINECTOMY/DECOMPRESSION MICRODISCECTOMY: SHX5026

## 2021-05-19 SURGERY — LUMBAR LAMINECTOMY/DECOMPRESSION MICRODISCECTOMY 1 LEVEL
Anesthesia: General | Site: Back | Laterality: Right

## 2021-05-19 MED ORDER — LACTATED RINGERS IV SOLN: INTRAVENOUS | Status: DC

## 2021-05-19 MED ORDER — HYDROCODONE-ACETAMINOPHEN 5-325 MG PO TABS: 1.0000 | ORAL_TABLET | ORAL | 0 refills | Status: DC | PRN

## 2021-05-19 MED ORDER — CHLORHEXIDINE GLUCONATE CLOTH 2 % EX PADS: 6.0000 | MEDICATED_PAD | Freq: Once | CUTANEOUS | Status: DC

## 2021-05-19 MED ORDER — ACETAMINOPHEN 500 MG PO TABS
ORAL_TABLET | ORAL | Status: AC
  Administered 2021-05-19: 1000 mg via ORAL
  Filled 2021-05-19: qty 2

## 2021-05-19 MED ORDER — MIDAZOLAM HCL 2 MG/2ML IJ SOLN
INTRAMUSCULAR | Status: AC
  Filled 2021-05-19: qty 2

## 2021-05-19 MED ORDER — CHLORHEXIDINE GLUCONATE 0.12 % MT SOLN: 15.0000 mL | Freq: Once | OROMUCOSAL | Status: AC

## 2021-05-19 MED ORDER — 0.9 % SODIUM CHLORIDE (POUR BTL) OPTIME
TOPICAL | Status: DC | PRN
  Administered 2021-05-19: 1000 mL

## 2021-05-19 MED ORDER — PROPOFOL 10 MG/ML IV BOLUS
INTRAVENOUS | Status: AC
  Filled 2021-05-19: qty 20

## 2021-05-19 MED ORDER — OXYCODONE HCL 5 MG PO TABS
5.0000 mg | ORAL_TABLET | Freq: Once | ORAL | Status: AC | PRN
  Administered 2021-05-19: 5 mg via ORAL

## 2021-05-19 MED ORDER — PROMETHAZINE HCL 25 MG/ML IJ SOLN: 6.2500 mg | INTRAMUSCULAR | Status: DC | PRN

## 2021-05-19 MED ORDER — DEXMEDETOMIDINE (PRECEDEX) IN NS 20 MCG/5ML (4 MCG/ML) IV SYRINGE
PREFILLED_SYRINGE | INTRAVENOUS | Status: AC
  Filled 2021-05-19: qty 5

## 2021-05-19 MED ORDER — THROMBIN 5000 UNITS EX SOLR
CUTANEOUS | Status: AC
  Filled 2021-05-19: qty 5000

## 2021-05-19 MED ORDER — GABAPENTIN 300 MG PO CAPS
300.0000 mg | ORAL_CAPSULE | ORAL | Status: AC
  Administered 2021-05-19: 300 mg via ORAL
  Filled 2021-05-19: qty 1

## 2021-05-19 MED ORDER — BUPIVACAINE HCL (PF) 0.25 % IJ SOLN
INTRAMUSCULAR | Status: AC
  Filled 2021-05-19: qty 30

## 2021-05-19 MED ORDER — CHLORHEXIDINE GLUCONATE 0.12 % MT SOLN
OROMUCOSAL | Status: AC
  Administered 2021-05-19: 15 mL via OROMUCOSAL
  Filled 2021-05-19: qty 15

## 2021-05-19 MED ORDER — SUGAMMADEX SODIUM 500 MG/5ML IV SOLN
INTRAVENOUS | Status: AC
  Filled 2021-05-19: qty 5

## 2021-05-19 MED ORDER — OXYCODONE HCL 5 MG/5ML PO SOLN: 5.0000 mg | Freq: Once | ORAL | Status: AC | PRN

## 2021-05-19 MED ORDER — BUPIVACAINE HCL (PF) 0.25 % IJ SOLN
INTRAMUSCULAR | Status: DC | PRN
  Administered 2021-05-19: 10 mL
  Administered 2021-05-19: 7 mL

## 2021-05-19 MED ORDER — PHENYLEPHRINE 40 MCG/ML (10ML) SYRINGE FOR IV PUSH (FOR BLOOD PRESSURE SUPPORT)
PREFILLED_SYRINGE | INTRAVENOUS | Status: DC | PRN
  Administered 2021-05-19: 120 ug via INTRAVENOUS

## 2021-05-19 MED ORDER — DEXAMETHASONE SODIUM PHOSPHATE 10 MG/ML IJ SOLN
10.0000 mg | Freq: Once | INTRAMUSCULAR | Status: AC
  Administered 2021-05-19: 10 mg via INTRAVENOUS

## 2021-05-19 MED ORDER — CEFAZOLIN SODIUM-DEXTROSE 2-4 GM/100ML-% IV SOLN
2.0000 g | INTRAVENOUS | Status: AC
  Administered 2021-05-19: 2 g via INTRAVENOUS

## 2021-05-19 MED ORDER — MIDAZOLAM HCL 5 MG/5ML IJ SOLN
INTRAMUSCULAR | Status: DC | PRN
  Administered 2021-05-19: 2 mg via INTRAVENOUS

## 2021-05-19 MED ORDER — ORAL CARE MOUTH RINSE: 15.0000 mL | Freq: Once | OROMUCOSAL | Status: AC

## 2021-05-19 MED ORDER — OXYCODONE HCL 5 MG PO TABS
ORAL_TABLET | ORAL | Status: AC
  Filled 2021-05-19: qty 1

## 2021-05-19 MED ORDER — SUGAMMADEX SODIUM 200 MG/2ML IV SOLN
INTRAVENOUS | Status: DC | PRN
  Administered 2021-05-19: 450 mg via INTRAVENOUS

## 2021-05-19 MED ORDER — ACETAMINOPHEN 500 MG PO TABS: 1000.0000 mg | ORAL_TABLET | ORAL | Status: AC

## 2021-05-19 MED ORDER — FENTANYL CITRATE (PF) 100 MCG/2ML IJ SOLN
INTRAMUSCULAR | Status: AC
  Filled 2021-05-19: qty 2

## 2021-05-19 MED ORDER — PROPOFOL 10 MG/ML IV BOLUS
INTRAVENOUS | Status: DC | PRN
  Administered 2021-05-19: 20 mg via INTRAVENOUS
  Administered 2021-05-19: 30 mg via INTRAVENOUS
  Administered 2021-05-19: 200 mg via INTRAVENOUS

## 2021-05-19 MED ORDER — THROMBIN 5000 UNITS EX SOLR
OROMUCOSAL | Status: DC | PRN
  Administered 2021-05-19: 5 mL via TOPICAL

## 2021-05-19 MED ORDER — FENTANYL CITRATE (PF) 250 MCG/5ML IJ SOLN
INTRAMUSCULAR | Status: AC
  Filled 2021-05-19: qty 5

## 2021-05-19 MED ORDER — KETOROLAC TROMETHAMINE 30 MG/ML IJ SOLN
INTRAMUSCULAR | Status: DC | PRN
  Administered 2021-05-19: 30 mg via INTRAVENOUS

## 2021-05-19 MED ORDER — EPHEDRINE SULFATE-NACL 50-0.9 MG/10ML-% IV SOSY: PREFILLED_SYRINGE | INTRAVENOUS | Status: DC | PRN

## 2021-05-19 MED ORDER — ONDANSETRON HCL 4 MG/2ML IJ SOLN
INTRAMUSCULAR | Status: DC | PRN
  Administered 2021-05-19: 4 mg via INTRAVENOUS

## 2021-05-19 MED ORDER — FENTANYL CITRATE (PF) 250 MCG/5ML IJ SOLN
INTRAMUSCULAR | Status: DC | PRN
  Administered 2021-05-19 (×2): 50 ug via INTRAVENOUS
  Administered 2021-05-19: 100 ug via INTRAVENOUS

## 2021-05-19 MED ORDER — ROCURONIUM BROMIDE 10 MG/ML (PF) SYRINGE
PREFILLED_SYRINGE | INTRAVENOUS | Status: DC | PRN
  Administered 2021-05-19: 30 mg via INTRAVENOUS
  Administered 2021-05-19: 60 mg via INTRAVENOUS

## 2021-05-19 MED ORDER — FENTANYL CITRATE (PF) 100 MCG/2ML IJ SOLN
25.0000 ug | INTRAMUSCULAR | Status: DC | PRN
  Administered 2021-05-19: 50 ug via INTRAVENOUS

## 2021-05-19 MED ORDER — LIDOCAINE HCL (PF) 2 % IJ SOLN
INTRAMUSCULAR | Status: DC | PRN
  Administered 2021-05-19: 40 mg via INTRADERMAL
  Administered 2021-05-19: 60 mg via INTRADERMAL

## 2021-05-19 MED ORDER — CEFAZOLIN SODIUM-DEXTROSE 2-4 GM/100ML-% IV SOLN
INTRAVENOUS | Status: AC
  Filled 2021-05-19: qty 100

## 2021-05-19 SURGICAL SUPPLY — 45 items
BAND RUBBER #18 3X1/16 STRL (MISCELLANEOUS) ×6 IMPLANT
BENZOIN TINCTURE PRP APPL 2/3 (GAUZE/BANDAGES/DRESSINGS) ×3 IMPLANT
BUR CARBIDE MATCH 3.0 (BURR) ×3 IMPLANT
CANISTER SUCT 3000ML PPV (MISCELLANEOUS) ×3 IMPLANT
CLOSURE WOUND 1/2 X4 (GAUZE/BANDAGES/DRESSINGS) ×1
COVER WAND RF STERILE (DRAPES) ×3 IMPLANT
DERMABOND ADVANCED (GAUZE/BANDAGES/DRESSINGS) ×2
DERMABOND ADVANCED .7 DNX12 (GAUZE/BANDAGES/DRESSINGS) ×1 IMPLANT
DRAPE LAPAROTOMY 100X72X124 (DRAPES) ×3 IMPLANT
DRAPE MICROSCOPE LEICA (MISCELLANEOUS) ×3 IMPLANT
DRAPE SURG 17X23 STRL (DRAPES) ×3 IMPLANT
DRSG OPSITE POSTOP 3X4 (GAUZE/BANDAGES/DRESSINGS) ×3 IMPLANT
DURAPREP 26ML APPLICATOR (WOUND CARE) ×3 IMPLANT
ELECT BLADE 4.0 EZ CLEAN MEGAD (MISCELLANEOUS) ×3
ELECT REM PT RETURN 9FT ADLT (ELECTROSURGICAL) ×3
ELECTRODE BLDE 4.0 EZ CLN MEGD (MISCELLANEOUS) ×1 IMPLANT
ELECTRODE REM PT RTRN 9FT ADLT (ELECTROSURGICAL) ×1 IMPLANT
GAUZE 4X4 16PLY RFD (DISPOSABLE) IMPLANT
GLOVE BIO SURGEON STRL SZ7 (GLOVE) IMPLANT
GLOVE BIO SURGEON STRL SZ8 (GLOVE) ×3 IMPLANT
GLOVE SURG LTX SZ7.5 (GLOVE) ×6 IMPLANT
GLOVE SURG UNDER POLY LF SZ7 (GLOVE) ×3 IMPLANT
GOWN STRL REUS W/ TWL LRG LVL3 (GOWN DISPOSABLE) IMPLANT
GOWN STRL REUS W/ TWL XL LVL3 (GOWN DISPOSABLE) ×2 IMPLANT
GOWN STRL REUS W/TWL 2XL LVL3 (GOWN DISPOSABLE) IMPLANT
GOWN STRL REUS W/TWL LRG LVL3 (GOWN DISPOSABLE)
GOWN STRL REUS W/TWL XL LVL3 (GOWN DISPOSABLE) ×6
HEMOSTAT POWDER KIT SURGIFOAM (HEMOSTASIS) ×3 IMPLANT
KIT BASIN OR (CUSTOM PROCEDURE TRAY) ×3 IMPLANT
KIT POSITION SURG JACKSON T1 (MISCELLANEOUS) ×3 IMPLANT
KIT TURNOVER KIT B (KITS) ×3 IMPLANT
NEEDLE HYPO 25X1 1.5 SAFETY (NEEDLE) ×3 IMPLANT
NEEDLE SPNL 20GX3.5 QUINCKE YW (NEEDLE) IMPLANT
NS IRRIG 1000ML POUR BTL (IV SOLUTION) ×3 IMPLANT
PACK LAMINECTOMY NEURO (CUSTOM PROCEDURE TRAY) ×3 IMPLANT
PAD ARMBOARD 7.5X6 YLW CONV (MISCELLANEOUS) ×9 IMPLANT
SPONGE SURGIFOAM ABS GEL SZ50 (HEMOSTASIS) IMPLANT
STRIP CLOSURE SKIN 1/2X4 (GAUZE/BANDAGES/DRESSINGS) ×2 IMPLANT
SUT VIC AB 0 CT1 18XCR BRD8 (SUTURE) ×1 IMPLANT
SUT VIC AB 0 CT1 8-18 (SUTURE) ×3
SUT VIC AB 2-0 CP2 18 (SUTURE) ×3 IMPLANT
SUT VIC AB 3-0 SH 8-18 (SUTURE) ×3 IMPLANT
TOWEL GREEN STERILE (TOWEL DISPOSABLE) ×3 IMPLANT
TOWEL GREEN STERILE FF (TOWEL DISPOSABLE) ×3 IMPLANT
WATER STERILE IRR 1000ML POUR (IV SOLUTION) ×3 IMPLANT

## 2021-05-19 NOTE — H&P (Signed)
Subjective: Patient is a 30 y.o. male admitted for leg pain. Onset of symptoms was several months ago, gradually worsening since that time.  The pain is rated severe, and is located at the across the lower back and radiates to leg. The pain is described as aching and occurs all day. The symptoms have been progressive. Symptoms are exacerbated by exercise and standing. MRI or CT showed HNP L5-S1   Past Medical History:  Diagnosis Date   Asthma    Depression    Eosinophilic esophagitis 2020   GERD (gastroesophageal reflux disease)    History of kidney stones     Past Surgical History:  Procedure Laterality Date   BIOPSY  01/07/2020   Procedure: BIOPSY;  Surgeon: West Bali, MD;  [facility];  Service: Endoscopy;;  duodenum esophagus   ESOPHAGOGASTRODUODENOSCOPY (EGD) WITH PROPOFOL N/A 10/03/2019   Procedure: ESOPHAGOGASTRODUODENOSCOPY (EGD) WITH PROPOFOL;  Surgeon: West Bali, MD; LA grade B esophagitis s/p biopsy and dilation, mild gastritis s/p biopsy, and mild duodenitis.  Stomach biopsy with mild chronic inflammation negative for H. pylori.  Esophageal biopsy with inflamed squamous mucosa with increased eosinophils consistent with eosinophilic esophagitis.    ESOPHAGOGASTRODUODENOSCOPY (EGD) WITH PROPOFOL N/A 01/07/2020   Procedure: ESOPHAGOGASTRODUODENOSCOPY (EGD) WITH PROPOFOL;  Surgeon: West Bali, MD;  Esophageal mucosal changes secondary to eosinophilic esophagitis s/p biopsy, normal stomach, normal examined duodenum s/p biopsied.  Esophageal biopsies with increased intraepithelial eosinophils.  Duodenal bulb biopsy with peptic duodenitis, second part of duodenum with benign small bowel mucosa.    SAVORY DILATION N/A 10/03/2019   Procedure: SAVORY DILATION;  Surgeon: West Bali, MD;  [facility];  Service: Endoscopy;  Laterality: N/A;  15/16/17    Prior to Admission medications   Medication Sig Start Date End Date Taking? Authorizing Provider   hydrOXYzine (ATARAX/VISTARIL) 25 MG tablet TAKE 1 TABLET(25 MG) BY MOUTH THREE TIMES DAILY AS NEEDED FOR ANXIETY Patient taking differently: Take 25 mg by mouth 3 (three) times daily as needed for anxiety. 04/26/21  Yes [name] F, FNP  venlafaxine XR (EFFEXOR XR) 75 MG 24 hr capsule Take 1 capsule (75 mg total) by mouth daily. 04/06/21  Yes [name] F, FNP  fluticasone (FLOVENT HFA) 220 MCG/ACT inhaler 2 PUFFs INTO BACK OF THROAT AND THEN SWALLOW TWICE A DAY FOR 8 WEEKS. Patient not taking: Reported on 05/12/2021 01/14/20   West Bali, MD  ibuprofen (ADVIL) 600 MG tablet Take 1 tablet (600 mg total) by mouth every 6 (six) hours as needed. Patient not taking: Reported on 05/12/2021 02/12/21   Jacalyn Lefevre, MD   No Known Allergies  Social History   Tobacco Use   Smoking status: Former    Pack years: 0.00    Types: Cigarettes   Smokeless tobacco: Never  Substance Use Topics   Alcohol use: Not Currently    Comment: occasional. 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     Family History  Problem Relation Age of Onset   Colon cancer Maternal Grandfather    Diabetes Maternal Grandfather    Heart disease Maternal Grandfather    Lung cancer Maternal Grandmother    Asthma Son    Colon polyps Neg Hx      Review of Systems  Positive ROS: neg  All other systems have been reviewed and were otherwise negative with the exception of those mentioned in the HPI and as above.  Objective: Vital signs in last 24  hours: Temp:  [98.2 F (36.8 C)] 98.2 F (36.8 C) (06/15 1153) Pulse Rate:  [70] 70 (06/15 1153) Resp:  [17] 17 (06/15 1153) BP: (138)/(71) 138/71 (06/15 1153) SpO2:  [97 %] 97 % (06/15 1153) Weight:  [112.8 kg] 112.8 kg (06/15 1153)  General Appearance: Alert, cooperative, no distress, appears stated age Head: Normocephalic, without obvious abnormality, atraumatic Eyes: PERRL, conjunctiva/corneas clear, EOM's intact    Neck: Supple, symmetrical, trachea  midline Back: Symmetric, no curvature, ROM normal, no CVA tenderness Lungs:  respirations unlabored Heart: Regular rate and rhythm Abdomen: Soft, non-tender Extremities: Extremities normal, atraumatic, no cyanosis or edema Pulses: 2+ and symmetric all extremities Skin: Skin color, texture, turgor normal, no rashes or lesions  NEUROLOGIC:   Mental status: Alert and oriented x4,  no aphasia, good attention span, fund of knowledge, and memory Motor Exam - grossly normal Sensory Exam - grossly normal Reflexes: 1+ Coordination - grossly normal Gait - grossly normal Balance - grossly normal Cranial Nerves: I: smell Not tested  II: visual acuity  OS: nl    OD: nl  II: visual fields Full to confrontation  II: pupils Equal, round, reactive to light  III,VII: ptosis None  III,IV,VI: extraocular muscles  Full ROM  V: mastication Normal  V: facial light touch sensation  Normal  V,VII: corneal reflex  Present  VII: facial muscle function - upper  Normal  VII: facial muscle function - lower Normal  VIII: hearing Not tested  IX: soft palate elevation  Normal  IX,X: gag reflex Present  XI: trapezius strength  5/5  XI: sternocleidomastoid strength 5/5  XI: neck flexion strength  5/5  XII: tongue strength  Normal    Data Review Lab Results  Component Value Date   WBC 10.1 05/17/2021   HGB 15.6 05/17/2021   HCT 46.0 05/17/2021   MCV 87.8 05/17/2021   PLT 304 05/17/2021   Lab Results  Component Value Date   NA 139 05/17/2021   K 3.6 05/17/2021   CL 108 05/17/2021   CO2 25 05/17/2021   BUN 11 05/17/2021   CREATININE 0.94 05/17/2021   GLUCOSE 128 (H) 05/17/2021   Lab Results  Component Value Date   INR 0.9 05/17/2021    Assessment/Plan:  Estimated body mass index is 35.67 kg/m as calculated from the following:   Height as of this encounter: 5\' 10"  (1.778 m).   Weight as of this encounter: 112.8 kg. Patient admitted for microdiskectomy L5-S1 . Patient has failed a  reasonable attempt at conservative therapy.  I explained the condition and procedure to the patient and answered any questions.  Patient wishes to proceed with procedure as planned. Understands risks/ benefits and typical outcomes of procedure.   05/19/2021 3:36 PM

## 2021-05-19 NOTE — Anesthesia Preprocedure Evaluation (Addendum)
Anesthesia Evaluation  Patient identified by MRN, date of birth, ID band Patient awake    Reviewed: Allergy & Precautions, NPO status , Patient's Chart, lab work & pertinent test results  History of Anesthesia Complications Negative for: history of anesthetic complications  Airway Mallampati: II  TM Distance: >3 FB Neck ROM: Full    Dental  (+) Dental Advisory Given, Chipped,    Pulmonary asthma , Current Smoker and Patient abstained from smoking.,    Pulmonary exam normal        Cardiovascular negative cardio ROS Normal cardiovascular exam     Neuro/Psych PSYCHIATRIC DISORDERS Depression negative neurological ROS     GI/Hepatic GERD  Controlled,(+)     substance abuse  marijuana use,   Endo/Other   Obesity   Renal/GU negative Renal ROS     Musculoskeletal negative musculoskeletal ROS (+)   Abdominal   Peds  Hematology negative hematology ROS (+)   Anesthesia Other Findings Covid test negative   Reproductive/Obstetrics                            Anesthesia Physical Anesthesia Plan  ASA: 2  Anesthesia Plan: General   Post-op Pain Management:    Induction: Intravenous  PONV Risk Score and Plan: 3 and Treatment may vary due to age or medical condition, Ondansetron, Midazolam and Dexamethasone  Airway Management Planned: Oral ETT  Additional Equipment: None  Intra-op Plan:   Post-operative Plan: Extubation in OR  Informed Consent: I have reviewed the patients History and Physical, chart, labs and discussed the procedure including the risks, benefits and alternatives for the proposed anesthesia with the patient or authorized representative who has indicated his/her understanding and acceptance.     Dental advisory given  Plan Discussed with: CRNA and Anesthesiologist  Anesthesia Plan Comments:        Anesthesia Quick Evaluation

## 2021-05-19 NOTE — Op Note (Signed)
05/19/2021  5:32 PM  PATIENT:  Ernest Cochran  30 y.o. 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 Imaging showed large midline disc nation at L5-S1 with severe spinal stenosis. The patient tried conservative measures without relief. Pain was debilitating. Recommended hemilaminectomy and discectomy. Patient understood the risks, benefits, and alternatives and potential outcomes and wished to proceed.  PROCEDURE DETAILS: The patient was taken to the operating room and after induction of adequate generalized endotracheal anesthesia, the patient was rolled into the prone position on the Wilson frame and all pressure points were padded. The lumbar region was cleaned and then prepped with DuraPrep and draped in the usual sterile fashion. 5 cc of local anesthesia was injected and then a dorsal midline incision was made and carried down to the lumbo sacral fascia. The fascia was opened and the paraspinous musculature was taken down in a subperiosteal fashion to expose L5-S1 on the right. Intraoperative x-ray confirmed my level, and then I used a combination of the high-speed drill and the Kerrison punches to perform a hemilaminectomy, medial facetectomy, and foraminotomy at L5-S1 on the right. The underlying yellow ligament was opened and removed in a piecemeal fashion to expose the underlying dura and exiting nerve root. I undercut the lateral  recess and dissected down until I was medial to and distal to the pedicle. The nerve root was well decompressed. We then gently retracted the nerve root medially with a retractor, coagulated the epidural venous vasculature, and found a very large midline subannular disc herniation. we incised the disc space. We performed a thorough intradiscal discectomy with pituitary rongeurs and curettes, until I had a nice decompression of the nerve root and the midline.  We used a coronary dilator to reach up under the dura in the midline and pushed a large free fragment into the disc and removed it.  I then palpated with a coronary dilator along the nerve root and into the foramen and into the midline to assure adequate decompression. I felt no more compression of the nerve root.  The nerve root appeared free and pulsatile.  I irrigated with saline solution containing bacitracin. Achieved hemostasis with bipolar cautery, lined the dura with Gelfoam, and then closed the fascia with 0 Vicryl. I closed the subcutaneous tissues with 2-0 Vicryl and the subcuticular tissues with 3-0 Vicryl. The skin was then closed with benzoin and Steri-Strips. The drapes were removed, a sterile dressing was applied.  My nurse practitioner was involved in the exposure, safe retraction of the neural elements, the disc work and the closure. the patient was awakened from general anesthesia and transferred to the recovery room in stable condition. At the end of the procedure all sponge, needle and instrument counts were correct.    PLAN OF CARE: Discharge to home after PACU  PATIENT DISPOSITION:  PACU - hemodynamically stable.   Delay start of Pharmacological VTE agent (>24hrs) due to surgical blood loss or risk of bleeding:  yes

## 2021-05-19 NOTE — Transfer of Care (Signed)
Immediate Anesthesia Transfer of Care Note  Patient: Ernest Cochran  Procedure(s) Performed: Microdiscectomy -Lumbar Five-Sacral One Right (Right: Back)  Patient Location: PACU  Anesthesia Type:General  Level of Consciousness: awake, alert , oriented and patient cooperative  Airway & Oxygen Therapy: Patient Spontanous Breathing  Post-op Assessment: Report given to RN, Post -op Vital signs reviewed and stable and Patient moving all extremities X 4  Post vital signs: Reviewed and stable  Last Vitals:  Vitals Value Taken Time  BP 119/73 05/19/21 1753  Temp 36.4 C 05/19/21 1750  Pulse 87 05/19/21 1755  Resp 14 05/19/21 1755  SpO2 93 % 05/19/21 1755  Vitals shown include unvalidated device data.  Last Pain:  Vitals:   05/19/21 1750  TempSrc:   PainSc: 0-No pain      Patients Stated Pain Goal: 3 (05/19/21 1153)  Complications: No notable events documented.

## 2021-05-19 NOTE — Progress Notes (Signed)
Pt's wallet given to his wife, Hospital doctor.

## 2021-05-19 NOTE — Anesthesia Procedure Notes (Signed)
Procedure Name: Intubation Date/Time: 05/19/2021 4:11 PM Performed by: Waynard Edwards, CRNA Pre-anesthesia Checklist: Patient identified, Emergency Drugs available, Suction available and Patient being monitored Patient Re-evaluated:Patient Re-evaluated prior to induction Oxygen Delivery Method: Circle system utilized Preoxygenation: Pre-oxygenation with 100% oxygen Induction Type: IV induction Ventilation: Mask ventilation without difficulty and Oral airway inserted - appropriate to patient size Laryngoscope Size: Hyacinth Meeker and 2 Grade View: Grade I Tube type: Oral Tube size: 7.5 mm Number of attempts: 1 Airway Equipment and Method: Stylet and Oral airway Placement Confirmation: ETT inserted through vocal cords under direct vision, positive ETCO2 and breath sounds checked- equal and bilateral Secured at: 22 cm Tube secured with: Tape Dental Injury: Teeth and Oropharynx as per pre-operative assessment

## 2021-05-19 NOTE — Discharge Summary (Signed)
Physician Discharge Summary  Patient ID: Ernest Cochran MRN: [id number] DOB/AGE: 08/27/1991 29 y.o.  Admit date: 05/19/2021 Discharge date: 05/19/2021  Admission Diagnoses: Lumbar stenosis L5-S1 with radiculopathy   Discharge Diagnoses: Same status post microdiscectomy   Discharged Condition: good  Hospital Course: The patient was admitted on 05/19/2021 and taken to the operating room where the patient underwent right L5-S1 decompression and discectomy. The patient tolerated the procedure well and was taken to the recovery room and then to the PACU in stable condition. The hospital course was routine. There were no complications. The wound remained clean dry and intact. Pt had appropriate back soreness. No complaints of leg pain or new N/T/W. The patient remained afebrile with stable vital signs, and tolerated a regular diet. The patient continued to increase activities, and pain was well controlled with oral pain medications.   Consults: None  Significant Diagnostic Studies:  Results for orders placed or performed during the hospital encounter of 05/19/21  Surgical pcr screen   Specimen: Nasal Mucosa; Nasal Swab  Result Value Ref Range   MRSA, PCR NEGATIVE NEGATIVE   Staphylococcus aureus POSITIVE (A) NEGATIVE  SARS CORONAVIRUS 2 (TAT 6-24 HRS) Nasopharyngeal Nasopharyngeal Swab   Specimen: Nasopharyngeal Swab  Result Value Ref Range   SARS Coronavirus 2 NEGATIVE NEGATIVE  CBC WITH DIFFERENTIAL  Result Value Ref Range   WBC 10.1 4.0 - 10.5 K/uL   RBC 5.24 4.22 - 5.81 MIL/uL   Hemoglobin 15.6 13.0 - 17.0 g/dL   HCT 46.5 68.1 - 27.5 %   MCV 87.8 80.0 - 100.0 fL   MCH 29.8 26.0 - 34.0 pg   MCHC 33.9 30.0 - 36.0 g/dL   RDW 17.0 01.7 - 49.4 %   Platelets 304 150 - 400 K/uL   nRBC 0.0 0.0 - 0.2 %   Neutrophils Relative % 42 %   Neutro Abs 4.3 1.7 - 7.7 K/uL   Lymphocytes Relative 39 %   Lymphs Abs 3.9 0.7 - 4.0 K/uL   Monocytes Relative 7 %   Monocytes Absolute 0.7 0.1 - 1.0  K/uL   Eosinophils Relative 8 %   Eosinophils Absolute 0.8 (H) 0.0 - 0.5 K/uL   Basophils Relative 2 %   Basophils Absolute 0.2 (H) 0.0 - 0.1 K/uL   Immature Granulocytes 2 %   Abs Immature Granulocytes 0.24 (H) 0.00 - 0.07 K/uL  Basic metabolic panel  Result Value Ref Range   Sodium 139 135 - 145 mmol/L   Potassium 3.6 3.5 - 5.1 mmol/L   Chloride 108 98 - 111 mmol/L   CO2 25 22 - 32 mmol/L   Glucose, Bld 128 (H) 70 - 99 mg/dL   BUN 11 6 - 20 mg/dL   Creatinine, Ser 4.96 0.61 - 1.24 mg/dL   Calcium 9.1 8.9 - 75.9 mg/dL   GFR, Estimated >16 >38 mL/min   Anion gap 6 5 - 15  Protime-INR  Result Value Ref Range   Prothrombin Time 12.5 11.4 - 15.2 seconds   INR 0.9 0.8 - 1.2    Chest 2 View  Result Date: 05/17/2021 CLINICAL DATA:  Pre-op clearance exam for lumbar spine surgery. EXAM: CHEST - 2 VIEW COMPARISON:  09/23/2018 FINDINGS: The heart size and mediastinal contours are within normal limits. Both lungs are clear. The visualized skeletal structures are unremarkable. IMPRESSION: No active cardiopulmonary disease. Electronically Signed   By: Danae Orleans M.D.   On: 05/17/2021 13:38   DG Lumbar Spine 2-3 Views  Result Date: 05/19/2021  CLINICAL DATA:  Micro discectomy. EXAM: LUMBAR SPINE - 2-3 VIEW COMPARISON:  February 16, 2021 FINDINGS: Intraoperative radiographs from micro discectomy demonstrate surgical instruments at L5-S1. IMPRESSION: Intraoperative radiographs from micro discectomy at L5-S1. Electronically Signed   By: Ted Mcalpine M.D.   On: 05/19/2021 17:38   MR Lumbar Spine Wo Contrast  Result Date: 04/27/2021 CLINICAL DATA:  Initial evaluation for lower back pain with radiation into the right lower extremity for 3 months. EXAM: MRI LUMBAR SPINE WITHOUT CONTRAST TECHNIQUE: Multiplanar, multisequence MR imaging of the lumbar spine was performed. No intravenous contrast was administered. COMPARISON:  Prior radiograph from 02/16/2021 FINDINGS: Segmentation: Standard. Lowest  well-formed disc space labeled the L5-S1 level. Alignment: Straightening of the normal lumbar lordosis. No significant listhesis. Vertebrae: Vertebral body height maintained without acute or chronic fracture. Bone marrow signal intensity within normal limits. No discrete or worrisome osseous lesions. Mild discogenic reactive endplate change present about the L5-S1 interspace. No other abnormal marrow edema. Conus medullaris and cauda equina: Conus extends to the T12 level. Conus and cauda equina appear normal. Paraspinal and other soft tissues: Paraspinous soft tissues within normal limits. Visualized visceral structures are normal. Disc levels: No significant findings are seen through the L3-4 level. L4-5: Diffuse disc bulge with disc desiccation. Superimposed small central disc protrusion with annular fissure. Mild facet hypertrophy. Resultant mild canal with bilateral lateral recess stenosis. Mild left L4 foraminal narrowing. Right neural foramina remains patent. L5-S1: Diffuse disc bulge with disc desiccation. Superimposed central to right subarticular disc protrusion. Disc material impinges upon the descending right S1 nerve root as it courses through the right lateral recess. Superimposed mild facet hypertrophy. Moderate to severe canal with bilateral lateral recess stenosis. Mild bilateral L5 foraminal narrowing. IMPRESSION: 1. Central to right subarticular disc protrusion at L5-S1, impinging upon the descending right S1 nerve root as it courses through the right lateral recess. 2. Disc bulge with small central disc protrusion at L4-5 with resultant mild canal and bilateral lateral recess stenosis. Electronically Signed   By: Rise Mu M.D.   On: 04/27/2021 05:00    Antibiotics:  Anti-infectives (From admission, onward)    Start     Dose/Rate Route Frequency Ordered Stop   05/19/21 1145  ceFAZolin (ANCEF) IVPB 2g/100 mL premix        2 g 200 mL/hr over 30 Minutes Intravenous On call to O.R.  05/19/21 1134 05/19/21 1636   05/19/21 1143  ceFAZolin (ANCEF) 2-4 GM/100ML-% IVPB       Note to Pharmacy: Marcelle Smiling   : cabinet override      05/19/21 1143 05/19/21 1606       Discharge Exam: Blood pressure 138/71, pulse 70, temperature 98.2 F (36.8 C), temperature source Oral, resp. rate 17, height 5\' 10"  (1.778 m), weight 112.8 kg, SpO2 97 %. Neurologic: Grossly normal Dressing dry  Discharge Medications:   Allergies as of 05/19/2021   No Known Allergies      Medication List     TAKE these medications    fluticasone 220 MCG/ACT inhaler Commonly known as: FLOVENT HFA 2 PUFFs INTO BACK OF THROAT AND THEN SWALLOW TWICE A DAY FOR 8 WEEKS.   HYDROcodone-acetaminophen 5-325 MG tablet Commonly known as: NORCO/VICODIN Take 1 tablet by mouth every 4 (four) hours as needed for moderate pain.   ibuprofen 600 MG tablet Commonly known as: ADVIL Take 1 tablet (600 mg total) by mouth every 6 (six) hours as needed.   venlafaxine XR 75 MG 24 hr capsule  Commonly known as: Effexor XR Take 1 capsule (75 mg total) by mouth daily.       ASK your doctor about these medications    hydrOXYzine 25 MG tablet Commonly known as: ATARAX/VISTARIL TAKE 1 TABLET(25 MG) BY MOUTH THREE TIMES DAILY AS NEEDED FOR ANXIETY        Disposition: Home   Final Dx: Right L5-S1 decompression and discectomy  Discharge Instructions      Remove dressing in 72 hours   Complete by: As directed    Call MD for:  difficulty breathing, headache or visual disturbances   Complete by: As directed    Call MD for:  persistant nausea and vomiting   Complete by: As directed    Call MD for:  redness, tenderness, or signs of infection (pain, swelling, redness, odor or green/yellow discharge around incision site)   Complete by: As directed    Call MD for:  severe uncontrolled pain   Complete by: As directed    Call MD for:  temperature >100.4   Complete by: As directed    Diet - low sodium heart  healthy   Complete by: As directed    Discharge instructions   Complete by: As directed    No driving for 1 week, may shower, no lifting anything heavier than a gallon of milk, no bending lifting or twisting.   Increase activity slowly   Complete by: As directed         Follow-up Information     Tia Alert, MD. Schedule an appointment as soon as possible for a visit in 2 week(s).   Specialty: Neurosurgery Contact information: [address]  [place] [address] [phone]                  Signed: Tia Alert 05/19/2021, 5:40 PM

## 2021-05-20 ENCOUNTER — Encounter (HOSPITAL_COMMUNITY): Payer: Self-pay | Admitting: Neurological Surgery

## 2021-05-20 NOTE — Anesthesia Postprocedure Evaluation (Signed)
Anesthesia Post Note  Patient: Ernest Cochran  Procedure(s) Performed: Microdiscectomy -Lumbar Five-Sacral One Right (Right: Back)     Patient location during evaluation: PACU Anesthesia Type: General Level of consciousness: awake and alert Pain management: pain level controlled Vital Signs Assessment: post-procedure vital signs reviewed and stable Respiratory status: spontaneous breathing, nonlabored ventilation and respiratory function stable Cardiovascular status: stable and blood pressure returned to baseline Anesthetic complications: no   No notable events documented.  Last Vitals:  Vitals:   05/19/21 1805 05/19/21 1820  BP: 122/86 128/73  Pulse: 87 84  Resp: 12 18  Temp:  (!) 36.4 C  SpO2: 91% 93%    Last Pain:  Vitals:   05/19/21 1820  TempSrc:   PainSc: 3                  Beryle Lathe

## 2021-05-26 ENCOUNTER — Ambulatory Visit: Payer: Medicaid Other | Admitting: Family Medicine

## 2021-05-29 ENCOUNTER — Other Ambulatory Visit: Payer: Self-pay | Admitting: Family Medicine

## 2021-05-29 DIAGNOSIS — F419 Anxiety disorder, unspecified: Secondary | ICD-10-CM

## 2021-06-18 ENCOUNTER — Emergency Department (HOSPITAL_COMMUNITY): Payer: BLUE CROSS/BLUE SHIELD

## 2021-06-18 ENCOUNTER — Encounter: Payer: Self-pay | Admitting: Family Medicine

## 2021-06-18 ENCOUNTER — Ambulatory Visit: Payer: Medicaid Other | Admitting: Family Medicine

## 2021-06-18 ENCOUNTER — Other Ambulatory Visit: Payer: Self-pay

## 2021-06-18 ENCOUNTER — Emergency Department (HOSPITAL_COMMUNITY)
Admission: EM | Admit: 2021-06-18 | Discharge: 2021-06-18 | Disposition: A | Discharge status: Home / Self Care | Payer: BLUE CROSS/BLUE SHIELD | Attending: Emergency Medicine | Admitting: Emergency Medicine

## 2021-06-18 VITALS — BP 124/78 | HR 108 | Temp 97.8°F | Ht 70.0 in | Wt 249.4 lb

## 2021-06-18 DIAGNOSIS — R000 Tachycardia, unspecified: Secondary | ICD-10-CM | POA: Insufficient documentation

## 2021-06-18 DIAGNOSIS — R072 Precordial pain: Secondary | ICD-10-CM | POA: Diagnosis not present

## 2021-06-18 DIAGNOSIS — R457 State of emotional shock and stress, unspecified: Secondary | ICD-10-CM | POA: Diagnosis not present

## 2021-06-18 DIAGNOSIS — R079 Chest pain, unspecified: Secondary | ICD-10-CM

## 2021-06-18 DIAGNOSIS — U071 COVID-19: Secondary | ICD-10-CM | POA: Insufficient documentation

## 2021-06-18 DIAGNOSIS — J45909 Unspecified asthma, uncomplicated: Secondary | ICD-10-CM | POA: Insufficient documentation

## 2021-06-18 DIAGNOSIS — Z87891 Personal history of nicotine dependence: Secondary | ICD-10-CM | POA: Insufficient documentation

## 2021-06-18 DIAGNOSIS — R0789 Other chest pain: Secondary | ICD-10-CM

## 2021-06-18 DIAGNOSIS — Z20822 Contact with and (suspected) exposure to covid-19: Secondary | ICD-10-CM | POA: Diagnosis not present

## 2021-06-18 DIAGNOSIS — F3341 Major depressive disorder, recurrent, in partial remission: Secondary | ICD-10-CM

## 2021-06-18 DIAGNOSIS — F419 Anxiety disorder, unspecified: Secondary | ICD-10-CM | POA: Diagnosis not present

## 2021-06-18 DIAGNOSIS — R0902 Hypoxemia: Secondary | ICD-10-CM | POA: Diagnosis not present

## 2021-06-18 DIAGNOSIS — R509 Fever, unspecified: Secondary | ICD-10-CM

## 2021-06-18 LAB — COMPREHENSIVE METABOLIC PANEL
ALT: 36 U/L (ref 0–44)
AST: 22 U/L (ref 15–41)
Albumin: 4.2 g/dL (ref 3.5–5.0)
Alkaline Phosphatase: 101 U/L (ref 38–126)
Anion gap: 8 (ref 5–15)
BUN: 12 mg/dL (ref 6–20)
CO2: 24 mmol/L (ref 22–32)
Calcium: 9.4 mg/dL (ref 8.9–10.3)
Chloride: 103 mmol/L (ref 98–111)
Creatinine, Ser: 0.78 mg/dL (ref 0.61–1.24)
GFR, Estimated: >60 mL/min (ref >60)
Glucose, Bld: 98 mg/dL (ref 70–99)
Potassium: 4.2 mmol/L (ref 3.5–5.1)
Sodium: 135 mmol/L (ref 135–145)
Total Bilirubin: 0.5 mg/dL (ref 0.3–1.2)
Total Protein: 7.5 g/dL (ref 6.5–8.1)

## 2021-06-18 LAB — CBC WITH DIFFERENTIAL/PLATELET
Abs Immature Granulocytes: 0.33 10*3/uL — ABNORMAL HIGH (ref 0.00–0.07)
Basophils Absolute: 0.2 10*3/uL — ABNORMAL HIGH (ref 0.0–0.1)
Basophils Relative: 3 %
Eosinophils Absolute: 0.1 10*3/uL (ref 0.0–0.5)
Eosinophils Relative: 1 %
HCT: 44.9 % (ref 39.0–52.0)
Hemoglobin: 15.1 g/dL (ref 13.0–17.0)
Immature Granulocytes: 6 %
Lymphocytes Relative: 8 %
Lymphs Abs: 0.4 10*3/uL — ABNORMAL LOW (ref 0.7–4.0)
MCH: 29.8 pg (ref 26.0–34.0)
MCHC: 33.6 g/dL (ref 30.0–36.0)
MCV: 88.7 fL (ref 80.0–100.0)
Monocytes Absolute: 0.8 10*3/uL (ref 0.1–1.0)
Monocytes Relative: 14 %
Neutro Abs: 3.9 10*3/uL (ref 1.7–7.7)
Neutrophils Relative %: 68 %
Platelets: 239 10*3/uL (ref 150–400)
RBC: 5.06 MIL/uL (ref 4.22–5.81)
RDW: 12.1 % (ref 11.5–15.5)
WBC: 5.8 10*3/uL (ref 4.0–10.5)
nRBC: 0 % (ref 0.0–0.2)

## 2021-06-18 LAB — TROPONIN I (HIGH SENSITIVITY)
Troponin I (High Sensitivity): <2 ng/L (ref <18)
Troponin I (High Sensitivity): <2 ng/L (ref <18)

## 2021-06-18 MED ORDER — ACETAMINOPHEN 325 MG PO TABS
650.0000 mg | ORAL_TABLET | Freq: Once | ORAL | Status: AC
  Administered 2021-06-18: 650 mg via ORAL
  Filled 2021-06-18: qty 2

## 2021-06-18 MED ORDER — ALBUTEROL SULFATE HFA 108 (90 BASE) MCG/ACT IN AERS
2.0000 | INHALATION_SPRAY | Freq: Once | RESPIRATORY_TRACT | Status: AC
  Administered 2021-06-18: 2 via RESPIRATORY_TRACT
  Filled 2021-06-18: qty 6.7

## 2021-06-18 MED ORDER — ESCITALOPRAM OXALATE 10 MG PO TABS: 10.0000 mg | ORAL_TABLET | Freq: Every day | ORAL | 2 refills | Status: AC

## 2021-06-18 MED ORDER — VENLAFAXINE HCL ER 37.5 MG PO CP24
ORAL_CAPSULE | ORAL | 0 refills | Status: AC
Start: 2021-06-18 — End: 2021-07-16

## 2021-06-18 MED ORDER — HYDROXYZINE HCL 25 MG PO TABS: 25.0000 mg | ORAL_TABLET | Freq: Three times a day (TID) | ORAL | 5 refills | Status: AC

## 2021-06-18 NOTE — Discharge Instructions (Signed)
Return if any problems.

## 2021-06-18 NOTE — Progress Notes (Signed)
Assessment & Plan:  1. Chest pain, unspecified type Patient transported to the ER via EMS for further work-up.  EKG showed sinus tachycardia. - EKG 12-Lead  2-3. Anxiety/Recurrent major depressive disorder, in partial remission (HCC) Uncontrolled.  Patient to wean off Effexor by decreasing to 37.5 mg daily x2 weeks, then decrease to 37.5 mg every other day x2 weeks, then stop.  Advised to go ahead and start taking Lexapro 10 mg daily. - hydrOXYzine (ATARAX/VISTARIL) 25 MG tablet; Take 1 tablet (25 mg total) by mouth 3 (three) times daily.  Dispense: 90 tablet; Refill: 5 - venlafaxine XR (EFFEXOR XR) 37.5 MG 24 hr capsule; Take 1 capsule (37.5 mg total) by mouth daily for 14 days, THEN 1 capsule (37.5 mg total) every other day for 14 days.  Dispense: 21 capsule; Refill: 0 - escitalopram (LEXAPRO) 10 MG tablet; Take 1 tablet (10 mg total) by mouth daily.  Dispense: 30 tablet; Refill: 2   Return in about 6 weeks (around 07/30/2021) for depression/anxiety.  [name], MSN, APRN, FNP-C [facility]  Subjective:    Patient ID: Ernest Cochran, male    DOB: 07-Jan-1991, 30 y.o.   MRN: [id number]  Patient Care Team: Gwenlyn Fudge, FNP as PCP - General (Family Medicine) West Bali, MD (Inactive) as Consulting Physician (Gastroenterology)   Chief Complaint:  Chief Complaint  Patient presents with   Depression    Follow up.  Patient states that his depression has been worse since his back injury in March.    HPI: Ernest Cochran is a 30 y.o. male presenting on 06/18/2021 for Depression (Follow up.  Patient states that his depression has been worse since his back injury in March.)  Patient feels his depression is worsening.  Previously we increased his Effexor from 37.5 mg up to 75 mg once daily, which has not been helpful.  He previously failed treatment with Wellbutrin.  Depression screen [facility] 2/9 06/18/2021 04/06/2021 02/12/2020  Decreased Interest 2 1 0   Down, Depressed, Hopeless 1 1 1   PHQ - 2 Score 3 2 1   Altered sleeping 2 2 0  Tired, decreased energy 2 1 0  Change in appetite 1 3 1   Feeling bad or failure about yourself  2 1 0  Trouble concentrating 1 1 1   Moving slowly or fidgety/restless 1 0 3  Suicidal thoughts 0 0 0  PHQ-9 Score 12 10 6   Difficult doing work/chores Somewhat difficult Not difficult at all Not difficult at all   GAD 7 : Generalized Anxiety Score 06/18/2021 04/06/2021  Nervous, Anxious, on Edge 3 1  Control/stop worrying 2 1  Worry too much - different things 2 1  Trouble relaxing 2 1  Restless 2 0  Easily annoyed or irritable 2 2  Afraid - awful might happen 1 1  Total GAD 7 Score 14 7  Anxiety Difficulty Somewhat difficult Not difficult at all    New complaints: Patient reports midsternal chest pain that started last night and has continued today.  The pain is constant.  It does not radiate.  Additional symptoms include sweating, shortness of breath, tachycardia, feeling jittery, and vomiting last night.  He has not taken any medications.  Denies any acid reflux.   Social history:  Relevant past medical, surgical, family and social history reviewed and updated as indicated. Interim medical history since our last visit reviewed.  Allergies and medications reviewed and updated.  DATA REVIEWED: CHART IN EPIC  ROS: Negative unless  specifically indicated above in HPI.    Current Outpatient Medications:    hydrOXYzine (ATARAX/VISTARIL) 25 MG tablet, Take 1 tablet (25 mg total) by mouth 3 (three) times daily. (NEEDS TO BE SEEN BEFORE NEXT REFILL), Disp: 30 tablet, Rfl: 0   ibuprofen (ADVIL) 600 MG tablet, Take 1 tablet (600 mg total) by mouth every 6 (six) hours as needed., Disp: 30 tablet, Rfl: 0   naproxen (NAPROSYN) 500 MG tablet, Take by mouth daily as needed., Disp: , Rfl:    venlafaxine XR (EFFEXOR XR) 75 MG 24 hr capsule, Take 1 capsule (75 mg total) by mouth daily., Disp: 30 capsule, Rfl: 2   No  Known Allergies Past Medical History:  Diagnosis Date   Asthma    Depression    Eosinophilic esophagitis 2020   GERD (gastroesophageal reflux disease)    History of kidney stones     Past Surgical History:  Procedure Laterality Date   BIOPSY  01/07/2020   Procedure: BIOPSY;  Surgeon: West Bali, MD;  Location: [facility];  Service: Endoscopy;;  duodenum esophagus   ESOPHAGOGASTRODUODENOSCOPY (EGD) WITH PROPOFOL N/A 10/03/2019   Procedure: ESOPHAGOGASTRODUODENOSCOPY (EGD) WITH PROPOFOL;  Surgeon: West Bali, MD; LA grade B esophagitis s/p biopsy and dilation, mild gastritis s/p biopsy, and mild duodenitis.  Stomach biopsy with mild chronic inflammation negative for H. pylori.  Esophageal biopsy with inflamed squamous mucosa with increased eosinophils consistent with eosinophilic esophagitis.    ESOPHAGOGASTRODUODENOSCOPY (EGD) WITH PROPOFOL N/A 01/07/2020   Procedure: ESOPHAGOGASTRODUODENOSCOPY (EGD) WITH PROPOFOL;  Surgeon: West Bali, MD;  Esophageal mucosal changes secondary to eosinophilic esophagitis s/p biopsy, normal stomach, normal examined duodenum s/p biopsied.  Esophageal biopsies with increased intraepithelial eosinophils.  Duodenal bulb biopsy with peptic duodenitis, second part of duodenum with benign small bowel mucosa.    LUMBAR LAMINECTOMY/DECOMPRESSION MICRODISCECTOMY Right 05/19/2021   Procedure: Microdiscectomy -Lumbar Five-Sacral One Right;  Surgeon: Tia Alert, MD;  Location: [facility];  Service: Neurosurgery;  Laterality: Right;  3C   SAVORY DILATION N/A 10/03/2019   Procedure: SAVORY DILATION;  Surgeon: West Bali, MD;  Location: [facility];  Service: Endoscopy;  Laterality: N/A;  15/16/17    Social History   Socioeconomic History   Marital status: Married    Spouse name: Not on file   Number of children: Not on file   Years of education: Not on file   Highest education level: Not on file  Occupational History   Not on file  Tobacco Use    Smoking status: Former    Types: Cigarettes   Smokeless tobacco: Never  Vaping Use   Vaping Use: Never used  Substance and Sexual Activity   Alcohol use: Not Currently    Comment: occasional. 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    Drug use: Yes    Types: Marijuana    Comment: Last use 05/16/21   Sexual activity: Not on file  Other Topics Concern   Not on file  Social History Narrative   Not on file   Social Determinants of Health   Financial Resource Strain: Not on file  Food Insecurity: Not on file  Transportation Needs: Not on file  Physical Activity: Not on file  Stress: Not on file  Social Connections: Not on file  Intimate Partner Violence: Not on file        Objective:    BP 124/78   Pulse (!) 108   Temp 97.8 F (36.6 C) (  Temporal)   Ht 5\' 10"  (1.778 m)   Wt 249 lb 6.4 oz (113.1 kg)   SpO2 97%   BMI 35.79 kg/m   Wt Readings from Last 3 Encounters:  06/18/21 249 lb 6.4 oz (113.1 kg)  05/19/21 248 lb 9.6 oz (112.8 kg)  05/17/21 248 lb 9.6 oz (112.8 kg)    Physical Exam Vitals reviewed.  Constitutional:      General: He is not in acute distress.    Appearance: Normal appearance. He is obese. He is diaphoretic. He is not ill-appearing or toxic-appearing.  HENT:     Head: Normocephalic and atraumatic.  Eyes:     General: No scleral icterus.       Right eye: No discharge.        Left eye: No discharge.     Conjunctiva/sclera: Conjunctivae normal.  Cardiovascular:     Rate and Rhythm: Regular rhythm. Tachycardia present.     Heart sounds: Normal heart sounds. No murmur heard.   No friction rub. No gallop.  Pulmonary:     Effort: Pulmonary effort is normal. No respiratory distress.     Breath sounds: Normal breath sounds. No stridor. No wheezing, rhonchi or rales.  Musculoskeletal:        General: Normal range of motion.     Cervical back: Normal range of motion.  Skin:    General: Skin is warm.  Neurological:     Mental Status:  He is alert and oriented to person, place, and time. Mental status is at baseline.  Psychiatric:        Mood and Affect: Mood normal.        Behavior: Behavior normal.        Thought Content: Thought content normal.        Judgment: Judgment normal.   EKG: sinus tachycardia  No results found for: TSH Lab Results  Component Value Date   WBC 10.1 05/17/2021   HGB 15.6 05/17/2021   HCT 46.0 05/17/2021   MCV 87.8 05/17/2021   PLT 304 05/17/2021   Lab Results  Component Value Date   NA 139 05/17/2021   K 3.6 05/17/2021   CO2 25 05/17/2021   GLUCOSE 128 (H) 05/17/2021   BUN 11 05/17/2021   CREATININE 0.94 05/17/2021   BILITOT 0.6 01/01/2020   ALKPHOS 93 01/01/2020   AST 26 01/01/2020   ALT 32 01/01/2020   PROT 8.1 01/01/2020   ALBUMIN 4.4 01/01/2020   CALCIUM 9.1 05/17/2021   ANIONGAP 6 05/17/2021   No results found for: CHOL No results found for: HDL No results found for: LDLCALC No results found for: TRIG No results found for: CHOLHDL No results found for: 05/19/2021

## 2021-06-18 NOTE — ED Triage Notes (Addendum)
Pt states that he started having c/p last night about 7pm. EMS gave 324mg  of baby asa and 1 sublingual nitro.

## 2021-06-18 NOTE — ED Provider Notes (Signed)
[facility] EMERGENCY DEPARTMENT Provider Note   CSN: [id number] Arrival date & time: 06/18/21  1210     History Chief Complaint  Patient presents with   Chest Pain    MANVEER GOMES is a 30 y.o. male.  Pt reports   The history is provided by the patient. No language interpreter was used.  Chest Pain Pain location:  L chest and substernal area Pain quality: aching   Pain radiates to:  Does not radiate Pain severity:  Mild Progression:  Worsening Chronicity:  New Context: not breathing   Relieved by:  Nothing Worsened by:  Nothing Ineffective treatments:  None tried Associated symptoms: no cough and no vomiting   Risk factors: no high cholesterol and no hypertension     Pt seen by primary care today and sent here. Pt has a history of asthma.  He does not use an inhaler.    Past Medical History:  Diagnosis Date   Asthma    Depression    Eosinophilic esophagitis 2020   GERD (gastroesophageal reflux disease)    History of kidney stones     Patient Active Problem List   Diagnosis Date Noted   Eosinophilic esophagitis    Hematuria 12/25/2019   Depression    Dyspepsia 07/15/2019   Dysphagia 07/15/2019    Past Surgical History:  Procedure Laterality Date   BIOPSY  01/07/2020   Procedure: BIOPSY;  Surgeon: West Bali, MD;  [facility];  Service: Endoscopy;;  duodenum esophagus   ESOPHAGOGASTRODUODENOSCOPY (EGD) WITH PROPOFOL N/A 10/03/2019   Procedure: ESOPHAGOGASTRODUODENOSCOPY (EGD) WITH PROPOFOL;  Surgeon: West Bali, MD; LA grade B esophagitis s/p biopsy and dilation, mild gastritis s/p biopsy, and mild duodenitis.  Stomach biopsy with mild chronic inflammation negative for H. pylori.  Esophageal biopsy with inflamed squamous mucosa with increased eosinophils consistent with eosinophilic esophagitis.    ESOPHAGOGASTRODUODENOSCOPY (EGD) WITH PROPOFOL N/A 01/07/2020   Procedure: ESOPHAGOGASTRODUODENOSCOPY (EGD) WITH PROPOFOL;  Surgeon: West Bali, MD;  Esophageal mucosal changes secondary to eosinophilic esophagitis s/p biopsy, normal stomach, normal examined duodenum s/p biopsied.  Esophageal biopsies with increased intraepithelial eosinophils.  Duodenal bulb biopsy with peptic duodenitis, second part of duodenum with benign small bowel mucosa.    LUMBAR LAMINECTOMY/DECOMPRESSION MICRODISCECTOMY Right 05/19/2021   Procedure: Microdiscectomy -Lumbar Five-Sacral One Right;  Surgeon: Tia Alert, MD;  Location: [facility] OR;  Service: Neurosurgery;  Laterality: Right;  3C   SAVORY DILATION N/A 10/03/2019   Procedure: SAVORY DILATION;  Surgeon: West Bali, MD;  Location: [facility];  Service: Endoscopy;  Laterality: N/A;  15/16/17       Family History  Problem Relation Age of Onset   Colon cancer Maternal Grandfather    Diabetes Maternal Grandfather    Heart disease Maternal Grandfather    Lung cancer Maternal Grandmother    Asthma Son    Colon polyps Neg Hx     Social History   Tobacco Use   Smoking status: Former    Types: Cigarettes   Smokeless tobacco: Never  Vaping Use   Vaping Use: Never used  Substance Use Topics   Alcohol use: Not Currently    Comment: occasional. 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    Drug use: Yes    Types: Marijuana    Comment: Last use 05/16/21    Home Medications Prior to Admission medications   Medication Sig Start Date End Date Taking? Authorizing Provider  hydrOXYzine (ATARAX/VISTARIL)  25 MG tablet Take 1 tablet (25 mg total) by mouth 3 (three) times daily. 06/18/21  Yes [name] F, FNP  naproxen (NAPROSYN) 500 MG tablet Take 500 mg by mouth daily as needed for mild pain. 05/30/21  Yes [provider]  venlafaxine XR (EFFEXOR XR) 37.5 MG 24 hr capsule Take 1 capsule (37.5 mg total) by mouth daily for 14 days, THEN 1 capsule (37.5 mg total) every other day for 14 days. 06/18/21 07/16/21 Yes [name] F, FNP  escitalopram (LEXAPRO) 10 MG tablet  Take 1 tablet (10 mg total) by mouth daily. 06/18/21   Gwenlyn Fudge, FNP  ibuprofen (ADVIL) 600 MG tablet Take 1 tablet (600 mg total) by mouth every 6 (six) hours as needed. Patient not taking: Reported on 06/18/2021 02/12/21   Jacalyn Lefevre, MD    Allergies    Patient has no known allergies.  Review of Systems   Review of Systems  Constitutional:  Negative for chills.  Respiratory:  Negative for cough.   Cardiovascular:  Positive for chest pain.  Gastrointestinal:  Negative for vomiting.  Skin:  Negative for color change and rash.  All other systems reviewed and are negative.  Physical Exam Updated Vital Signs BP 118/62   Pulse 100   Temp 98.5 F (36.9 C) (Oral)   Resp 19   Ht 5\' 10"  (1.778 m)   Wt 112.9 kg   SpO2 97%   BMI 35.73 kg/m   Physical Exam  ED Results / Procedures / Treatments   Labs (all labs ordered are listed, but only abnormal results are displayed) Labs Reviewed  CBC WITH DIFFERENTIAL/PLATELET - Abnormal; Notable for the following components:      Result Value   Lymphs Abs 0.4 (*)    Basophils Absolute 0.2 (*)    Abs Immature Granulocytes 0.33 (*)    All other components within normal limits  SARS CORONAVIRUS 2 (TAT 6-24 HRS)  COMPREHENSIVE METABOLIC PANEL  CBC WITH DIFFERENTIAL/PLATELET  TROPONIN I (HIGH SENSITIVITY)  TROPONIN I (HIGH SENSITIVITY)    EKG EKG Interpretation  Date/Time:  Friday June 18 2021 12:17:36 EDT Ventricular Rate:  103 PR Interval:  140 QRS Duration: 94 QT Interval:  372 QTC Calculation: 487 R Axis:   70 Text Interpretation: Sinus tachycardia Borderline T abnormalities, anterior leads Borderline prolonged QT interval Baseline wander in lead(s) II III aVF Since last tracing rate faster Confirmed by [id number] (Eber Hong) on 06/18/2021 12:26:33 PM  Radiology DG Chest Port 1 View  Result Date: 06/18/2021 CLINICAL DATA:  Chest pain EXAM: PORTABLE CHEST 1 VIEW COMPARISON:  05/17/2021 FINDINGS: The heart size and  mediastinal contours are within normal limits. Both lungs are clear. The visualized skeletal structures are unremarkable. IMPRESSION: No acute abnormality of the lungs in [facility] portable projection. Electronically Signed   By: 05/19/2021 M.D.   On: 06/18/2021 13:30    Procedures Procedures   Medications Ordered in ED Medications  acetaminophen (TYLENOL) tablet 650 mg (has no administration in time range)  albuterol (VENTOLIN HFA) 108 (90 Base) MCG/ACT inhaler 2 puff (has no administration in time range)    ED Course  I have reviewed the triage vital signs and the nursing notes.  Pertinent labs & imaging results that were available during my care of the patient were reviewed by me and considered in my medical decision making (see chart for details).    MDM Rules/Calculators/A&P  MDM:  Chest xray no acute.  Ekg  no st elevation  troponin negative x 2.  On reevaluation pt has temp 39f 100.4,  Pt given tylenol.  Pt mentions a family member has cancer.  I will send covid test.   Final Clinical Impression(s) / ED Diagnoses Final diagnoses:  Atypical chest pain  Fever, unspecified fever cause    Rx / DC Orders ED Discharge Orders     None        Osie Cheeks 06/18/21 1809    Pollyann Savoy, MD 06/19/21 1454

## 2021-06-19 ENCOUNTER — Encounter: Payer: Self-pay | Admitting: Family Medicine

## 2021-06-19 LAB — SARS CORONAVIRUS 2 (TAT 6-24 HRS): SARS Coronavirus 2: POSITIVE — AB

## 2021-06-21 ENCOUNTER — Telehealth: Payer: Self-pay

## 2021-06-21 NOTE — Telephone Encounter (Signed)
Transition Care Management Unsuccessful Follow-up Telephone Call  Date of discharge and from where:  06/18/2021-[facility]   Attempts:  1st Attempt  Reason for unsuccessful TCM follow-up call:  Left voice message    

## 2021-06-22 NOTE — Telephone Encounter (Signed)
Transition Care Management Unsuccessful Follow-up Telephone Call  Date of discharge and from where:  06/18/2021-[facility]   Attempts:  2nd Attempt  Reason for unsuccessful TCM follow-up call:  Left voice message

## 2021-06-23 NOTE — Telephone Encounter (Signed)
Transition Care Management Unsuccessful Follow-up Telephone Call  Date of discharge and from where:  06/18/2021[facility]   Attempts:  3rd Attempt  Reason for unsuccessful TCM follow-up call:  Left voice message

## 2021-06-27 ENCOUNTER — Other Ambulatory Visit: Payer: Self-pay | Admitting: Family Medicine

## 2021-06-27 DIAGNOSIS — F3341 Major depressive disorder, recurrent, in partial remission: Secondary | ICD-10-CM

## 2021-08-20 IMAGING — DX DG LUMBAR SPINE COMPLETE 4+V
5 series · 5 of 5 positions shown · non-contrast
Comparison: 11/18/2017

CLINICAL DATA: Back injury lifting heavy cabinet today. Low back
pain. Initial encounter.

EXAM:
LUMBAR SPINE - COMPLETE 4+ VIEW

[lumbar spine ap]
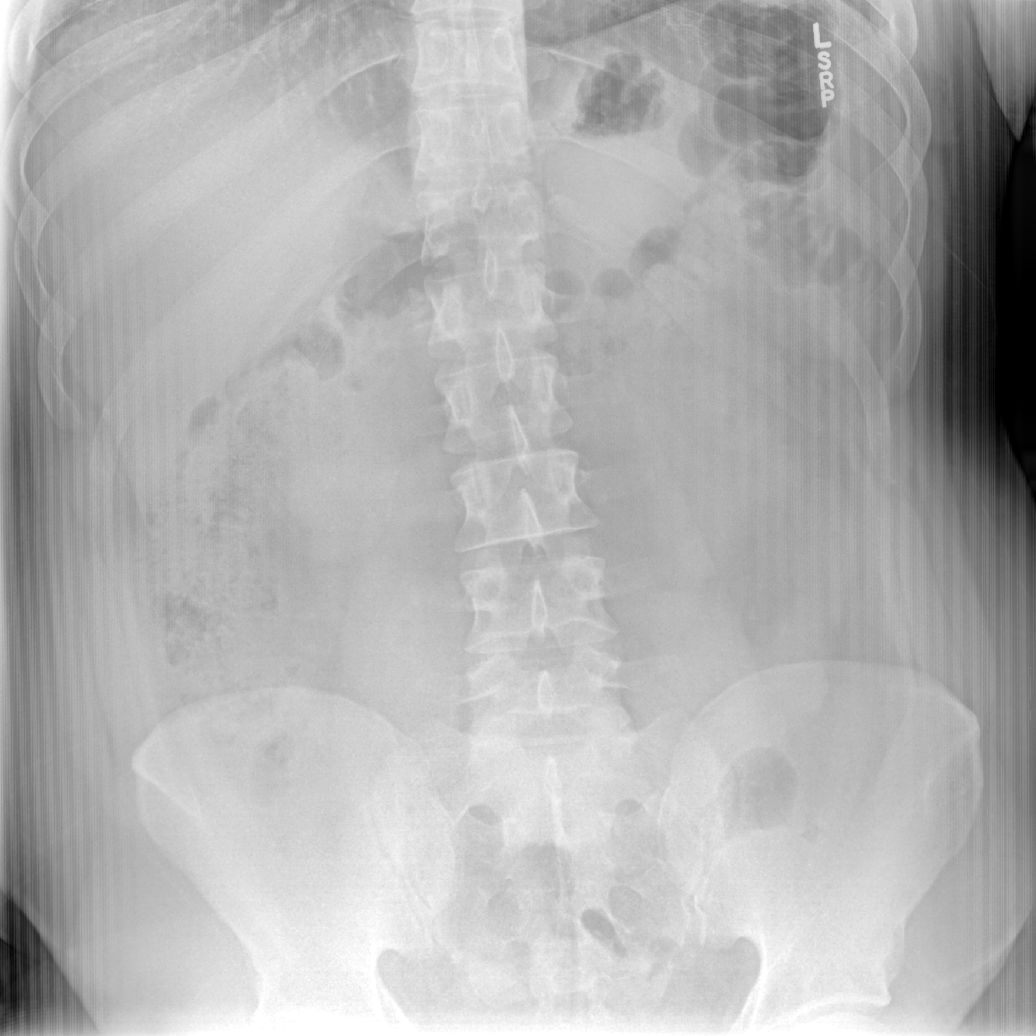

[lumbar spine mlo (1 of 2)]
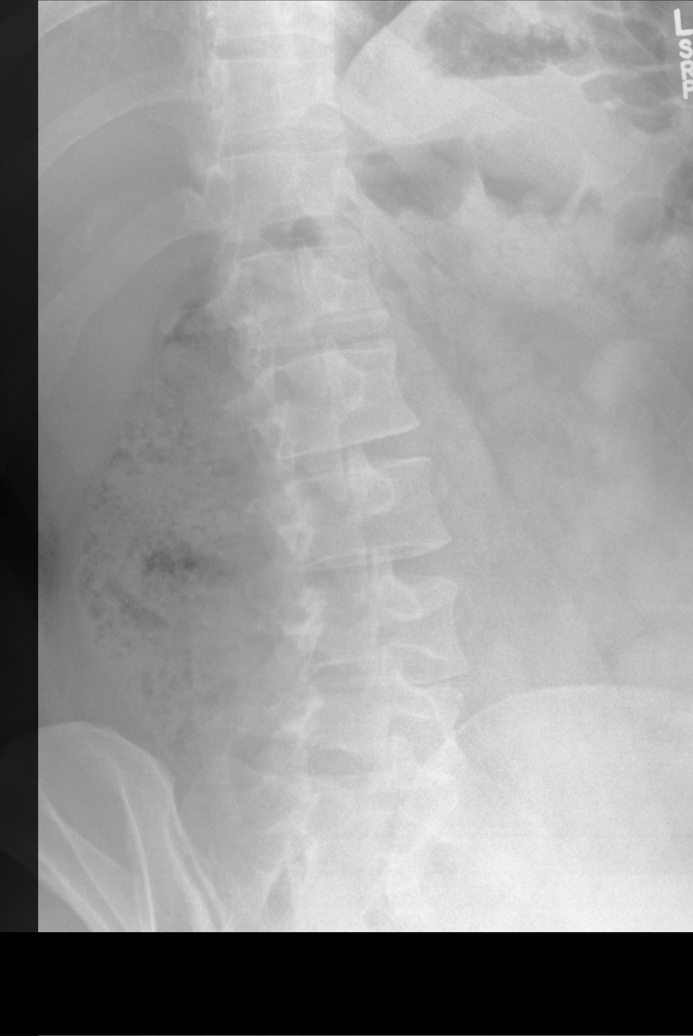

[lumbar spine mlo (2 of 2)]
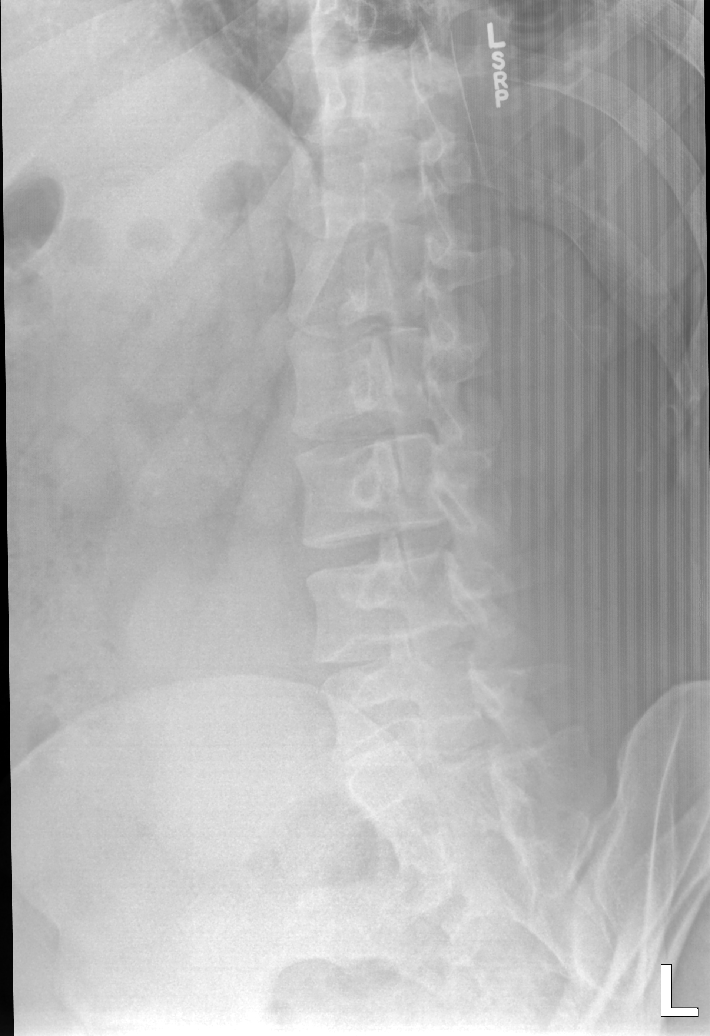

[lumbar spine lat (1 of 2)]
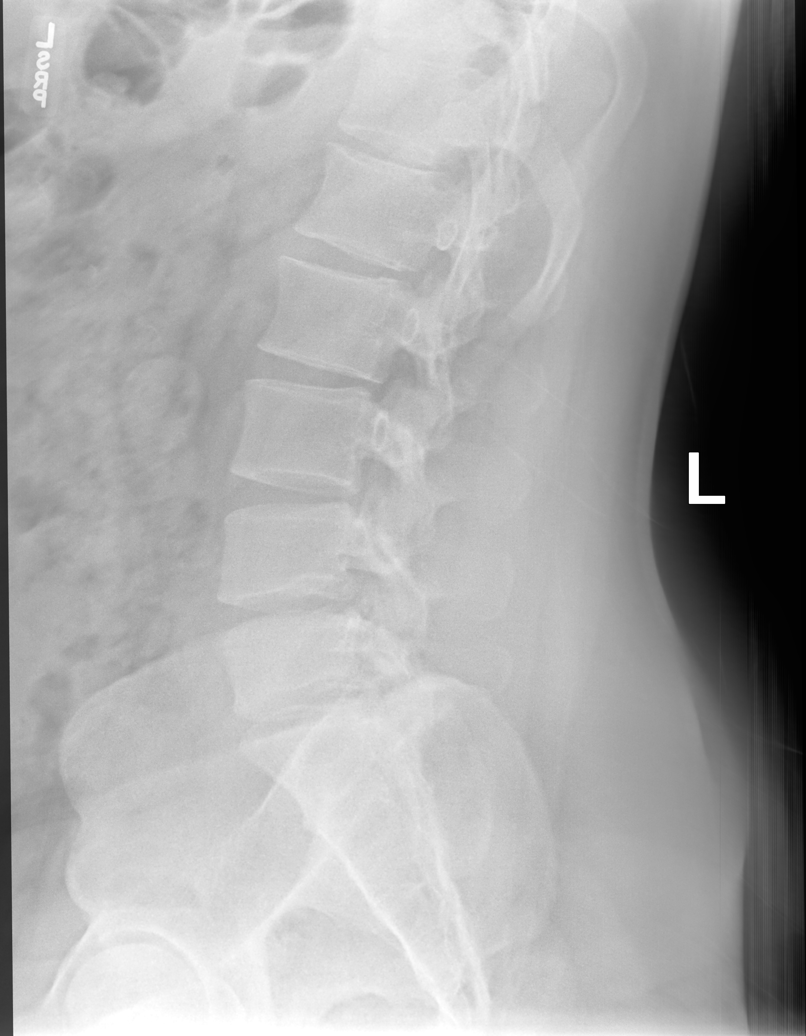

[lumbar spine lat (2 of 2)]
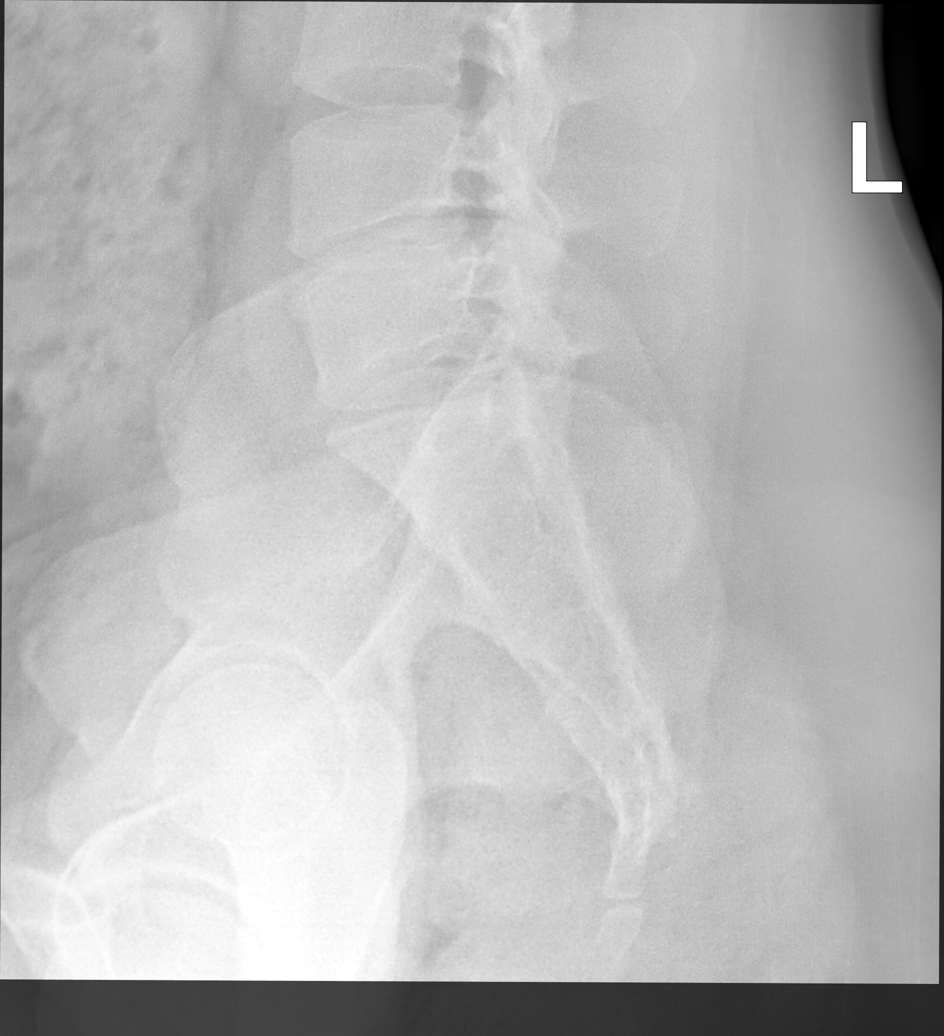

[5 of 5 positions shown; findings below may reference images not displayed]

FINDINGS: There is no evidence of lumbar spine fracture. Alignment is normal.
Intervertebral disc spaces are maintained.
IMPRESSION: Negative.

## 2022-02-21 ENCOUNTER — Ambulatory Visit: Payer: Self-pay

## 2022-06-10 IMAGING — DX DG ANKLE COMPLETE 3+V*L*
3 series · 3 of 3 positions shown · non-contrast
Comparison: September 01, 2005

CLINICAL DATA: Injury to the left ankle yesterday with pain.

EXAM:
LEFT ANKLE COMPLETE - 3+ VIEW

[ankle ap]
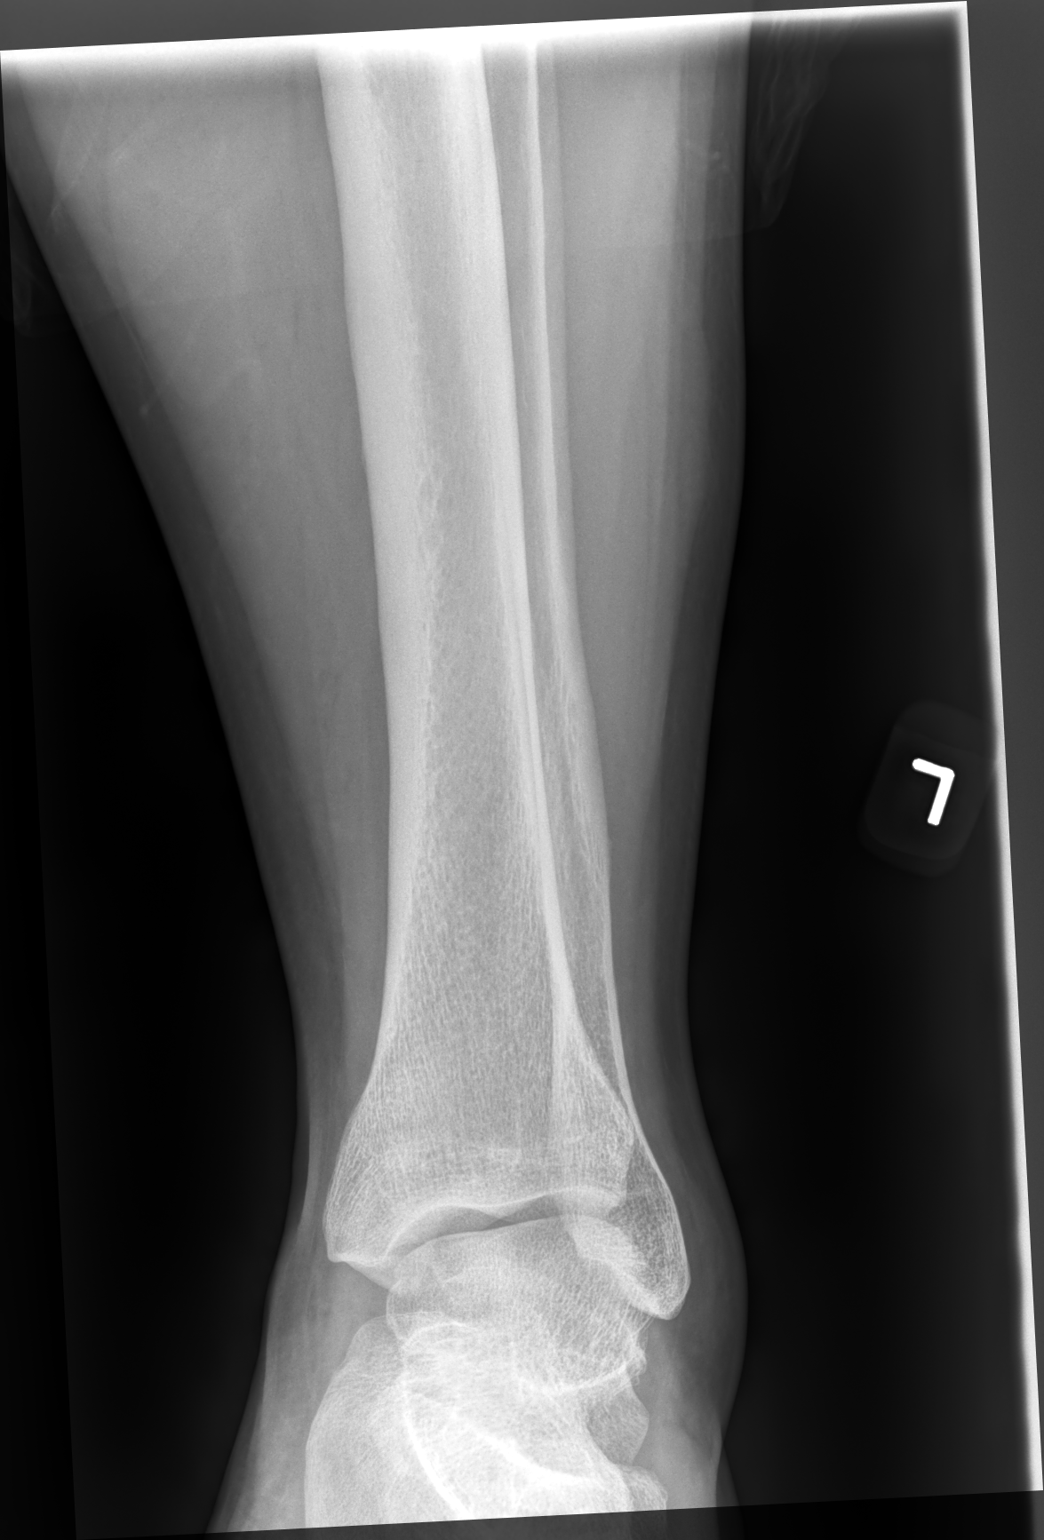

[ankle mlo]
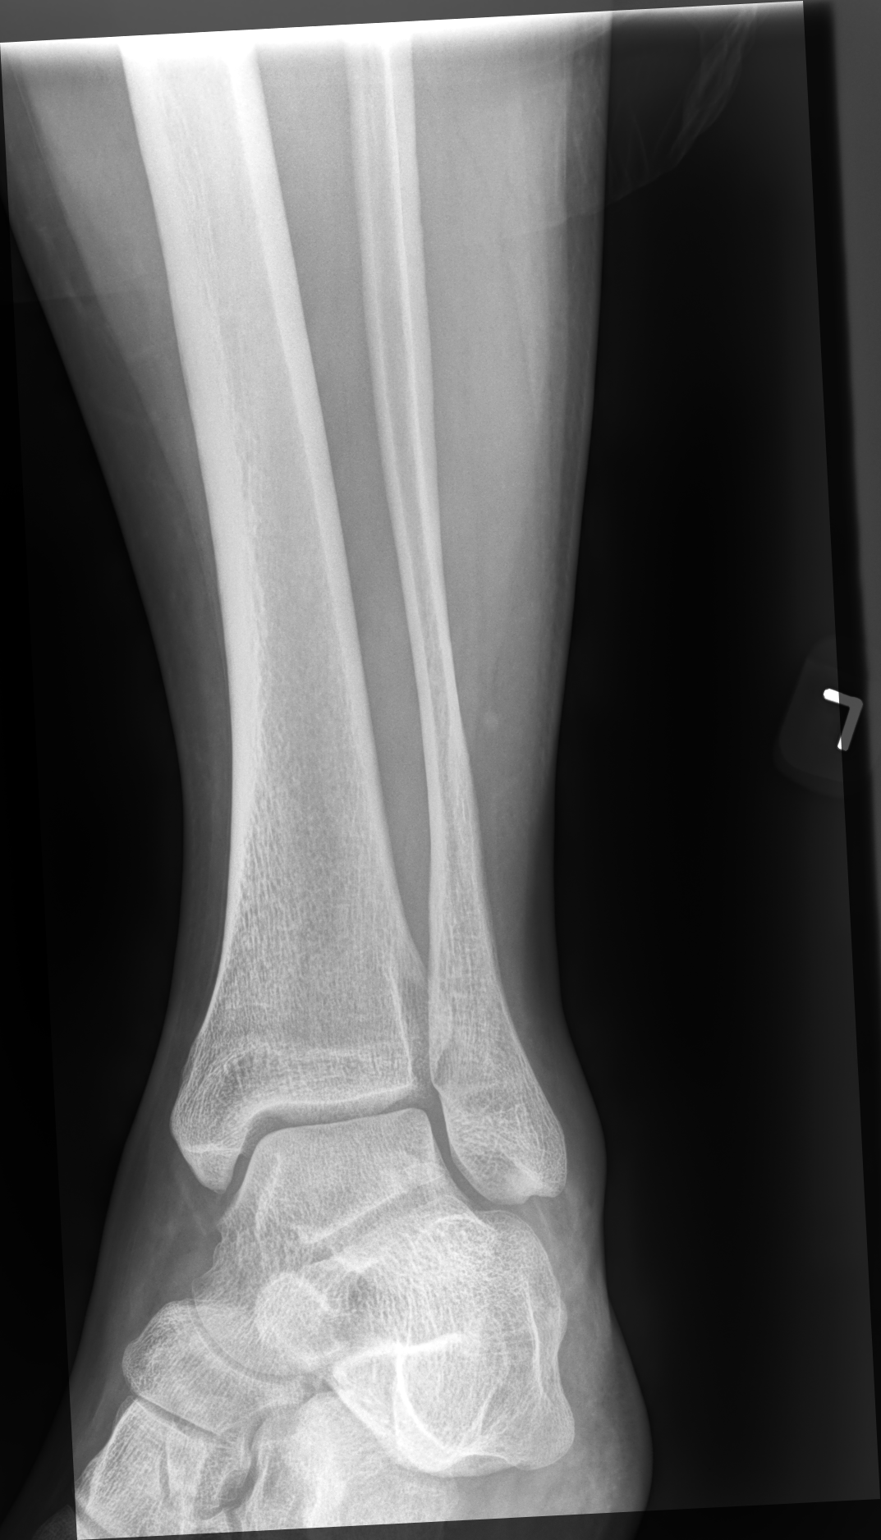

[ankle lat]
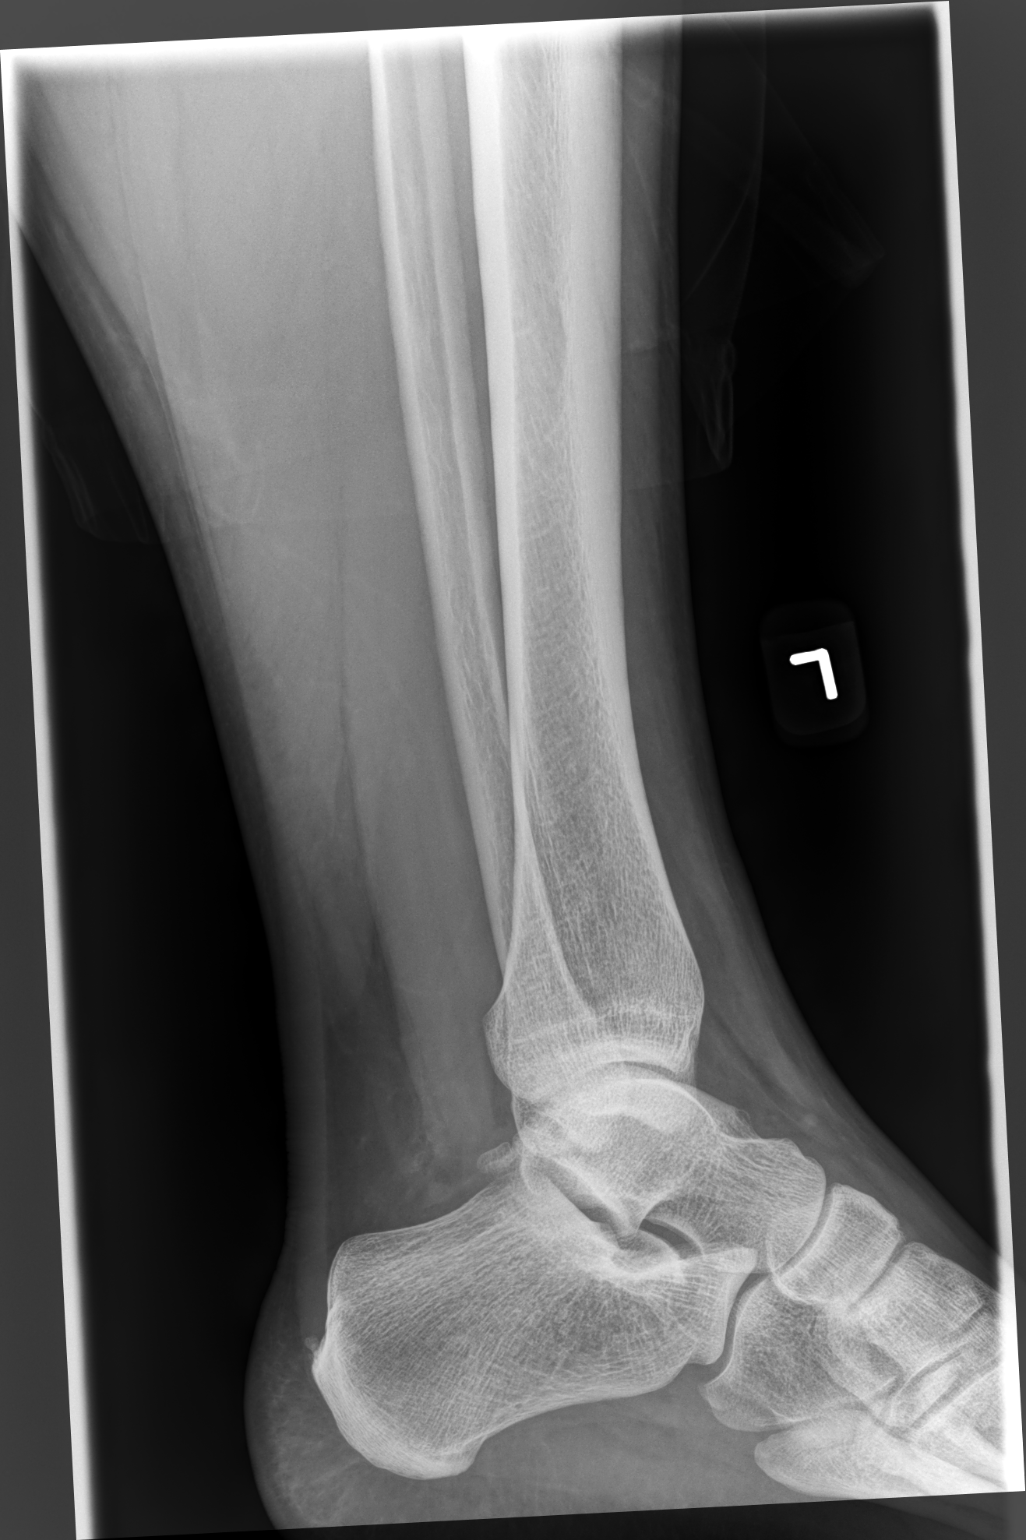

[3 of 3 positions shown; findings below may reference images not displayed]

FINDINGS: There is no evidence of fracture, dislocation, or joint effusion.
There is no evidence of arthropathy or other focal bone abnormality.
Mild soft tissue swelling over lateral left ankle is identified.
IMPRESSION: No acute fracture or dislocation. Mild soft tissue swelling over
lateral left ankle.

## 2022-10-12 DIAGNOSIS — K219 Gastro-esophageal reflux disease without esophagitis: Secondary | ICD-10-CM | POA: Diagnosis not present

## 2022-10-12 DIAGNOSIS — F418 Other specified anxiety disorders: Secondary | ICD-10-CM | POA: Diagnosis not present

## 2022-10-12 DIAGNOSIS — Z0189 Encounter for other specified special examinations: Secondary | ICD-10-CM | POA: Diagnosis not present

## 2022-10-12 DIAGNOSIS — Z131 Encounter for screening for diabetes mellitus: Secondary | ICD-10-CM | POA: Diagnosis not present

## 2022-10-16 ENCOUNTER — Ambulatory Visit
Admission: EM | Admit: 2022-10-16 | Discharge: 2022-10-16 | Disposition: A | Discharge status: Home / Self Care | Payer: BC Managed Care – PPO | Attending: Nurse Practitioner | Admitting: Nurse Practitioner

## 2022-10-16 DIAGNOSIS — R6889 Other general symptoms and signs: Secondary | ICD-10-CM | POA: Diagnosis not present

## 2022-10-16 DIAGNOSIS — Z20828 Contact with and (suspected) exposure to other viral communicable diseases: Secondary | ICD-10-CM | POA: Diagnosis not present

## 2022-10-16 DIAGNOSIS — J029 Acute pharyngitis, unspecified: Secondary | ICD-10-CM | POA: Diagnosis not present

## 2022-10-16 DIAGNOSIS — Z1152 Encounter for screening for COVID-19: Secondary | ICD-10-CM | POA: Diagnosis not present

## 2022-10-16 LAB — RESP PANEL BY RT-PCR (FLU A&B, COVID) ARPGX2
Influenza A by PCR: NEGATIVE
Influenza B by PCR: POSITIVE — AB
SARS Coronavirus 2 by RT PCR: NEGATIVE

## 2022-10-16 LAB — POCT RAPID STREP A (OFFICE): Rapid Strep A Screen: NEGATIVE

## 2022-10-16 MED ORDER — FLUTICASONE PROPIONATE 50 MCG/ACT NA SUSP: 2.0000 | Freq: Every day | NASAL | 0 refills | Status: AC

## 2022-10-16 MED ORDER — ONDANSETRON 4 MG PO TBDP
4.0000 mg | ORAL_TABLET | Freq: Once | ORAL | Status: AC
  Administered 2022-10-16: 4 mg via ORAL

## 2022-10-16 MED ORDER — OSELTAMIVIR PHOSPHATE 75 MG PO CAPS: 75.0000 mg | ORAL_CAPSULE | Freq: Two times a day (BID) | ORAL | 0 refills | Status: AC

## 2022-10-16 MED ORDER — ONDANSETRON HCL 4 MG PO TABS: 4.0000 mg | ORAL_TABLET | Freq: Three times a day (TID) | ORAL | 0 refills | Status: AC | PRN

## 2022-10-16 MED ORDER — PROMETHAZINE-DM 6.25-15 MG/5ML PO SYRP: 5.0000 mL | ORAL_SOLUTION | Freq: Four times a day (QID) | ORAL | 0 refills | Status: AC | PRN

## 2022-10-16 NOTE — Discharge Instructions (Addendum)
COVID/flu test is pending.  As discussed, you need to start the antiviral therapy tonight to achieve the most effectiveness.  If your pending test results are negative for flu, you can stop the antiviral therapy. Take medication as prescribed. Increase fluids and allow for plenty of rest. Recommend Profen or Tylenol as needed for pain, fever, or general discomfort. Recommend using a humidifier in the bedroom at nighttime to help with cough and nasal congestion. Also recommend sleeping elevated on 2 pillows while symptoms persist. Please be advised that flulike symptoms can persist for up to 14 days.  You have been provided a note for 3 days excuse you from work.  Should you have symptoms beyond that timeframe, you will need to follow-up with your primary care physician. Follow-up with your primary care physician if symptoms fail to improve. Follow-up as needed.

## 2022-10-16 NOTE — ED Triage Notes (Signed)
Pt reports cough, sore throat, body aches, fever 101.0 F. Pt reporst his kids had Flu 1 week ago.

## 2022-10-16 NOTE — ED Provider Notes (Signed)
[facility]    CSN: [id number] Arrival date & time: 10/16/22  1142      History   Chief Complaint Chief Complaint  Patient presents with   Fever   Sore Throat          HPI Ernest Cochran is a 31 y.o. male.   The history is provided by the patient.   Patient presents with a 2-day history of fever, cough, sore throat, body aches, and vomiting.  He states that he vomited starting today.  Fever was as high as 101 2 days ago.  Patient reports his daughter was diagnosed with influenza last week.  Patient denies wheezing, abdominal pain, diarrhea, or constipation.  He states he has been taking Robitussin and TheraFlu for his symptoms.  Past Medical History:  Diagnosis Date   Asthma    Depression    Eosinophilic esophagitis [id number]   GERD (gastroesophageal reflux disease)    History of kidney stones     Patient Active Problem List   Diagnosis Date Noted   Eosinophilic esophagitis    Hematuria 12/25/2019   Depression    Dyspepsia 07/15/2019   Dysphagia 07/15/2019    Past Surgical History:  Procedure Laterality Date   BIOPSY  01/07/2020   Procedure: BIOPSY;  Surgeon: Danie Binder, MD;  [facility];  Service: Endoscopy;;  duodenum esophagus   ESOPHAGOGASTRODUODENOSCOPY (EGD) WITH PROPOFOL N/A 10/03/2019   Procedure: ESOPHAGOGASTRODUODENOSCOPY (EGD) WITH PROPOFOL;  Surgeon: Danie Binder, MD; LA grade B esophagitis s/p biopsy and dilation, mild gastritis s/p biopsy, and mild duodenitis.  Stomach biopsy with mild chronic inflammation negative for H. pylori.  Esophageal biopsy with inflamed squamous mucosa with increased eosinophils consistent with eosinophilic esophagitis.    ESOPHAGOGASTRODUODENOSCOPY (EGD) WITH PROPOFOL N/A 01/07/2020   Procedure: ESOPHAGOGASTRODUODENOSCOPY (EGD) WITH PROPOFOL;  Surgeon: Danie Binder, MD;  Esophageal mucosal changes secondary to eosinophilic esophagitis s/p biopsy, normal stomach, normal examined duodenum s/p  biopsied.  Esophageal biopsies with increased intraepithelial eosinophils.  Duodenal bulb biopsy with peptic duodenitis, second part of duodenum with benign small bowel mucosa.    LUMBAR LAMINECTOMY/DECOMPRESSION MICRODISCECTOMY Right 05/19/2021   Procedure: Microdiscectomy -Lumbar Five-Sacral One Right;  Surgeon: Eustace Moore, MD;  Location: [place];  Service: Neurosurgery;  Laterality: Right;  3C   SAVORY DILATION N/A 10/03/2019   Procedure: SAVORY DILATION;  Surgeon: Danie Binder, MD;  [facility];  Service: Endoscopy;  Laterality: N/A;  15/16/17       Home Medications    Prior to Admission medications   Medication Sig Start Date End Date Taking? Authorizing Provider  fluticasone (FLONASE) 50 MCG/ACT nasal spray Place 2 sprays into both nostrils daily. 10/16/22  Yes Praise Dolecki-Warren, Alda Lea, NP  ondansetron (ZOFRAN) 4 MG tablet Take 1 tablet (4 mg total) by mouth every 8 (eight) hours as needed for nausea or vomiting. 10/16/22  Yes Buddie Marston-Warren, Alda Lea, NP  oseltamivir (TAMIFLU) 75 MG capsule Take 1 capsule (75 mg total) by mouth every 12 (twelve) hours. 10/16/22  Yes April Carlyon-Warren, Alda Lea, NP  promethazine-dextromethorphan (PROMETHAZINE-DM) 6.25-15 MG/5ML syrup Take 5 mLs by mouth 4 (four) times daily as needed for cough. 10/16/22  Yes Haydn Cush-Warren, Alda Lea, NP  escitalopram (LEXAPRO) 10 MG tablet Take 1 tablet (10 mg total) by mouth daily. 06/18/21   Loman Brooklyn, FNP  hydrOXYzine (ATARAX/VISTARIL) 25 MG tablet Take 1 tablet (25 mg total) by mouth 3 (three) times daily. 06/18/21   Loman Brooklyn, FNP  ibuprofen (ADVIL) 600 MG tablet Take 1 tablet (600 mg total) by mouth every 6 (six) hours as needed. Patient not taking: Reported on 06/18/2021 02/12/21   Jacalyn Lefevre, MD  naproxen (NAPROSYN) 500 MG tablet Take 500 mg by mouth daily as needed for mild pain. 05/30/21   [provider]  venlafaxine XR (EFFEXOR XR) 37.5 MG 24 hr capsule Take 1 capsule (37.5  mg total) by mouth daily for 14 days, THEN 1 capsule (37.5 mg total) every other day for 14 days. 06/18/21 07/16/21  Gwenlyn Fudge, FNP    Family History Family History  Problem Relation Age of Onset   Colon cancer Maternal Grandfather    Diabetes Maternal Grandfather    Heart disease Maternal Grandfather    Lung cancer Maternal Grandmother    Asthma Son    Colon polyps Neg Hx     Social History Social History   Tobacco Use   Smoking status: Former    Types: Cigarettes   Smokeless tobacco: Never  Vaping Use   Vaping Use: Never used  Substance Use Topics   Alcohol use: Not Currently    Comment: occasional. 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    Drug use: Yes    Types: Marijuana    Comment: Last use 05/16/21     Allergies   Patient has no known allergies.   Review of Systems Review of Systems Per HPI  Physical Exam Triage Vital Signs ED Triage Vitals  Enc Vitals Group     BP 10/16/22 1255 110/77     Pulse Rate 10/16/22 1255 96     Resp 10/16/22 1255 18     Temp 10/16/22 1255 98.9 F (37.2 C)     Temp Source 10/16/22 1255 Oral     SpO2 10/16/22 1255 97 %     Weight --      Height --      Head Circumference --      Peak Flow --      Pain Score 10/16/22 1258 10     Pain Loc --      Pain Edu? --      Excl. in GC? --    No data found.  Updated Vital Signs BP 110/77 (BP Location: Right Arm)   Pulse 96   Temp 98.9 F (37.2 C) (Oral)   Resp 18   SpO2 97%   Visual Acuity Right Eye Distance:   Left Eye Distance:   Bilateral Distance:    Right Eye Near:   Left Eye Near:    Bilateral Near:     Physical Exam Vitals and nursing note reviewed.  Constitutional:      General: He is not in acute distress.    Appearance: He is well-developed.  HENT:     Head: Normocephalic.     Right Ear: Tympanic membrane and ear canal normal.     Left Ear: Tympanic membrane and ear canal normal.     Nose: Congestion and rhinorrhea present.      Mouth/Throat:     Pharynx: Uvula midline. Pharyngeal swelling and posterior oropharyngeal erythema present.     Tonsils: No tonsillar exudate. 1+ on the right. 1+ on the left.  Eyes:     Conjunctiva/sclera: Conjunctivae normal.     Pupils: Pupils are equal, round, and reactive to light.  Cardiovascular:     Rate and Rhythm: Regular rhythm.     Heart sounds: Normal heart sounds.  Pulmonary:  Effort: Pulmonary effort is normal. No respiratory distress.     Breath sounds: Normal breath sounds. No stridor. No wheezing, rhonchi or rales.  Abdominal:     General: Bowel sounds are normal.     Palpations: Abdomen is soft.     Tenderness: There is no abdominal tenderness.  Musculoskeletal:     Cervical back: Normal range of motion.  Lymphadenopathy:     Cervical: No cervical adenopathy.  Skin:    General: Skin is warm and dry.  Neurological:     General: No focal deficit present.     Mental Status: He is alert and oriented to person, place, and time.  Psychiatric:        Mood and Affect: Mood normal.        Behavior: Behavior normal.      UC Treatments / Results  Labs (all labs ordered are listed, but only abnormal results are displayed) Labs Reviewed  RESP PANEL BY RT-PCR (FLU A&B, COVID) [id number]  POCT RAPID STREP A (OFFICE)    EKG   Radiology No results found.  Procedures Procedures (including critical care time)  Medications Ordered in UC Medications  ondansetron (ZOFRAN-ODT) disintegrating tablet 4 mg (4 mg Oral Given 10/16/22 1302)    Initial Impression / Assessment and Plan / UC Course  I have reviewed the triage vital signs and the nursing notes.  Pertinent labs & imaging results that were available during my care of the patient were reviewed by me and considered in my medical decision making (see chart for details).  Presents with 2-day history of flulike symptoms.  Patient with known recent exposure from his children.  Patient's vital signs are stable, he  is in no acute distress, he is well-appearing.  We will treat patient's flulike symptoms due to known exposure from influenza. Will provide prescription for Tamiflu 75 mg, fluticasone nasal spray 50 mcg, and Promethazine DM for his cough.  Patient will also be provided prescription for Zofran 4 mg to help with nausea.  Supportive care recommendations were provided to the patient.  But strep test is negative, COVID/flu test is pending.  No clinical indication for a throat culture at this time.  Advised patient that he can start medication for influenza, but it is recommended that it be started today.  Patient was advised he will be contacted if the pending test results are positive.  Patient verbalizes understanding.  All questions were answered.  Work note was provided. Final Clinical Impressions(s) / UC Diagnoses   Final diagnoses:  Flu-like symptoms  Exposure to the flu     Discharge Instructions      COVID/flu test is pending.  As discussed, you need to start the antiviral therapy tonight to achieve the most effectiveness.  If your pending test results are negative for flu, you can stop the antiviral therapy. Take medication as prescribed. Increase fluids and allow for plenty of rest. Recommend Profen or Tylenol as needed for pain, fever, or general discomfort. Recommend using a humidifier in the bedroom at nighttime to help with cough and nasal congestion. Also recommend sleeping elevated on 2 pillows while symptoms persist. Please be advised that flulike symptoms can persist for up to 14 days.  You have been provided a note for 3 days excuse you from work.  Should you have symptoms beyond that timeframe, you will need to follow-up with your primary care physician. Follow-up with your primary care physician if symptoms fail to improve. Follow-up as needed.  ED Prescriptions     Medication Sig Dispense Auth. Provider   promethazine-dextromethorphan (PROMETHAZINE-DM) 6.25-15 MG/5ML  syrup Take 5 mLs by mouth 4 (four) times daily as needed for cough. 140 mL Tyeson Tanimoto-Warren, Alda Lea, NP   fluticasone (FLONASE) 50 MCG/ACT nasal spray Place 2 sprays into both nostrils daily. 16 g Zailen Albarran-Warren, Alda Lea, NP   oseltamivir (TAMIFLU) 75 MG capsule Take 1 capsule (75 mg total) by mouth every 12 (twelve) hours. 10 capsule Kaylise Blakeley-Warren, Alda Lea, NP   ondansetron (ZOFRAN) 4 MG tablet Take 1 tablet (4 mg total) by mouth every 8 (eight) hours as needed for nausea or vomiting. 20 tablet Nissim Fleischer-Warren, Alda Lea, NP      PDMP not reviewed this encounter.   Tish Men, NP 10/16/22 1526

## 2022-11-01 DIAGNOSIS — F418 Other specified anxiety disorders: Secondary | ICD-10-CM | POA: Diagnosis not present

## 2022-11-01 DIAGNOSIS — Z131 Encounter for screening for diabetes mellitus: Secondary | ICD-10-CM | POA: Diagnosis not present

## 2022-11-07 DIAGNOSIS — F418 Other specified anxiety disorders: Secondary | ICD-10-CM | POA: Diagnosis not present

## 2022-11-07 DIAGNOSIS — E785 Hyperlipidemia, unspecified: Secondary | ICD-10-CM | POA: Diagnosis not present

## 2022-11-07 DIAGNOSIS — K219 Gastro-esophageal reflux disease without esophagitis: Secondary | ICD-10-CM | POA: Diagnosis not present

## 2022-11-07 DIAGNOSIS — Z0000 Encounter for general adult medical examination without abnormal findings: Secondary | ICD-10-CM | POA: Diagnosis not present

## 2023-05-29 ENCOUNTER — Ambulatory Visit
Admission: EM | Admit: 2023-05-29 | Discharge: 2023-05-29 | Disposition: A | Discharge status: Home / Self Care | Payer: BC Managed Care – PPO

## 2023-05-29 DIAGNOSIS — S46211A Strain of muscle, fascia and tendon of other parts of biceps, right arm, initial encounter: Secondary | ICD-10-CM | POA: Diagnosis not present

## 2023-05-29 NOTE — ED Triage Notes (Signed)
Pt reports after going bowling yesterday he cannot straighten his right arm out and when he does it burns like a nerve.

## 2023-05-29 NOTE — Discharge Instructions (Signed)
Recommend ice 15 minutes on, 45 minutes off every hour while awake for the next 48 hours.  Continue naproxen as needed for pain.  Follow-up with sports medicine with no improvement or worsening of symptoms despite treatment.

## 2023-05-29 NOTE — ED Provider Notes (Signed)
[facility]    CSN: [id number] Arrival date & time: 05/29/23  1534      History   Chief Complaint No chief complaint on file.   HPI Ernest Cochran is a 32 y.o. male.   Patient presents today with right arm pain that he noticed after bowling all day yesterday.  Reports it is difficult to straighten the arm out because of pain.  He is having pain in his upper arm and noticed bulging and "shivering" of the skin last night while bowling.  No redness, swelling, obvious bruising, or obvious deformity.  No numbness or tingling in the fingertips or shooting pain down the arm.  No weakness or decreased sensation of the right upper extremity.  Has taking naproxen which seem to help with the pain temporarily.    Past Medical History:  Diagnosis Date   Asthma    Depression    Eosinophilic esophagitis 2020   GERD (gastroesophageal reflux disease)    History of kidney stones     Patient Active Problem List   Diagnosis Date Noted   Eosinophilic esophagitis    Hematuria 12/25/2019   Depression    Dyspepsia 07/15/2019   Dysphagia 07/15/2019    Past Surgical History:  Procedure Laterality Date   BIOPSY  01/07/2020   Procedure: BIOPSY;  Surgeon: West Bali, MD;  Location: [facility];  Service: Endoscopy;;  duodenum esophagus   ESOPHAGOGASTRODUODENOSCOPY (EGD) WITH PROPOFOL N/A 10/03/2019   Procedure: ESOPHAGOGASTRODUODENOSCOPY (EGD) WITH PROPOFOL;  Surgeon: West Bali, MD; LA grade B esophagitis s/p biopsy and dilation, mild gastritis s/p biopsy, and mild duodenitis.  Stomach biopsy with mild chronic inflammation negative for H. pylori.  Esophageal biopsy with inflamed squamous mucosa with increased eosinophils consistent with eosinophilic esophagitis.    ESOPHAGOGASTRODUODENOSCOPY (EGD) WITH PROPOFOL N/A 01/07/2020   Procedure: ESOPHAGOGASTRODUODENOSCOPY (EGD) WITH PROPOFOL;  Surgeon: West Bali, MD;  Esophageal mucosal changes secondary to eosinophilic  esophagitis s/p biopsy, normal stomach, normal examined duodenum s/p biopsied.  Esophageal biopsies with increased intraepithelial eosinophils.  Duodenal bulb biopsy with peptic duodenitis, second part of duodenum with benign small bowel mucosa.    LUMBAR LAMINECTOMY/DECOMPRESSION MICRODISCECTOMY Right 05/19/2021   Procedure: Microdiscectomy -Lumbar Five-Sacral One Right;  Surgeon: Tia Alert, MD;  Location: [facility] OR;  Service: Neurosurgery;  Laterality: Right;  3C   SAVORY DILATION N/A 10/03/2019   Procedure: SAVORY DILATION;  Surgeon: West Bali, MD;  Location: [facility];  Service: Endoscopy;  Laterality: N/A;  15/16/17       Home Medications    Prior to Admission medications   Medication Sig Start Date End Date Taking? Authorizing Provider  escitalopram (LEXAPRO) 10 MG tablet Take 1 tablet (10 mg total) by mouth daily. 06/18/21   Gwenlyn Fudge, FNP  fluticasone (FLONASE) 50 MCG/ACT nasal spray Place 2 sprays into both nostrils daily. 10/16/22   Leath-Warren, Sadie Haber, NP  hydrOXYzine (ATARAX/VISTARIL) 25 MG tablet Take 1 tablet (25 mg total) by mouth 3 (three) times daily. 06/18/21   Gwenlyn Fudge, FNP  ibuprofen (ADVIL) 600 MG tablet Take 1 tablet (600 mg total) by mouth every 6 (six) hours as needed. Patient not taking: Reported on 06/18/2021 02/12/21   Jacalyn Lefevre, MD  naproxen (NAPROSYN) 500 MG tablet Take 500 mg by mouth daily as needed for mild pain. 05/30/21   [provider]  ondansetron (ZOFRAN) 4 MG tablet Take 1 tablet (4 mg total) by mouth every 8 (eight) hours as needed  for nausea or vomiting. 10/16/22   Leath-Warren, Sadie Haber, NP  oseltamivir (TAMIFLU) 75 MG capsule Take 1 capsule (75 mg total) by mouth every 12 (twelve) hours. 10/16/22   Leath-Warren, Sadie Haber, NP  promethazine-dextromethorphan (PROMETHAZINE-DM) 6.25-15 MG/5ML syrup Take 5 mLs by mouth 4 (four) times daily as needed for cough. 10/16/22   Leath-Warren, Sadie Haber, NP  venlafaxine  XR (EFFEXOR XR) 37.5 MG 24 hr capsule Take 1 capsule (37.5 mg total) by mouth daily for 14 days, THEN 1 capsule (37.5 mg total) every other day for 14 days. 06/18/21 07/16/21  Gwenlyn Fudge, FNP    Family History Family History  Problem Relation Age of Onset   Colon cancer Maternal Grandfather    Diabetes Maternal Grandfather    Heart disease Maternal Grandfather    Lung cancer Maternal Grandmother    Asthma Son    Colon polyps Neg Hx     Social History Social History   Tobacco Use   Smoking status: Former    Types: Cigarettes   Smokeless tobacco: Never  Vaping Use   Vaping Use: Never used  Substance Use Topics   Alcohol use: Not Currently    Comment: occasional. 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    Drug use: Yes    Types: Marijuana    Comment: Last use 05/16/21     Allergies   Patient has no known allergies.   Review of Systems Review of Systems Per HPI  Physical Exam Triage Vital Signs ED Triage Vitals  Enc Vitals Group     BP 05/29/23 1542 133/81     Pulse Rate 05/29/23 1542 76     Resp 05/29/23 1542 16     Temp 05/29/23 1542 98 F (36.7 C)     Temp Source 05/29/23 1542 Oral     SpO2 05/29/23 1542 97 %     Weight --      Height --      Head Circumference --      Peak Flow --      Pain Score 05/29/23 1544 7     Pain Loc --      Pain Edu? --      Excl. in GC? --    No data found.  Updated Vital Signs BP 133/81 (BP Location: Left Arm)   Pulse 76   Temp 98 F (36.7 C) (Oral)   Resp 16   SpO2 97%   Visual Acuity Right Eye Distance:   Left Eye Distance:   Bilateral Distance:    Right Eye Near:   Left Eye Near:    Bilateral Near:     Physical Exam Vitals and nursing note reviewed.  Constitutional:      General: He is not in acute distress.    Appearance: Normal appearance. He is not toxic-appearing.  Pulmonary:     Effort: Pulmonary effort is normal. No respiratory distress.  Musculoskeletal:     Comments:  Inspection: No swelling, obvious deformity, redness, or bruising to right upper extremity Palpation: Right upper extremity tender to palpation along the biceps; no obvious deformities palpated and no masses appreciated ROM: Difficult to assess secondary to pain Strength: 5/5 bilateral upper extremities Neurovascular: neurovascularly intact in left and right upper extremity   Skin:    General: Skin is warm and dry.     Capillary Refill: Capillary refill takes less than 2 seconds.     Coloration: Skin is not jaundiced or pale.  Findings: No erythema.  Neurological:     Mental Status: He is alert and oriented to person, place, and time.  Psychiatric:        Behavior: Behavior is cooperative.      UC Treatments / Results  Labs (all labs ordered are listed, but only abnormal results are displayed) Labs Reviewed - No data to display  EKG   Radiology No results found.  Procedures Procedures (including critical care time)  Medications Ordered in UC Medications - No data to display  Initial Impression / Assessment and Plan / UC Course  I have reviewed the triage vital signs and the nursing notes.  Pertinent labs & imaging results that were available during my care of the patient were reviewed by me and considered in my medical decision making (see chart for details).   Patient is well-appearing, normotensive, afebrile, not tachycardic, not tachypneic, oxygenating well on room air.    1. Strain of right biceps, initial encounter Suspect strain of right biceps Supportive care discussed with patient Continue naproxen sodium at home-patient has plenty of and does not need prescription today Follow-up with sports medicine with no improvement/worsening of symptoms despite treatment  The patient was given the opportunity to ask questions.  All questions answered to their satisfaction.  The patient is in agreement to this plan.    Final Clinical Impressions(s) / UC Diagnoses    Final diagnoses:  Strain of right biceps, initial encounter     Discharge Instructions      Recommend ice 15 minutes on, 45 minutes off every hour while awake for the next 48 hours.  Continue naproxen as needed for pain.  Follow-up with sports medicine with no improvement or worsening of symptoms despite treatment.    ED Prescriptions   None    PDMP not reviewed this encounter.   Valentino Nose, NP 05/29/23 1651

## 2023-06-09 DIAGNOSIS — R2232 Localized swelling, mass and lump, left upper limb: Secondary | ICD-10-CM | POA: Diagnosis not present

## 2023-06-09 DIAGNOSIS — T63461A Toxic effect of venom of wasps, accidental (unintentional), initial encounter: Secondary | ICD-10-CM | POA: Diagnosis not present

## 2023-06-26 ENCOUNTER — Ambulatory Visit (INDEPENDENT_AMBULATORY_CARE_PROVIDER_SITE_OTHER): Payer: BC Managed Care – PPO

## 2023-06-26 ENCOUNTER — Ambulatory Visit
Admission: EM | Admit: 2023-06-26 | Discharge: 2023-06-26 | Disposition: A | Discharge status: Home / Self Care | Payer: BC Managed Care – PPO | Attending: Family Medicine | Admitting: Family Medicine

## 2023-06-26 DIAGNOSIS — M25531 Pain in right wrist: Secondary | ICD-10-CM | POA: Diagnosis not present

## 2023-06-26 MED ORDER — TIZANIDINE HCL 4 MG PO CAPS: 4.0000 mg | ORAL_CAPSULE | Freq: Three times a day (TID) | ORAL | 0 refills | Status: AC | PRN

## 2023-06-26 MED ORDER — PREDNISONE 50 MG PO TABS: ORAL_TABLET | ORAL | 0 refills | Status: AC

## 2023-06-26 NOTE — ED Triage Notes (Signed)
Pt reports lifting furniture and his right arm gave out now his right wrist/hand is painful x 1 week.

## 2023-06-30 NOTE — ED Provider Notes (Signed)
[facility]    CSN: [id number] Arrival date & time: 06/26/23  1053      History   Chief Complaint No chief complaint on file.   HPI Ernest Cochran is a 32 y.o. male.   Patient presenting today with 1 week history of right wrist and hand pain after doing some heavy lifting about a week ago.  He denies redness, swelling, bruising, numbness, tingling, loss of range of motion.  Pain is worse with movement.  So far trying over-the-counter remedies with no relief.    Past Medical History:  Diagnosis Date   Asthma    Depression    Eosinophilic esophagitis 2020   GERD (gastroesophageal reflux disease)    History of kidney stones     Patient Active Problem List   Diagnosis Date Noted   Eosinophilic esophagitis    Hematuria 12/25/2019   Depression    Dyspepsia 07/15/2019   Dysphagia 07/15/2019    Past Surgical History:  Procedure Laterality Date   BIOPSY  01/07/2020   Procedure: BIOPSY;  Surgeon: West Bali, MD;  Location: [facility];  Service: Endoscopy;;  duodenum esophagus   ESOPHAGOGASTRODUODENOSCOPY (EGD) WITH PROPOFOL N/A 10/03/2019   Procedure: ESOPHAGOGASTRODUODENOSCOPY (EGD) WITH PROPOFOL;  Surgeon: West Bali, MD; LA grade B esophagitis s/p biopsy and dilation, mild gastritis s/p biopsy, and mild duodenitis.  Stomach biopsy with mild chronic inflammation negative for H. pylori.  Esophageal biopsy with inflamed squamous mucosa with increased eosinophils consistent with eosinophilic esophagitis.    ESOPHAGOGASTRODUODENOSCOPY (EGD) WITH PROPOFOL N/A 01/07/2020   Procedure: ESOPHAGOGASTRODUODENOSCOPY (EGD) WITH PROPOFOL;  Surgeon: West Bali, MD;  Esophageal mucosal changes secondary to eosinophilic esophagitis s/p biopsy, normal stomach, normal examined duodenum s/p biopsied.  Esophageal biopsies with increased intraepithelial eosinophils.  Duodenal bulb biopsy with peptic duodenitis, second part of duodenum with benign small bowel mucosa.     LUMBAR LAMINECTOMY/DECOMPRESSION MICRODISCECTOMY Right 05/19/2021   Procedure: Microdiscectomy -Lumbar Five-Sacral One Right;  Surgeon: Tia Alert, MD;  Location: [facility] OR;  Service: Neurosurgery;  Laterality: Right;  3C   SAVORY DILATION N/A 10/03/2019   Procedure: SAVORY DILATION;  Surgeon: West Bali, MD;  Location: [facility];  Service: Endoscopy;  Laterality: N/A;  15/16/17       Home Medications    Prior to Admission medications   Medication Sig Start Date End Date Taking? Authorizing Provider  predniSONE (DELTASONE) 50 MG tablet Take 1 tab daily x 3 days with breakfast 06/26/23  Yes Particia Nearing, PA-C  tiZANidine (ZANAFLEX) 4 MG capsule Take 1 capsule (4 mg total) by mouth 3 (three) times daily as needed for muscle spasms. Do not drink alcohol or drive while taking this medication.  May cause drowsiness. 06/26/23  Yes Particia Nearing, PA-C  escitalopram (LEXAPRO) 10 MG tablet Take 1 tablet (10 mg total) by mouth daily. 06/18/21   Gwenlyn Fudge, FNP  fluticasone (FLONASE) 50 MCG/ACT nasal spray Place 2 sprays into both nostrils daily. 10/16/22   Leath-Warren, Sadie Haber, NP  hydrOXYzine (ATARAX/VISTARIL) 25 MG tablet Take 1 tablet (25 mg total) by mouth 3 (three) times daily. 06/18/21   Gwenlyn Fudge, FNP  ibuprofen (ADVIL) 600 MG tablet Take 1 tablet (600 mg total) by mouth every 6 (six) hours as needed. Patient not taking: Reported on 06/18/2021 02/12/21   Jacalyn Lefevre, MD  naproxen (NAPROSYN) 500 MG tablet Take 500 mg by mouth daily as needed for mild pain. 05/30/21   [provider]  ondansetron (ZOFRAN) 4 MG tablet Take 1 tablet (4 mg total) by mouth every 8 (eight) hours as needed for nausea or vomiting. 10/16/22   Leath-Warren, Sadie Haber, NP  oseltamivir (TAMIFLU) 75 MG capsule Take 1 capsule (75 mg total) by mouth every 12 (twelve) hours. 10/16/22   Leath-Warren, Sadie Haber, NP  promethazine-dextromethorphan (PROMETHAZINE-DM) 6.25-15 MG/5ML  syrup Take 5 mLs by mouth 4 (four) times daily as needed for cough. 10/16/22   Leath-Warren, Sadie Haber, NP  venlafaxine XR (EFFEXOR XR) 37.5 MG 24 hr capsule Take 1 capsule (37.5 mg total) by mouth daily for 14 days, THEN 1 capsule (37.5 mg total) every other day for 14 days. 06/18/21 07/16/21  Gwenlyn Fudge, FNP    Family History Family History  Problem Relation Age of Onset   Colon cancer Maternal Grandfather    Diabetes Maternal Grandfather    Heart disease Maternal Grandfather    Lung cancer Maternal Grandmother    Asthma Son    Colon polyps Neg Hx     Social History Social History   Tobacco Use   Smoking status: Former    Types: Cigarettes   Smokeless tobacco: Never  Vaping Use   Vaping status: Never Used  Substance Use Topics   Alcohol use: Not Currently    Comment: occasional. 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    Drug use: Yes    Types: Marijuana    Comment: Last use 05/16/21     Allergies   Patient has no known allergies.   Review of Systems Review of Systems Per HPI  Physical Exam Triage Vital Signs ED Triage Vitals  Encounter Vitals Group     BP 06/26/23 1151 119/75     Systolic BP Percentile --      Diastolic BP Percentile --      Pulse Rate 06/26/23 1151 64     Resp 06/26/23 1151 18     Temp 06/26/23 1151 98.1 F (36.7 C)     Temp Source 06/26/23 1151 Oral     SpO2 06/26/23 1151 99 %     Weight --      Height --      Head Circumference --      Peak Flow --      Pain Score 06/26/23 1152 6     Pain Loc --      Pain Education --      Exclude from Growth Chart --    No data found.  Updated Vital Signs BP 119/75 (BP Location: Right Arm)   Pulse 64   Temp 98.1 F (36.7 C) (Oral)   Resp 18   SpO2 99%   Visual Acuity Right Eye Distance:   Left Eye Distance:   Bilateral Distance:    Right Eye Near:   Left Eye Near:    Bilateral Near:     Physical Exam Vitals and nursing note reviewed.  Constitutional:       Appearance: Normal appearance.  HENT:     Head: Atraumatic.  Eyes:     Extraocular Movements: Extraocular movements intact.     Conjunctiva/sclera: Conjunctivae normal.  Cardiovascular:     Rate and Rhythm: Normal rate and regular rhythm.  Pulmonary:     Effort: Pulmonary effort is normal.     Breath sounds: Normal breath sounds.  Musculoskeletal:        General: Tenderness present. No swelling or deformity. Normal range of motion.  Cervical back: Normal range of motion and neck supple.     Comments: Widespread tenderness to palpation through right hand and wrist and forearm musculature.  Range of motion intact and no deformities palpable  Skin:    General: Skin is warm and dry.     Findings: No bruising or erythema.  Neurological:     General: No focal deficit present.     Mental Status: He is oriented to person, place, and time.     Motor: No weakness.     Gait: Gait normal.     Comments: Right upper extremity neurovascularly intact  Psychiatric:        Mood and Affect: Mood normal.        Thought Content: Thought content normal.        Judgment: Judgment normal.      UC Treatments / Results  Labs (all labs ordered are listed, but only abnormal results are displayed) Labs Reviewed - No data to display  EKG   Radiology No results found.  Procedures Procedures (including critical care time)  Medications Ordered in UC Medications - No data to display  Initial Impression / Assessment and Plan / UC Course  I have reviewed the triage vital signs and the nursing notes.  Pertinent labs & imaging results that were available during my care of the patient were reviewed by me and considered in my medical decision making (see chart for details).     X-ray of the right wrist negative for acute bony abnormality.  Suspect soft tissue strain.  Treat with prednisone, Zanaflex, RICE protocol.  Return for worsening symptoms.  Final Clinical Impressions(s) / UC Diagnoses    Final diagnoses:  Right wrist pain   Discharge Instructions   None    ED Prescriptions     Medication Sig Dispense Auth. Provider   predniSONE (DELTASONE) 50 MG tablet Take 1 tab daily x 3 days with breakfast 3 tablet Particia Nearing, PA-C   tiZANidine (ZANAFLEX) 4 MG capsule Take 1 capsule (4 mg total) by mouth 3 (three) times daily as needed for muscle spasms. Do not drink alcohol or drive while taking this medication.  May cause drowsiness. 15 capsule Particia Nearing, [place]      PDMP not reviewed this encounter.   Particia Nearing, [place] 06/30/23 1444

## 2023-07-11 DIAGNOSIS — B349 Viral infection, unspecified: Secondary | ICD-10-CM | POA: Diagnosis not present

## 2023-07-12 ENCOUNTER — Encounter: Payer: Self-pay | Admitting: Emergency Medicine

## 2023-07-12 ENCOUNTER — Ambulatory Visit
Admission: EM | Admit: 2023-07-12 | Discharge: 2023-07-12 | Disposition: A | Discharge status: Home / Self Care | Payer: BC Managed Care – PPO | Attending: Nurse Practitioner | Admitting: Nurse Practitioner

## 2023-07-12 ENCOUNTER — Other Ambulatory Visit: Payer: Self-pay

## 2023-07-12 DIAGNOSIS — B349 Viral infection, unspecified: Secondary | ICD-10-CM | POA: Diagnosis not present

## 2023-07-12 MED ORDER — ONDANSETRON 4 MG PO TBDP: 4.0000 mg | ORAL_TABLET | Freq: Three times a day (TID) | ORAL | 0 refills | Status: DC | PRN

## 2023-07-12 NOTE — ED Provider Notes (Signed)
[facility]    CSN: [id number] Arrival date & time: 07/12/23  1303      History   Chief Complaint Chief Complaint  Patient presents with   Headache    HPI Ernest Cochran is a 32 y.o. male.   The history is provided by the patient.   The patient presents with a 3-day history of chills, low-grade fevers, headache, nausea, and diarrhea.  Patient denies sore throat, ear pain, ear drainage, nasal congestion, runny nose, cough, abdominal pain, vomiting, or urinary symptoms.  Patient reports that his wife is sick with the same or similar symptoms, [place]  He reports he has been taking over-the-counter pain medication for his headache.  Patient states he took a home COVID test yesterday which was negative.  Past Medical History:  Diagnosis Date   Asthma    Depression    Eosinophilic esophagitis 2020   GERD (gastroesophageal reflux disease)    History of kidney stones     Patient Active Problem List   Diagnosis Date Noted   Eosinophilic esophagitis    Hematuria 12/25/2019   Depression    Dyspepsia 07/15/2019   Dysphagia 07/15/2019    Past Surgical History:  Procedure Laterality Date   BIOPSY  01/07/2020   Procedure: BIOPSY;  Surgeon: West Bali, MD;  Location: [facility];  Service: Endoscopy;;  duodenum esophagus   ESOPHAGOGASTRODUODENOSCOPY (EGD) WITH PROPOFOL N/A 10/03/2019   Procedure: ESOPHAGOGASTRODUODENOSCOPY (EGD) WITH PROPOFOL;  Surgeon: West Bali, MD; LA grade B esophagitis s/p biopsy and dilation, mild gastritis s/p biopsy, and mild duodenitis.  Stomach biopsy with mild chronic inflammation negative for H. pylori.  Esophageal biopsy with inflamed squamous mucosa with increased eosinophils consistent with eosinophilic esophagitis.    ESOPHAGOGASTRODUODENOSCOPY (EGD) WITH PROPOFOL N/A 01/07/2020   Procedure: ESOPHAGOGASTRODUODENOSCOPY (EGD) WITH PROPOFOL;  Surgeon: West Bali, MD;  Esophageal mucosal  changes secondary to eosinophilic esophagitis s/p biopsy, normal stomach, normal examined duodenum s/p biopsied.  Esophageal biopsies with increased intraepithelial eosinophils.  Duodenal bulb biopsy with peptic duodenitis, second part of duodenum with benign small bowel mucosa.    LUMBAR LAMINECTOMY/DECOMPRESSION MICRODISCECTOMY Right 05/19/2021   Procedure: Microdiscectomy -Lumbar Five-Sacral One Right;  Surgeon: Tia Alert, MD;  Location: [facility] OR;  Service: Neurosurgery;  Laterality: Right;  3C   SAVORY DILATION N/A 10/03/2019   Procedure: SAVORY DILATION;  Surgeon: West Bali, MD;  Location: [facility];  Service: Endoscopy;  Laterality: N/A;  15/16/17       Home Medications    Prior to Admission medications   Medication Sig Start Date End Date Taking? Authorizing Provider  ondansetron (ZOFRAN-ODT) 4 MG disintegrating tablet Take 1 tablet (4 mg total) by mouth every 8 (eight) hours as needed. 07/12/23  Yes -Warren, Sadie Haber, NP  escitalopram (LEXAPRO) 10 MG tablet Take 1 tablet (10 mg total) by mouth daily. 06/18/21   Gwenlyn Fudge, FNP  fluticasone (FLONASE) 50 MCG/ACT nasal spray Place 2 sprays into both nostrils daily. 10/16/22   -Warren, Sadie Haber, NP  hydrOXYzine (ATARAX/VISTARIL) 25 MG tablet Take 1 tablet (25 mg total) by mouth 3 (three) times daily. 06/18/21   Gwenlyn Fudge, FNP  ibuprofen (ADVIL) 600 MG tablet Take 1 tablet (600 mg total) by mouth every 6 (six) hours as needed. Patient not taking: Reported on 06/18/2021 02/12/21   Jacalyn Lefevre, MD  naproxen (NAPROSYN) 500 MG tablet Take 500 mg by mouth daily as needed for mild  pain. 05/30/21   [provider]  ondansetron (ZOFRAN) 4 MG tablet Take 1 tablet (4 mg total) by mouth every 8 (eight) hours as needed for nausea or vomiting. 10/16/22   -Warren, Sadie Haber, NP  oseltamivir (TAMIFLU) 75 MG capsule Take 1 capsule (75 mg total) by mouth every 12 (twelve) hours. 10/16/22   -Warren,  Sadie Haber, NP  predniSONE (DELTASONE) 50 MG tablet Take 1 tab daily x 3 days with breakfast 06/26/23   Particia Nearing, PA-C  promethazine-dextromethorphan (PROMETHAZINE-DM) 6.25-15 MG/5ML syrup Take 5 mLs by mouth 4 (four) times daily as needed for cough. 10/16/22   -Warren, Sadie Haber, NP  tiZANidine (ZANAFLEX) 4 MG capsule Take 1 capsule (4 mg total) by mouth 3 (three) times daily as needed for muscle spasms. Do not drink alcohol or drive while taking this medication.  May cause drowsiness. 06/26/23   Particia Nearing, PA-C  venlafaxine XR (EFFEXOR XR) 37.5 MG 24 hr capsule Take 1 capsule (37.5 mg total) by mouth daily for 14 days, THEN 1 capsule (37.5 mg total) every other day for 14 days. 06/18/21 07/16/21  Gwenlyn Fudge, FNP    Family History Family History  Problem Relation Age of Onset   Colon cancer Maternal Grandfather    Diabetes Maternal Grandfather    Heart disease Maternal Grandfather    Lung cancer Maternal Grandmother    Asthma Son    Colon polyps Neg Hx     Social History Social History   Tobacco Use   Smoking status: Former    Types: Cigarettes   Smokeless tobacco: Never  Vaping Use   Vaping status: Never Used  Substance Use Topics   Alcohol use: Not Currently    Comment: occasional. 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    Drug use: Yes    Types: Marijuana    Comment: Last use 05/16/21     Allergies   Patient has no known allergies.   Review of Systems Review of Systems Per HPI  Physical Exam Triage Vital Signs ED Triage Vitals  Encounter Vitals Group     BP 07/12/23 1440 108/74     Systolic BP Percentile --      Diastolic BP Percentile --      Pulse Rate 07/12/23 1440 72     Resp 07/12/23 1440 20     Temp 07/12/23 1440 (!) 97.5 F (36.4 C)     Temp Source 07/12/23 1440 Oral     SpO2 07/12/23 1440 96 %     Weight --      Height --      Head Circumference --      Peak Flow --      Pain Score 07/12/23 1441  3     Pain Loc --      Pain Education --      Exclude from Growth Chart --    No data found.  Updated Vital Signs BP 108/74 (BP Location: Right Arm)   Pulse 72   Temp (!) 97.5 F (36.4 C) (Oral)   Resp 20   SpO2 96%   Visual Acuity Right Eye Distance:   Left Eye Distance:   Bilateral Distance:    Right Eye Near:   Left Eye Near:    Bilateral Near:     Physical Exam Vitals and nursing note reviewed.  Constitutional:      General: He is not in acute distress.    Appearance: He  is well-developed.  HENT:     Head: Normocephalic.     Right Ear: Tympanic membrane, ear canal and external ear normal.     Left Ear: Tympanic membrane, ear canal and external ear normal.     Nose: Nose normal.     Mouth/Throat:     Mouth: Mucous membranes are moist.  Eyes:     Extraocular Movements: Extraocular movements intact.     Pupils: Pupils are equal, round, and reactive to light.  Cardiovascular:     Rate and Rhythm: Normal rate and regular rhythm.     Pulses: Normal pulses.     Heart sounds: Normal heart sounds.  Pulmonary:     Effort: Pulmonary effort is normal. No respiratory distress.     Breath sounds: Normal breath sounds. No stridor. No wheezing, rhonchi or rales.  Abdominal:     General: Bowel sounds are normal.     Palpations: Abdomen is soft.     Tenderness: There is no abdominal tenderness.  Musculoskeletal:     Cervical back: Normal range of motion.  Lymphadenopathy:     Cervical: No cervical adenopathy.  Skin:    General: Skin is warm and dry.  Neurological:     General: No focal deficit present.     Mental Status: He is alert and oriented to person, place, and time.  Psychiatric:        Mood and Affect: Mood normal.        Behavior: Behavior normal.      UC Treatments / Results  Labs (all labs ordered are listed, but only abnormal results are displayed) Labs Reviewed - No data to display  EKG   Radiology No results found.  Procedures Procedures  (including critical care time)  Medications Ordered in UC Medications - No data to display  Initial Impression / Assessment and Plan / UC Course  I have reviewed the triage vital signs and the nursing notes.  Pertinent labs & imaging results that were available during my care of the patient were reviewed by me and considered in my medical decision making (see chart for details).  The patient is well-appearing, he is in no acute distress, vital signs are stable.  Symptoms appear to be consistent with a viral illness.  Home COVID test was negative.  Will treat patient's nausea with Zofran 4 mg ODT.  Supportive care recommendations were provided and discussed with the patient to include continuing over-the-counter analgesics for pain, a brat diet until nausea improves, and allowing for plenty of rest and increasing fluids.  Patient was advised that if symptoms have not improved over the next 5 to 7 days, recommend following up in this clinic or with his primary care physician for further evaluation.  Patient is in agreement with this plan of care and verbalizes understanding.  All questions were answered.  Patient stable for discharge.  Work note was provided.  Final Clinical Impressions(s) / UC Diagnoses   Final diagnoses:  Viral illness     Discharge Instructions      As discussed, symptoms appear to be consistent with a viral illness. Take medication as prescribed. Increase fluids and allow for plenty of rest.  Try to drink at least 8-10 8 ounce glasses of water while symptoms persist. Continue over-the-counter ibuprofen or Tylenol as needed for pain, fever, or general discomfort. Recommend a brat diet while nausea persist to include bananas, rice, applesauce, and toast. If you develop worsening nausea or have a high fever, with abdominal  pain, vomiting, diarrhea, or other concerns, please follow-up in the emergency department for further evaluation. Follow-up as needed.     ED  Prescriptions     Medication Sig Dispense Auth. Provider   ondansetron (ZOFRAN-ODT) 4 MG disintegrating tablet Take 1 tablet (4 mg total) by mouth every 8 (eight) hours as needed. 20 tablet -Warren, Sadie Haber, NP      PDMP not reviewed this encounter.   Abran Cantor, NP 07/12/23 1501

## 2023-07-12 NOTE — Discharge Instructions (Addendum)
As discussed, symptoms appear to be consistent with a viral illness. Take medication as prescribed. Increase fluids and allow for plenty of rest.  Try to drink at least 8-10 8 ounce glasses of water while symptoms persist. Continue over-the-counter ibuprofen or Tylenol as needed for pain, fever, or general discomfort. Recommend a brat diet while nausea persist to include bananas, rice, applesauce, and toast. If you develop worsening nausea or have a high fever, with abdominal pain, vomiting, diarrhea, or other concerns, please follow-up in the emergency department for further evaluation. Follow-up as needed.

## 2023-07-12 NOTE — ED Triage Notes (Addendum)
Pt reports headache, nausea, chills,low grade fevers, diarrhea since Monday. [place]. Home covid test negative yesterday.

## 2023-08-28 ENCOUNTER — Other Ambulatory Visit: Payer: Self-pay

## 2023-08-28 ENCOUNTER — Ambulatory Visit
Admission: EM | Admit: 2023-08-28 | Discharge: 2023-08-28 | Disposition: A | Discharge status: Home / Self Care | Payer: BC Managed Care – PPO | Attending: Nurse Practitioner | Admitting: Nurse Practitioner

## 2023-08-28 DIAGNOSIS — R112 Nausea with vomiting, unspecified: Secondary | ICD-10-CM | POA: Diagnosis not present

## 2023-08-28 DIAGNOSIS — R197 Diarrhea, unspecified: Secondary | ICD-10-CM

## 2023-08-28 MED ORDER — ONDANSETRON 4 MG PO TBDP: 4.0000 mg | ORAL_TABLET | Freq: Three times a day (TID) | ORAL | 0 refills | Status: AC | PRN

## 2023-08-28 NOTE — ED Provider Notes (Signed)
[facility]    CSN: [id number] Arrival date & time: 08/28/23  1725      History   Chief Complaint Chief Complaint  Patient presents with   Emesis    HPI Ernest Cochran is a 32 y.o. male.   The history is provided by the patient.   Patient presents for complaints of nausea, vomiting, and diarrhea that started this morning.  Patient reports 1 episode of vomiting, with 2 episodes of diarrhea.  Patient denies fever, chills, chest pain, abdominal pain, constipation, urinary symptoms, or low back pain.  Patient denies eating or drinking anything that may have caused symptoms, although he reports that he did eat at cookout.  He reports he has been taking Pepto-Bismol for his symptoms which have helped relieve his diarrhea.  Patient states that he has not had anything to drink since around 3 PM today.  States he has been able to drink water and keep that down without difficulty.  Past Medical History:  Diagnosis Date   Asthma    Depression    Eosinophilic esophagitis 2020   GERD (gastroesophageal reflux disease)    History of kidney stones     Patient Active Problem List   Diagnosis Date Noted   Eosinophilic esophagitis    Hematuria 12/25/2019   Depression    Dyspepsia 07/15/2019   Dysphagia 07/15/2019    Past Surgical History:  Procedure Laterality Date   BIOPSY  01/07/2020   Procedure: BIOPSY;  Surgeon: West Bali, MD;  Location: [facility];  Service: Endoscopy;;  duodenum esophagus   ESOPHAGOGASTRODUODENOSCOPY (EGD) WITH PROPOFOL N/A 10/03/2019   Procedure: ESOPHAGOGASTRODUODENOSCOPY (EGD) WITH PROPOFOL;  Surgeon: West Bali, MD; LA grade B esophagitis s/p biopsy and dilation, mild gastritis s/p biopsy, and mild duodenitis.  Stomach biopsy with mild chronic inflammation negative for H. pylori.  Esophageal biopsy with inflamed squamous mucosa with increased eosinophils consistent with eosinophilic esophagitis.    ESOPHAGOGASTRODUODENOSCOPY (EGD) WITH  PROPOFOL N/A 01/07/2020   Procedure: ESOPHAGOGASTRODUODENOSCOPY (EGD) WITH PROPOFOL;  Surgeon: West Bali, MD;  Esophageal mucosal changes secondary to eosinophilic esophagitis s/p biopsy, normal stomach, normal examined duodenum s/p biopsied.  Esophageal biopsies with increased intraepithelial eosinophils.  Duodenal bulb biopsy with peptic duodenitis, second part of duodenum with benign small bowel mucosa.    LUMBAR LAMINECTOMY/DECOMPRESSION MICRODISCECTOMY Right 05/19/2021   Procedure: Microdiscectomy -Lumbar Five-Sacral One Right;  Surgeon: Tia Alert, MD;  Location: [facility] OR;  Service: Neurosurgery;  Laterality: Right;  3C   SAVORY DILATION N/A 10/03/2019   Procedure: SAVORY DILATION;  Surgeon: West Bali, MD;  Location: [facility];  Service: Endoscopy;  Laterality: N/A;  15/16/17       Home Medications    Prior to Admission medications   Medication Sig Start Date End Date Taking? Authorizing Provider  ondansetron (ZOFRAN-ODT) 4 MG disintegrating tablet Take 1 tablet (4 mg total) by mouth every 8 (eight) hours as needed. 08/28/23  Yes Ayrton Mcvay-Warren, Sadie Haber, NP  escitalopram (LEXAPRO) 10 MG tablet Take 1 tablet (10 mg total) by mouth daily. 06/18/21   Gwenlyn Fudge, FNP  fluticasone (FLONASE) 50 MCG/ACT nasal spray Place 2 sprays into both nostrils daily. 10/16/22   Nadir Vasques-Warren, Sadie Haber, NP  hydrOXYzine (ATARAX/VISTARIL) 25 MG tablet Take 1 tablet (25 mg total) by mouth 3 (three) times daily. 06/18/21   Gwenlyn Fudge, FNP  ibuprofen (ADVIL) 600 MG tablet Take 1 tablet (600 mg total) by mouth every 6 (six) hours as needed.  Patient not taking: Reported on 06/18/2021 02/12/21   Jacalyn Lefevre, MD  naproxen (NAPROSYN) 500 MG tablet Take 500 mg by mouth daily as needed for mild pain. 05/30/21   [provider]  ondansetron (ZOFRAN) 4 MG tablet Take 1 tablet (4 mg total) by mouth every 8 (eight) hours as needed for nausea or vomiting. 10/16/22   Kristyana Notte-Warren, Sadie Haber, NP  oseltamivir (TAMIFLU) 75 MG capsule Take 1 capsule (75 mg total) by mouth every 12 (twelve) hours. 10/16/22   Laval Cafaro-Warren, Sadie Haber, NP  predniSONE (DELTASONE) 50 MG tablet Take 1 tab daily x 3 days with breakfast 06/26/23   Particia Nearing, PA-C  promethazine-dextromethorphan (PROMETHAZINE-DM) 6.25-15 MG/5ML syrup Take 5 mLs by mouth 4 (four) times daily as needed for cough. 10/16/22   Adama Ivins-Warren, Sadie Haber, NP  tiZANidine (ZANAFLEX) 4 MG capsule Take 1 capsule (4 mg total) by mouth 3 (three) times daily as needed for muscle spasms. Do not drink alcohol or drive while taking this medication.  May cause drowsiness. 06/26/23   Particia Nearing, PA-C  venlafaxine XR (EFFEXOR XR) 37.5 MG 24 hr capsule Take 1 capsule (37.5 mg total) by mouth daily for 14 days, THEN 1 capsule (37.5 mg total) every other day for 14 days. 06/18/21 07/16/21  Gwenlyn Fudge, FNP    Family History Family History  Problem Relation Age of Onset   Colon cancer Maternal Grandfather    Diabetes Maternal Grandfather    Heart disease Maternal Grandfather    Lung cancer Maternal Grandmother    Asthma Son    Colon polyps Neg Hx     Social History Social History   Tobacco Use   Smoking status: Former    Types: Cigarettes   Smokeless tobacco: Never  Vaping Use   Vaping status: Never Used  Substance Use Topics   Alcohol use: Not Currently    Comment: occasional. 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    Drug use: Yes    Types: Marijuana    Comment: Last use 05/16/21     Allergies   Patient has no known allergies.   Review of Systems Review of Systems Per HPI  Physical Exam Triage Vital Signs ED Triage Vitals  Encounter Vitals Group     BP 08/28/23 1915 114/75     Systolic BP Percentile --      Diastolic BP Percentile --      Pulse Rate 08/28/23 1915 62     Resp 08/28/23 1915 16     Temp 08/28/23 1915 98.4 F (36.9 C)     Temp Source 08/28/23 1915 Oral     SpO2  08/28/23 1915 96 %     Weight --      Height --      Head Circumference --      Peak Flow --      Pain Score 08/28/23 1913 0     Pain Loc --      Pain Education --      Exclude from Growth Chart --    No data found.  Updated Vital Signs BP 114/75 (BP Location: Right Arm)   Pulse 62   Temp 98.4 F (36.9 C) (Oral)   Resp 16   SpO2 96%   Visual Acuity Right Eye Distance:   Left Eye Distance:   Bilateral Distance:    Right Eye Near:   Left Eye Near:    Bilateral Near:  Physical Exam Vitals and nursing note reviewed.  Constitutional:      Appearance: Normal appearance.  HENT:     Head: Normocephalic.     Right Ear: Tympanic membrane, ear canal and external ear normal.     Left Ear: Tympanic membrane, ear canal and external ear normal.     Mouth/Throat:     Mouth: Mucous membranes are moist.  Eyes:     Extraocular Movements: Extraocular movements intact.     Conjunctiva/sclera: Conjunctivae normal.     Pupils: Pupils are equal, round, and reactive to light.  Cardiovascular:     Rate and Rhythm: Regular rhythm.     Pulses: Normal pulses.     Heart sounds: Normal heart sounds.  Pulmonary:     Effort: Pulmonary effort is normal. No respiratory distress.     Breath sounds: Normal breath sounds. No stridor. No wheezing, rhonchi or rales.  Abdominal:     General: Bowel sounds are normal.     Palpations: Abdomen is soft.     Tenderness: There is no abdominal tenderness.  Musculoskeletal:     Cervical back: Normal range of motion.  Lymphadenopathy:     Cervical: No cervical adenopathy.  Skin:    General: Skin is warm and dry.  Neurological:     General: No focal deficit present.     Mental Status: He is alert and oriented to person, place, and time.  Psychiatric:        Mood and Affect: Mood normal.        Behavior: Behavior normal.      UC Treatments / Results  Labs (all labs ordered are listed, but only abnormal results are displayed) Labs Reviewed - No  data to display  EKG   Radiology No results found.  Procedures Procedures (including critical care time)  Medications Ordered in UC Medications - No data to display  Initial Impression / Assessment and Plan / UC Course  I have reviewed the triage vital signs and the nursing notes.  Pertinent labs & imaging results that were available during my care of the patient were reviewed by me and considered in my medical decision making (see chart for details).  The patient is well-appearing, he is in no acute distress, vital signs are stable.  Suspect acute gastroenteritis, difficult to determine the cause of patient's symptoms.  He has been able to drink water and keep that down without difficulty.  P.o. challenge was also provided here and patient was able to tolerate water and crackers without difficulty.. Zofran 4 mg ODT prescribed for nausea and vomiting.  With regard to his diarrhea, patient was given information to help relieve symptoms.  Supportive care recommendations were provided and discussed with the patient to include increasing his fluid intake, recommending Pedialyte or Gatorolyte for rehydration, a bland diet, and allowing for plenty of rest.  Patient was advised of when follow-up in the emergency department will be necessary.  Patient is in agreement with this plan of care and verbalizes understanding.  All questions were answered.  Patient stable for discharge.  Note was provided for work.   Final Clinical Impressions(s) / UC Diagnoses   Final diagnoses:  Nausea vomiting and diarrhea     Discharge Instructions      Take medication as prescribed. Increase fluids and allow for plenty of rest. May take over-the-counter Tylenol or ibuprofen as needed for pain, fever, general discomfort. Recommend Pedialyte or Gatorolyte for rehydration. Recommend beginning with a clear liquid diet to  include chicken noodle soup, chicken broth, Sprite, ginger ale, popsicles, or Jell-O.  If  you are able to tolerate this, you can advance to a soft diet to include soft meats and vegetables.  If you are able to tolerate that, you can advance to a regular diet. If you experience worsening nausea, vomiting, or diarrhea with new symptoms of fever, chills, or abdominal pain, please go to the emergency department immediately. Follow-up as needed.     ED Prescriptions     Medication Sig Dispense Auth. Provider   ondansetron (ZOFRAN-ODT) 4 MG disintegrating tablet Take 1 tablet (4 mg total) by mouth every 8 (eight) hours as needed. 20 tablet Leana Springston-Warren, Sadie Haber, NP      PDMP not reviewed this encounter.   Abran Cantor, NP 08/28/23 1945

## 2023-08-28 NOTE — Discharge Instructions (Addendum)
Take medication as prescribed. Increase fluids and allow for plenty of rest. May take over-the-counter Tylenol or ibuprofen as needed for pain, fever, general discomfort. Recommend Pedialyte or Gatorolyte for rehydration. Recommend beginning with a clear liquid diet to include chicken noodle soup, chicken broth, Sprite, ginger ale, popsicles, or Jell-O.  If you are able to tolerate this, you can advance to a soft diet to include soft meats and vegetables.  If you are able to tolerate that, you can advance to a regular diet. If you experience worsening nausea, vomiting, or diarrhea with new symptoms of fever, chills, or abdominal pain, please go to the emergency department immediately. Follow-up as needed.

## 2023-08-28 NOTE — ED Triage Notes (Signed)
Nausea, vomiting and diarrhea that started today. Took Pepto-bismol with relief of diarrhea.

## 2023-09-04 DIAGNOSIS — R059 Cough, unspecified: Secondary | ICD-10-CM | POA: Diagnosis not present

## 2023-09-04 DIAGNOSIS — J029 Acute pharyngitis, unspecified: Secondary | ICD-10-CM | POA: Diagnosis not present

## 2024-01-02 ENCOUNTER — Other Ambulatory Visit: Payer: Self-pay

## 2024-01-02 ENCOUNTER — Emergency Department (HOSPITAL_COMMUNITY)
Admission: EM | Admit: 2024-01-02 | Discharge: 2024-01-02 | Disposition: A | Discharge status: Home / Self Care | Payer: Self-pay | Attending: Emergency Medicine | Admitting: Emergency Medicine

## 2024-01-02 ENCOUNTER — Encounter (HOSPITAL_COMMUNITY): Payer: Self-pay | Admitting: Emergency Medicine

## 2024-01-02 ENCOUNTER — Emergency Department (HOSPITAL_COMMUNITY): Payer: Self-pay

## 2024-01-02 DIAGNOSIS — R1032 Left lower quadrant pain: Secondary | ICD-10-CM | POA: Insufficient documentation

## 2024-01-02 LAB — COMPREHENSIVE METABOLIC PANEL
ALT: 30 U/L (ref 0–44)
AST: 26 U/L (ref 15–41)
Albumin: 4 g/dL (ref 3.5–5.0)
Alkaline Phosphatase: 76 U/L (ref 38–126)
Anion gap: 9 (ref 5–15)
BUN: 12 mg/dL (ref 6–20)
CO2: 24 mmol/L (ref 22–32)
Calcium: 9.2 mg/dL (ref 8.9–10.3)
Chloride: 105 mmol/L (ref 98–111)
Creatinine, Ser: 0.84 mg/dL (ref 0.61–1.24)
GFR, Estimated: >60 mL/min (ref >60)
Glucose, Bld: 101 mg/dL — ABNORMAL HIGH (ref 70–99)
Potassium: 3.6 mmol/L (ref 3.5–5.1)
Sodium: 138 mmol/L (ref 135–145)
Total Bilirubin: 0.6 mg/dL (ref 0.0–1.2)
Total Protein: 7.2 g/dL (ref 6.5–8.1)

## 2024-01-02 LAB — URINALYSIS, ROUTINE W REFLEX MICROSCOPIC
Bacteria, UA: NONE SEEN
Bilirubin Urine: NEGATIVE
Glucose, UA: 150 mg/dL — AB
Ketones, ur: NEGATIVE mg/dL
Leukocytes,Ua: NEGATIVE
Nitrite: NEGATIVE
Protein, ur: NEGATIVE mg/dL
Specific Gravity, Urine: 1.026 (ref 1.005–1.030)
pH: 5 (ref 5.0–8.0)

## 2024-01-02 LAB — CBC
HCT: 47.1 % (ref 39.0–52.0)
Hemoglobin: 15.8 g/dL (ref 13.0–17.0)
MCH: 29.3 pg (ref 26.0–34.0)
MCHC: 33.5 g/dL (ref 30.0–36.0)
MCV: 87.2 fL (ref 80.0–100.0)
Platelets: 331 10*3/uL (ref 150–400)
RBC: 5.4 MIL/uL (ref 4.22–5.81)
RDW: 11.7 % (ref 11.5–15.5)
WBC: 7.5 10*3/uL (ref 4.0–10.5)
nRBC: 0 % (ref 0.0–0.2)

## 2024-01-02 LAB — LIPASE, BLOOD: Lipase: 34 U/L (ref 11–51)

## 2024-01-02 MED ORDER — ONDANSETRON 4 MG PO TBDP: 4.0000 mg | ORAL_TABLET | Freq: Three times a day (TID) | ORAL | 0 refills | Status: DC | PRN

## 2024-01-02 MED ORDER — ONDANSETRON HCL 4 MG/2ML IJ SOLN
4.0000 mg | Freq: Once | INTRAMUSCULAR | Status: AC
  Administered 2024-01-02: 4 mg via INTRAVENOUS
  Filled 2024-01-02: qty 2

## 2024-01-02 MED ORDER — ONDANSETRON 4 MG PO TBDP: 4.0000 mg | ORAL_TABLET | Freq: Three times a day (TID) | ORAL | 0 refills | Status: AC | PRN

## 2024-01-02 MED ORDER — IOHEXOL 300 MG/ML  SOLN
100.0000 mL | Freq: Once | INTRAMUSCULAR | Status: AC | PRN
  Administered 2024-01-02: 100 mL via INTRAVENOUS

## 2024-01-02 NOTE — Discharge Instructions (Signed)
Please refer to the attached instructions

## 2024-01-02 NOTE — ED Notes (Signed)
Patient transported to CT

## 2024-01-02 NOTE — ED Provider Notes (Signed)
Patient received in sign out from K. Sophia, PA awaiting CT study in the evaluation of LLQ abdominal pain. Recently seen for Gi virus.   Results for orders placed or performed during the hospital encounter of 01/02/24  Urinalysis, Routine w reflex microscopic -Urine, Clean Catch   Collection Time: 01/02/24  2:13 PM  Result Value Ref Range   Color, Urine YELLOW YELLOW   APPearance CLEAR CLEAR   Specific Gravity, Urine 1.026 1.005 - 1.030   pH 5.0 5.0 - 8.0   Glucose, UA 150 (A) NEGATIVE mg/dL   Hgb urine dipstick MODERATE (A) NEGATIVE   Bilirubin Urine NEGATIVE NEGATIVE   Ketones, ur NEGATIVE NEGATIVE mg/dL   Protein, ur NEGATIVE NEGATIVE mg/dL   Nitrite NEGATIVE NEGATIVE   Leukocytes,Ua NEGATIVE NEGATIVE   RBC / HPF 11-20 0 - 5 RBC/hpf   WBC, UA 0-5 0 - 5 WBC/hpf   Bacteria, UA NONE SEEN NONE SEEN   Squamous Epithelial / HPF 0-5 0 - 5 /HPF   Mucus PRESENT   Lipase, blood   Collection Time: 01/02/24  2:15 PM  Result Value Ref Range   Lipase 34 11 - 51 U/L  Comprehensive metabolic panel   Collection Time: 01/02/24  2:15 PM  Result Value Ref Range   Sodium 138 135 - 145 mmol/L   Potassium 3.6 3.5 - 5.1 mmol/L   Chloride 105 98 - 111 mmol/L   CO2 24 22 - 32 mmol/L   Glucose, Bld 101 (H) 70 - 99 mg/dL   BUN 12 6 - 20 mg/dL   Creatinine, Ser 1.61 0.61 - 1.24 mg/dL   Calcium 9.2 8.9 - 09.6 mg/dL   Total Protein 7.2 6.5 - 8.1 g/dL   Albumin 4.0 3.5 - 5.0 g/dL   AST 26 15 - 41 U/L   ALT 30 0 - 44 U/L   Alkaline Phosphatase 76 38 - 126 U/L   Total Bilirubin 0.6 0.0 - 1.2 mg/dL   GFR, Estimated >04 >54 mL/min   Anion gap 9 5 - 15  CBC   Collection Time: 01/02/24  2:15 PM  Result Value Ref Range   WBC 7.5 4.0 - 10.5 K/uL   RBC 5.40 4.22 - 5.81 MIL/uL   Hemoglobin 15.8 13.0 - 17.0 g/dL   HCT 09.8 11.9 - 14.7 %   MCV 87.2 80.0 - 100.0 fL   MCH 29.3 26.0 - 34.0 pg   MCHC 33.5 30.0 - 36.0 g/dL   RDW 82.9 56.2 - 13.0 %   Platelets 331 150 - 400 K/uL   nRBC 0.0 0.0 - 0.2 %    CT ABDOMEN PELVIS W CONTRAST Result Date: 01/02/2024 CLINICAL DATA:  Abdominal pain, acute, nonlocalized Pt c/o of n/v x1 week. Pt has a stabbing pain to LLQ also. [facility] at [facility] and told to come in for a CT. EXAM: CT ABDOMEN AND PELVIS WITH CONTRAST TECHNIQUE: Multidetector CT imaging of the abdomen and pelvis was performed using the standard protocol following bolus administration of intravenous contrast. RADIATION DOSE REDUCTION: This exam was performed according to the departmental dose-optimization program which includes automated exposure control, adjustment of the mA and/or kV according to patient size and/or use of iterative reconstruction technique. CONTRAST:  OMNIPAQUE IOHEXOL 300 MG/ML  SOLN COMPARISON:  CT renal 12/30/2019 FINDINGS: Lower chest: No acute abnormality. Hepatobiliary: No focal liver abnormality. No gallstones, gallbladder wall thickening, or pericholecystic fluid. No biliary dilatation. Pancreas: No focal lesion. Normal pancreatic contour. No surrounding inflammatory changes. No  main pancreatic ductal dilatation. Spleen: Normal in size without focal abnormality. Adrenals/Urinary Tract: No adrenal nodule bilaterally. Bilateral kidneys enhance symmetrically. No hydronephrosis. No hydroureter. The urinary bladder is unremarkable. Stomach/Bowel: Stomach is within normal limits. No evidence of bowel wall thickening or dilatation. Appendix appears normal. Vascular/Lymphatic: No abdominal aorta or iliac aneurysm. No abdominal, pelvic, or inguinal lymphadenopathy. Reproductive: Prostate is unremarkable. Other: No intraperitoneal free fluid. No intraperitoneal free gas. No organized fluid collection. Musculoskeletal: No abdominal wall hernia or abnormality. No suspicious lytic or blastic osseous lesions. No acute displaced fracture. IMPRESSION: No acute intra-abdominal or intrapelvic abnormality. Electronically Signed   By: Tish Frederickson M.D.   On: 01/02/2024 21:04    Results  reviewed and shared with patient.   Patient is nontoxic, nonseptic appearing, in no current distress.  Patient's pain and other symptoms adequately managed in emergency department.  Fluid bolus given.  Labs, imaging and vitals reviewed.  Patient does not meet the SIRS or Sepsis criteria.  On repeat exam patient does not have a surgical abdomen and there are no peritoneal signs.  No indication of appendicitis, bowel obstruction, bowel perforation, cholecystitis, diverticulitis.  Patient discharged home with symptomatic treatment and given strict instructions for follow-up with their primary care physician.  I have also discussed reasons to return immediately to the ER.  Patient expresses understanding and agrees with plan.     Felicie Morn, NP 01/02/24 2133    Gloris Manchester, MD 01/03/24 [phone]

## 2024-01-02 NOTE — ED Triage Notes (Signed)
Pt c/o of n/v x1 week. Pt has a stabbing pain to LLQ also. Was seen at [facility] and told to come in for a CT.

## 2024-01-02 NOTE — ED Provider Notes (Signed)
[place] EMERGENCY DEPARTMENT AT [facility] Provider Note   CSN: [id number] Arrival date & time: 01/02/24  1243     History  Chief Complaint  Patient presents with   Abdominal Pain    Ernest Cochran is a 33 y.o. male.  Patient complains of left lower quadrant abdominal pain.  Patient reports symptoms began a week ago.  Patient reports that he has had nausea and experiencing some diarrhea.  Patient was seen at urgent care today and sent to the emergency department for evaluation.  The urgent care provider was concerned that patient needed a CT scan.  Patient reports that the pain is gradually been getting worse.  Patient denies any flank pain he denies any hematuria.  Patient denies any fever or chills  The history is provided by the patient and the spouse. No language interpreter was used.  Abdominal Pain Pain location:  LLQ Pain quality: aching and sharp   Relieved by:  Nothing Worsened by:  Nothing Ineffective treatments:  None tried      Home Medications Prior to Admission medications   Medication Sig Start Date End Date Taking? Authorizing Provider  escitalopram (LEXAPRO) 10 MG tablet Take 1 tablet (10 mg total) by mouth daily. 06/18/21   Gwenlyn Fudge, FNP  fluticasone (FLONASE) 50 MCG/ACT nasal spray Place 2 sprays into both nostrils daily. 10/16/22   Leath-Warren, Sadie Haber, NP  hydrOXYzine (ATARAX/VISTARIL) 25 MG tablet Take 1 tablet (25 mg total) by mouth 3 (three) times daily. 06/18/21   Gwenlyn Fudge, FNP  ibuprofen (ADVIL) 600 MG tablet Take 1 tablet (600 mg total) by mouth every 6 (six) hours as needed. Patient not taking: Reported on 06/18/2021 02/12/21   Jacalyn Lefevre, MD  naproxen (NAPROSYN) 500 MG tablet Take 500 mg by mouth daily as needed for mild pain. 05/30/21   [provider]  ondansetron (ZOFRAN) 4 MG tablet Take 1 tablet (4 mg total) by mouth every 8 (eight) hours as needed for nausea or vomiting. 10/16/22   Leath-Warren,  Sadie Haber, NP  ondansetron (ZOFRAN-ODT) 4 MG disintegrating tablet Take 1 tablet (4 mg total) by mouth every 8 (eight) hours as needed. 08/28/23   Leath-Warren, Sadie Haber, NP  oseltamivir (TAMIFLU) 75 MG capsule Take 1 capsule (75 mg total) by mouth every 12 (twelve) hours. 10/16/22   Leath-Warren, Sadie Haber, NP  predniSONE (DELTASONE) 50 MG tablet Take 1 tab daily x 3 days with breakfast 06/26/23   Particia Nearing, PA-C  promethazine-dextromethorphan (PROMETHAZINE-DM) 6.25-15 MG/5ML syrup Take 5 mLs by mouth 4 (four) times daily as needed for cough. 10/16/22   Leath-Warren, Sadie Haber, NP  tiZANidine (ZANAFLEX) 4 MG capsule Take 1 capsule (4 mg total) by mouth 3 (three) times daily as needed for muscle spasms. Do not drink alcohol or drive while taking this medication.  May cause drowsiness. 06/26/23   Particia Nearing, PA-C  venlafaxine XR (EFFEXOR XR) 37.5 MG 24 hr capsule Take 1 capsule (37.5 mg total) by mouth daily for 14 days, THEN 1 capsule (37.5 mg total) every other day for 14 days. 06/18/21 07/16/21  Gwenlyn Fudge, FNP      Allergies    Patient has no known allergies.    Review of Systems   Review of Systems  Gastrointestinal:  Positive for abdominal pain.  All other systems reviewed and are negative.   Physical Exam Updated Vital Signs BP (!) 140/76   Pulse 74   Temp 98.4 F (36.9  C) (Oral)   Resp 18   Ht 5\' 10"  (1.778 m)   Wt 108 kg   SpO2 99%   BMI 34.15 kg/m  Physical Exam Vitals and nursing note reviewed.  Constitutional:      General: He is not in acute distress.    Appearance: He is well-developed.  HENT:     Head: Normocephalic and atraumatic.  Eyes:     Conjunctiva/sclera: Conjunctivae normal.  Cardiovascular:     Rate and Rhythm: Normal rate and regular rhythm.     Heart sounds: No murmur heard. Pulmonary:     Effort: Pulmonary effort is normal. No respiratory distress.     Breath sounds: Normal breath sounds.  Abdominal:     General:  Abdomen is flat.     Palpations: Abdomen is soft.     Tenderness: There is abdominal tenderness in the left lower quadrant.  Musculoskeletal:        General: No swelling.     Cervical back: Neck supple.  Skin:    General: Skin is warm and dry.     Capillary Refill: Capillary refill takes less than 2 seconds.  Neurological:     General: No focal deficit present.     Mental Status: He is alert.  Psychiatric:        Mood and Affect: Mood normal.     ED Results / Procedures / Treatments   Labs (all labs ordered are listed, but only abnormal results are displayed) Labs Reviewed  COMPREHENSIVE METABOLIC PANEL - Abnormal; Notable for the following components:      Result Value   Glucose, Bld 101 (*)    All other components within normal limits  URINALYSIS, ROUTINE W REFLEX MICROSCOPIC - Abnormal; Notable for the following components:   Glucose, UA 150 (*)    Hgb urine dipstick MODERATE (*)    All other components within normal limits  LIPASE, BLOOD  CBC    EKG None  Radiology No results found.  Procedures Procedures    Medications Ordered in ED Medications - No data to display  ED Course/ Medical Decision Making/ A&P                                 Medical Decision Making Patient complains of pain in his left lower quadrant.  Symptoms began a week ago and have progressively gotten worse  Amount and/or Complexity of Data Reviewed Labs: ordered. Decision-making details documented in ED Course.    Details: Labs ordered reviewed and interpreted.  Patient has normal white blood cell count Radiology: ordered and independent interpretation performed. Decision-making details documented in ED Course.  Risk Risk Details: Patient's care turned over to Felicie Morn, NP at 7 PM CT pending.           Final Clinical Impression(s) / ED Diagnoses Final diagnoses:  Abdominal pain, left lower quadrant    Rx / DC Orders ED Discharge Orders     None          Osie Cheeks 01/02/24 1844    Gloris Manchester, MD 01/03/24 [phone]

## 2024-01-10 ENCOUNTER — Telehealth: Payer: Self-pay | Admitting: *Deleted

## 2024-01-10 NOTE — Progress Notes (Signed)
 Transition Care Management Follow-up Telephone Call Date of discharge and from where: [facility]  01/02/2024 How have you been since you were released from the hospital? Patient at work declined participation

## 2024-06-18 ENCOUNTER — Emergency Department (HOSPITAL_COMMUNITY)
Admission: EM | Admit: 2024-06-18 | Discharge: 2024-06-18 | Disposition: A | Discharge status: Home / Self Care | Payer: Self-pay | Source: Ambulatory Visit | Attending: Emergency Medicine | Admitting: Emergency Medicine

## 2024-06-18 ENCOUNTER — Emergency Department (HOSPITAL_COMMUNITY): Payer: Self-pay

## 2024-06-18 ENCOUNTER — Other Ambulatory Visit: Payer: Self-pay

## 2024-06-18 ENCOUNTER — Encounter (HOSPITAL_COMMUNITY): Payer: Self-pay | Admitting: Emergency Medicine

## 2024-06-18 DIAGNOSIS — R1030 Lower abdominal pain, unspecified: Secondary | ICD-10-CM

## 2024-06-18 DIAGNOSIS — R1032 Left lower quadrant pain: Secondary | ICD-10-CM | POA: Insufficient documentation

## 2024-06-18 LAB — GASTROINTESTINAL PANEL BY PCR, STOOL (REPLACES STOOL CULTURE)

## 2024-06-18 LAB — URINALYSIS, ROUTINE W REFLEX MICROSCOPIC
Bacteria, UA: NONE SEEN
Bilirubin Urine: NEGATIVE
Glucose, UA: NEGATIVE mg/dL
Ketones, ur: NEGATIVE mg/dL
Leukocytes,Ua: NEGATIVE
Nitrite: NEGATIVE
Protein, ur: NEGATIVE mg/dL
Specific Gravity, Urine: >1.046 — ABNORMAL HIGH (ref 1.005–1.030)
pH: 6 (ref 5.0–8.0)

## 2024-06-18 LAB — CBC
HCT: 46.3 % (ref 39.0–52.0)
Hemoglobin: 16.2 g/dL (ref 13.0–17.0)
MCH: 30.6 pg (ref 26.0–34.0)
MCHC: 35 g/dL (ref 30.0–36.0)
MCV: 87.5 fL (ref 80.0–100.0)
Platelets: 268 K/uL (ref 150–400)
RBC: 5.29 MIL/uL (ref 4.22–5.81)
RDW: 11.7 % (ref 11.5–15.5)
WBC: 8.4 K/uL (ref 4.0–10.5)
nRBC: 0 % (ref 0.0–0.2)

## 2024-06-18 LAB — COMPREHENSIVE METABOLIC PANEL WITH GFR
ALT: 24 U/L (ref 0–44)
AST: 21 U/L (ref 15–41)
Albumin: 3.7 g/dL (ref 3.5–5.0)
Alkaline Phosphatase: 90 U/L (ref 38–126)
Anion gap: 11 (ref 5–15)
BUN: 12 mg/dL (ref 6–20)
CO2: 21 mmol/L — ABNORMAL LOW (ref 22–32)
Calcium: 9 mg/dL (ref 8.9–10.3)
Chloride: 107 mmol/L (ref 98–111)
Creatinine, Ser: 0.91 mg/dL (ref 0.61–1.24)
GFR, Estimated: >60 mL/min (ref >60)
Glucose, Bld: 108 mg/dL — ABNORMAL HIGH (ref 70–99)
Potassium: 3.7 mmol/L (ref 3.5–5.1)
Sodium: 139 mmol/L (ref 135–145)
Total Bilirubin: 0.3 mg/dL (ref 0.0–1.2)
Total Protein: 6.8 g/dL (ref 6.5–8.1)

## 2024-06-18 LAB — LIPASE, BLOOD: Lipase: 36 U/L (ref 11–51)

## 2024-06-18 MED ORDER — LACTATED RINGERS IV BOLUS
1000.0000 mL | Freq: Once | INTRAVENOUS | Status: AC
  Administered 2024-06-18: 1000 mL via INTRAVENOUS

## 2024-06-18 MED ORDER — ONDANSETRON HCL 4 MG/2ML IJ SOLN: 4.0000 mg | Freq: Once | INTRAMUSCULAR | Status: DC | PRN

## 2024-06-18 MED ORDER — IOHEXOL 300 MG/ML  SOLN
100.0000 mL | Freq: Once | INTRAMUSCULAR | Status: AC | PRN
  Administered 2024-06-18: 100 mL via INTRAVENOUS

## 2024-06-18 MED ORDER — ACETAMINOPHEN 325 MG PO TABS
650.0000 mg | ORAL_TABLET | Freq: Once | ORAL | Status: AC
  Administered 2024-06-18: 650 mg via ORAL
  Filled 2024-06-18: qty 2

## 2024-06-18 NOTE — ED Triage Notes (Signed)
 Pt arrived via POV, from home. Ambulatory in lobby. Reports abdominal pain x3 weeks. Pain localized to LLQ. Had been taking azithromycin and nausea meds. Pain has persisted and patient was advised by urgent care yesterday that he should seek further evaluation at ER.

## 2024-06-18 NOTE — ED Provider Notes (Signed)
 [place] EMERGENCY DEPARTMENT AT [facility] Provider Note   CSN: [id number] Arrival date & time: 06/18/24  9262     Patient presents with: Abdominal Pain   Ernest Cochran is a 33 y.o. male.   HPI 33 year old male presents with left lower abdominal pain and diarrhea for about 3 weeks.  Patient states that he has had about 3 episodes of diarrhea per day that has some mucus in it.  No blood.  Patient states that he has been having pretty much constant left lower quadrant abdominal pain but it will get worse at times.  Mostly gets worse when he is about to have a bowel movement.  No fevers.  Has had some nausea but got some Zofran  from urgent care last week and that is improved.  He was given 3 days of azithromycin last week and completed the course but has not noticed any change in his symptoms.  He states he did go camping about 4 weeks ago.  He otherwise has not traveled out of the country.  Patient is noted to have a rash in his left lower abdomen.  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  This rash started about a week or so ago.  Prior to Admission medications   Medication Sig Start Date End Date Taking? Authorizing Provider  escitalopram  (LEXAPRO ) 10 MG tablet Take 1 tablet (10 mg total) by mouth daily. 06/18/21   Merlynn Niki FALCON, FNP  fluticasone  (FLONASE ) 50 MCG/ACT nasal spray Place 2 sprays into both nostrils daily. 10/16/22   Leath-Warren, Etta PARAS, NP  hydrOXYzine  (ATARAX /VISTARIL ) 25 MG tablet Take 1 tablet (25 mg total) by mouth 3 (three) times daily. 06/18/21   Merlynn Niki FALCON, FNP  ibuprofen  (ADVIL ) 600 MG tablet Take 1 tablet (600 mg total) by mouth every 6 (six) hours as needed. Patient not taking: Reported on 06/18/2021 02/12/21   Dean Clarity, MD  naproxen  (NAPROSYN ) 500 MG tablet Take 500 mg by mouth daily as needed for mild pain. 05/30/21   [provider]   ondansetron  (ZOFRAN ) 4 MG tablet Take 1 tablet (4 mg total) by mouth every 8 (eight) hours as needed for nausea or vomiting. 10/16/22   Leath-Warren, Etta PARAS, NP  ondansetron  (ZOFRAN -ODT) 4 MG disintegrating tablet Take 1 tablet (4 mg total) by mouth every 8 (eight) hours as needed. 08/28/23   Leath-Warren, Etta PARAS, NP  ondansetron  (ZOFRAN -ODT) 4 MG disintegrating tablet Take 1 tablet (4 mg total) by mouth every 8 (eight) hours as needed for nausea. 01/02/24   Claudene Lenis, NP  oseltamivir  (TAMIFLU ) 75 MG capsule Take 1 capsule (75 mg total) by mouth every 12 (twelve) hours. 10/16/22   Leath-Warren, Etta PARAS, NP  predniSONE  (DELTASONE ) 50 MG tablet Take 1 tab daily x 3 days with breakfast 06/26/23   Stuart Vernell Norris, PA-C  promethazine -dextromethorphan (PROMETHAZINE -DM) 6.25-15 MG/5ML syrup Take 5 mLs by mouth 4 (four) times daily as needed for cough. 10/16/22   Leath-Warren, Etta PARAS, NP  tiZANidine  (ZANAFLEX ) 4 MG capsule Take 1 capsule (4 mg total) by mouth 3 (three) times daily as needed for muscle spasms. Do not drink alcohol or drive while taking this medication.  May cause drowsiness. 06/26/23   Stuart Vernell Norris, PA-C  venlafaxine  XR (EFFEXOR  XR) 37.5 MG 24 hr capsule Take 1 capsule (37.5 mg total) by mouth daily for 14 days, THEN 1  capsule (37.5 mg total) every other day for 14 days. 06/18/21 07/16/21  Merlynn Niki FALCON, FNP    Allergies: Patient has no known allergies.    Review of Systems  Constitutional:  Negative for fever.  Gastrointestinal:  Positive for abdominal pain and diarrhea. Negative for blood in stool and vomiting.    Updated Vital Signs BP 119/75 (BP Location: Right Arm)   Pulse 70   Temp 98.3 F (36.8 C) (Oral)   Resp 18   Ht 5' 10 (1.778 m)   Wt 108 kg   SpO2 99%   BMI 34.16 kg/m   Physical Exam Vitals and nursing note reviewed.  Constitutional:      Appearance: He is well-developed.  HENT:     Head: Normocephalic and atraumatic.   Cardiovascular:     Rate and Rhythm: Normal rate and regular rhythm.     Heart sounds: Normal heart sounds.  Pulmonary:     Effort: Pulmonary effort is normal.     Breath sounds: Normal breath sounds.  Abdominal:     Palpations: Abdomen is soft.     Tenderness: There is abdominal tenderness (mild) in the left lower quadrant.   Skin:    General: Skin is warm and dry.  Neurological:     Mental Status: He is alert.     (all labs ordered are listed, but only abnormal results are displayed) Labs Reviewed  COMPREHENSIVE METABOLIC PANEL WITH GFR - Abnormal; Notable for the following components:      Result Value   CO2 21 (*)    Glucose, Bld 108 (*)    All other components within normal limits  URINALYSIS, ROUTINE W REFLEX MICROSCOPIC - Abnormal; Notable for the following components:   Color, Urine STRAW (*)    Specific Gravity, Urine >1.046 (*)    Hgb urine dipstick SMALL (*)    All other components within normal limits  GASTROINTESTINAL PANEL BY PCR, STOOL (REPLACES STOOL CULTURE)  LIPASE, BLOOD  CBC    EKG: EKG Interpretation Date/Time:  Tuesday June 18 2024 07:55:45 EDT Ventricular Rate:  70 PR Interval:  149 QRS Duration:  97 QT Interval:  422 QTC Calculation: 456 R Axis:   75  Text Interpretation: Sinus rhythm nonspecific T waves similar to July 2022 Confirmed by Freddi Hamilton [phone]) on 06/18/2024 11:24:13 AM  Radiology: CT ABDOMEN PELVIS W CONTRAST Result Date: 06/18/2024 CLINICAL DATA:  33 year old male with 3 weeks of abdominal pain. Left lower quadrant pain. Nausea. EXAM: CT ABDOMEN AND PELVIS WITH CONTRAST TECHNIQUE: Multidetector CT imaging of the abdomen and pelvis was performed using the standard protocol following bolus administration of intravenous contrast. RADIATION DOSE REDUCTION: This exam was performed according to the departmental dose-optimization program which includes automated exposure control, adjustment of the mA and/or kV according to patient  size and/or use of iterative reconstruction technique. CONTRAST:  OMNIPAQUE  IOHEXOL  300 MG/ML  SOLN COMPARISON:  CT Abdomen and Pelvis 01/02/2024. FINDINGS: Lower chest: Negative, mildly lower lung volumes. No pericardial or pleural effusion. Hepatobiliary: Negative liver and gallbladder. Pancreas: Negative. Spleen: Negative. Adrenals/Urinary Tract: Adrenal glands and kidneys appear stable and negative, aside from punctate left renal calculus visible on coronal image 72. Diminutive and negative ureters. Stable and unremarkable bladder. Stomach/Bowel: Negative large bowel. Normal appendix on series 2, image 53 tracking to the midline. No distal colon diverticulosis or convincing inflammation. Small bowel also appears negative. Decompressed stomach. Unchanged redundant proximal duodenum and ligament of Treitz normally located. However, the proximal loop of  small bowel is in the right abdomen. But most of the jejunum is in the left upper quadrant. This appears unchanged from a 2014 CT Abdomen and Pelvis, and no other evidence of malrotation. No pneumoperitoneum, free fluid, or mesenteric inflammation identified. Vascular/Lymphatic: Major arterial and portal venous structures are enhancing and appear to be patent. Normal caliber abdominal aorta. No calcified atherosclerosis or lymphadenopathy identified. Reproductive: Negative. Other: No pelvis free fluid. Musculoskeletal: Chronic lower lumbar disc and endplate degeneration. Mild postoperative changes to the posterior lumbar paraspinal soft tissues. No acute osseous abnormality identified. IMPRESSION: 1. No acute or inflammatory process identified in the abdomen or pelvis. 2. Punctate nephrolithiasis. Electronically Signed   By: VEAR Hurst M.D.   On: 06/18/2024 10:08     Procedures   Medications Ordered in the ED  ondansetron  (ZOFRAN ) injection 4 mg (has no administration in time range)  lactated ringers  bolus 1,000 mL (0 mLs Intravenous Stopped 06/18/24 1152)   iohexol  (OMNIPAQUE ) 300 MG/ML solution 100 mL (100 mLs Intravenous Contrast Given 06/18/24 0928)  acetaminophen  (TYLENOL ) tablet 650 mg (650 mg Oral Given 06/18/24 1124)                                    Medical Decision Making Amount and/or Complexity of Data Reviewed Labs: ordered.    Details: Normal WBC Radiology: ordered and independent interpretation performed.    Details: No diverticulitis  Risk OTC drugs. Prescription drug management.   Patient CT is overall unremarkable.  Sounds like his been having this diarrhea for a while.  Unclear if this is infectious, he already completed a course of azithromycin so I do not think more antibiotics are warranted unless his GI pathogen panel comes back positive.  Otherwise with his stable vitals and benign exam I think is reasonable to have him try supportive care such as Pepto-Bismol and Imodium.  He will need to be called if his pathogen panel is positive.  He does have the rash on his abdomen but this does not seem consistent with zoster and crosses the midline.  I do not think that is related to his weeks of pain.  Will discharge home with return precautions.     Final diagnoses:  Lower abdominal pain    ED Discharge Orders     None          Freddi Hamilton, MD 06/18/24 [phone]

## 2024-06-18 NOTE — Discharge Instructions (Addendum)
 You will be called if your GI pathogen panel for your diarrhea is positive.  Otherwise you may take over-the-counter medicine such as Pepto-Bismol and/or Imodium to help with diarrhea.  If you develop new or worsening abdominal pain, bloody diarrhea, fever, or any other new/concerning symptoms then return to the ER.
